# Patient Record
Sex: Male | Born: 1950 | Hispanic: No | Marital: Single | Smoking: Former smoker
Health system: Southern US, Community
[De-identification: ages and names within clinical notes are randomized; demographics above are authoritative.]

## PROBLEM LIST (undated history)

## (undated) DIAGNOSIS — K2270 Barrett's esophagus without dysplasia: Secondary | ICD-10-CM

## (undated) DIAGNOSIS — N184 Chronic kidney disease, stage 4 (severe): Secondary | ICD-10-CM

## (undated) DIAGNOSIS — K219 Gastro-esophageal reflux disease without esophagitis: Secondary | ICD-10-CM

## (undated) DIAGNOSIS — R609 Edema, unspecified: Secondary | ICD-10-CM

## (undated) DIAGNOSIS — M109 Gout, unspecified: Secondary | ICD-10-CM

## (undated) HISTORY — DX: Chronic kidney disease, stage 4 (severe): N18.4

## (undated) HISTORY — DX: Gout, unspecified: M10.9

---

## 2001-04-22 ENCOUNTER — Ambulatory Visit (HOSPITAL_COMMUNITY): Admission: RE | Admit: 2001-04-22 | Discharge: 2001-04-22 | Payer: Self-pay | Admitting: Gastroenterology

## 2001-04-22 ENCOUNTER — Encounter: Payer: Self-pay | Admitting: Gastroenterology

## 2008-10-28 ENCOUNTER — Emergency Department (HOSPITAL_COMMUNITY): Admission: EM | Admit: 2008-10-28 | Discharge: 2008-10-28 | Payer: Self-pay | Admitting: Family Medicine

## 2008-11-19 ENCOUNTER — Ambulatory Visit: Payer: Self-pay | Admitting: Internal Medicine

## 2008-11-19 ENCOUNTER — Encounter: Payer: Self-pay | Admitting: Internal Medicine

## 2008-11-19 DIAGNOSIS — I10 Essential (primary) hypertension: Secondary | ICD-10-CM

## 2008-11-19 DIAGNOSIS — N186 End stage renal disease: Secondary | ICD-10-CM

## 2008-11-24 LAB — CONVERTED CEMR LAB
ALT: 11 units/L (ref 0–53)
AST: 26 units/L (ref 0–37)
Albumin: 4.8 g/dL (ref 3.5–5.2)
Alkaline Phosphatase: 74 units/L (ref 39–117)
BUN: 27 mg/dL — ABNORMAL HIGH (ref 6–23)
Bacteria, UA: NONE SEEN
Basophils Absolute: 0 10*3/uL (ref 0.0–0.1)
Basophils Relative: 0 % (ref 0–1)
Bilirubin Urine: NEGATIVE
Bilirubin, Direct: <0.1 mg/dL (ref 0.0–0.3)
CO2: 23 meq/L (ref 19–32)
Calcium: 8.7 mg/dL (ref 8.4–10.5)
Chloride: 106 meq/L (ref 96–112)
Cholesterol: 181 mg/dL (ref 0–200)
Creatinine, Ser: 1.87 mg/dL — ABNORMAL HIGH (ref 0.40–1.50)
Eosinophils Absolute: 0.8 10*3/uL — ABNORMAL HIGH (ref 0.0–0.7)
Eosinophils Relative: 10 % — ABNORMAL HIGH (ref 0–5)
Free T4: 1.17 ng/dL (ref 0.80–1.80)
Glucose, Bld: 89 mg/dL (ref 70–99)
HCT: 37 % — ABNORMAL LOW (ref 39.0–52.0)
HDL: 43 mg/dL (ref >39)
Hemoglobin, Urine: NEGATIVE
Hemoglobin: 12.2 g/dL — ABNORMAL LOW (ref 13.0–17.0)
Ketones, ur: NEGATIVE mg/dL
LDL Cholesterol: 109 mg/dL — ABNORMAL HIGH (ref 0–99)
Leukocytes, UA: NEGATIVE
Lymphocytes Relative: 24 % (ref 12–46)
Lymphs Abs: 1.9 10*3/uL (ref 0.7–4.0)
MCHC: 33 g/dL (ref 30.0–36.0)
MCV: 79.7 fL (ref 78.0–100.0)
Monocytes Absolute: 0.5 10*3/uL (ref 0.1–1.0)
Monocytes Relative: 6 % (ref 3–12)
Neutro Abs: 4.7 10*3/uL (ref 1.7–7.7)
Neutrophils Relative %: 60 % (ref 43–77)
Nitrite: NEGATIVE
Phosphorus: 3.4 mg/dL (ref 2.3–4.6)
Platelets: 163 10*3/uL (ref 150–400)
Potassium: 4.8 meq/L (ref 3.5–5.3)
Protein, ur: 100 mg/dL — AB
RBC / HPF: NONE SEEN (ref <3)
RBC: 4.64 M/uL (ref 4.22–5.81)
RDW: 13.8 % (ref 11.5–15.5)
Sodium: 141 meq/L (ref 135–145)
Specific Gravity, Urine: 1.017 (ref 1.005–1.030)
TSH: 2.622 microintl units/mL (ref 0.350–4.500)
Total Bilirubin: 0.3 mg/dL (ref 0.3–1.2)
Total CHOL/HDL Ratio: 4.2
Total Protein: 8.3 g/dL (ref 6.0–8.3)
Triglycerides: 145 mg/dL (ref <150)
Urine Glucose: NEGATIVE mg/dL
Urobilinogen, UA: 0.2 (ref 0.0–1.0)
VLDL: 29 mg/dL (ref 0–40)
WBC, UA: NONE SEEN cells/hpf (ref <3)
WBC: 7.8 10*3/uL (ref 4.0–10.5)
pH: 6 (ref 5.0–8.0)

## 2008-11-25 ENCOUNTER — Ambulatory Visit (HOSPITAL_COMMUNITY): Admission: RE | Admit: 2008-11-25 | Discharge: 2008-11-25 | Payer: Self-pay | Admitting: Internal Medicine

## 2008-12-02 ENCOUNTER — Ambulatory Visit: Payer: Self-pay | Admitting: Internal Medicine

## 2008-12-02 ENCOUNTER — Encounter: Payer: Self-pay | Admitting: Internal Medicine

## 2008-12-03 ENCOUNTER — Encounter: Payer: Self-pay | Admitting: Internal Medicine

## 2008-12-06 LAB — CONVERTED CEMR LAB
BUN: 27 mg/dL — ABNORMAL HIGH (ref 6–23)
CO2: 28 meq/L (ref 19–32)
Calcium: 9.5 mg/dL (ref 8.4–10.5)
Chloride: 104 meq/L (ref 96–112)
Creatinine, Ser: 2 mg/dL — ABNORMAL HIGH (ref 0.40–1.50)
Glucose, Bld: 92 mg/dL (ref 70–99)
Potassium: 4.8 meq/L (ref 3.5–5.3)
Sed Rate: 77 mm/hr — ABNORMAL HIGH (ref 0–16)
Sodium: 142 meq/L (ref 135–145)
Uric Acid, Serum: 9.9 mg/dL — ABNORMAL HIGH (ref 4.0–7.8)

## 2009-01-13 ENCOUNTER — Ambulatory Visit: Payer: Self-pay | Admitting: Internal Medicine

## 2009-01-13 LAB — CONVERTED CEMR LAB
BUN: 40 mg/dL — ABNORMAL HIGH (ref 6–23)
CO2: 24 meq/L (ref 19–32)
Calcium: 9.3 mg/dL (ref 8.4–10.5)
Chloride: 105 meq/L (ref 96–112)
Creatinine, Ser: 2.05 mg/dL — ABNORMAL HIGH (ref 0.40–1.50)
Glucose, Bld: 86 mg/dL (ref 70–99)
Potassium: 4.4 meq/L (ref 3.5–5.3)
Sodium: 140 meq/L (ref 135–145)

## 2009-01-18 ENCOUNTER — Encounter: Payer: Self-pay | Admitting: Internal Medicine

## 2009-01-18 DIAGNOSIS — K029 Dental caries, unspecified: Secondary | ICD-10-CM | POA: Insufficient documentation

## 2009-02-10 ENCOUNTER — Ambulatory Visit: Payer: Self-pay | Admitting: Internal Medicine

## 2009-02-10 LAB — CONVERTED CEMR LAB

## 2009-02-16 ENCOUNTER — Ambulatory Visit: Payer: Self-pay | Admitting: Internal Medicine

## 2009-02-16 LAB — CONVERTED CEMR LAB
OCCULT 1: NEGATIVE
OCCULT 2: NEGATIVE
OCCULT 3: NEGATIVE

## 2009-02-18 ENCOUNTER — Encounter: Payer: Self-pay | Admitting: Internal Medicine

## 2009-02-18 LAB — CONVERTED CEMR LAB
BUN: 35 mg/dL — ABNORMAL HIGH (ref 6–23)
CO2: 31 meq/L (ref 19–32)
Calcium: 9.7 mg/dL (ref 8.4–10.5)
Chloride: 99 meq/L (ref 96–112)
Creatinine, Ser: 2.23 mg/dL — ABNORMAL HIGH (ref 0.40–1.50)
Glucose, Bld: 87 mg/dL (ref 70–99)
PSA: 0.59 ng/mL (ref 0.10–4.00)
Potassium: 4.4 meq/L (ref 3.5–5.3)
Sodium: 141 meq/L (ref 135–145)

## 2009-03-02 ENCOUNTER — Telehealth (INDEPENDENT_AMBULATORY_CARE_PROVIDER_SITE_OTHER): Payer: Self-pay | Admitting: *Deleted

## 2009-05-04 ENCOUNTER — Ambulatory Visit: Payer: Self-pay | Admitting: Infectious Diseases

## 2009-05-04 DIAGNOSIS — M109 Gout, unspecified: Secondary | ICD-10-CM

## 2009-05-04 DIAGNOSIS — M1030 Gout due to renal impairment, unspecified site: Secondary | ICD-10-CM | POA: Insufficient documentation

## 2009-05-04 HISTORY — DX: Gout, unspecified: M10.9

## 2009-05-04 LAB — CONVERTED CEMR LAB
Albumin: 4.1 g/dL (ref 3.5–5.2)
BUN: 34 mg/dL — ABNORMAL HIGH (ref 6–23)
CO2: 21 meq/L (ref 19–32)
Calcium: 8.9 mg/dL (ref 8.4–10.5)
Chloride: 110 meq/L (ref 96–112)
Creatinine, Ser: 2.41 mg/dL — ABNORMAL HIGH (ref 0.40–1.50)
Glucose, Bld: 97 mg/dL (ref 70–99)
Phosphorus: 2.6 mg/dL (ref 2.3–4.6)
Potassium: 4.4 meq/L (ref 3.5–5.3)
Sodium: 143 meq/L (ref 135–145)
Uric Acid, Serum: 5.9 mg/dL (ref 4.0–7.8)

## 2009-07-16 ENCOUNTER — Ambulatory Visit: Payer: Self-pay | Admitting: Internal Medicine

## 2009-07-21 LAB — CONVERTED CEMR LAB
ALT: <8 units/L (ref 0–53)
AST: 16 units/L (ref 0–37)
Albumin: 4.2 g/dL (ref 3.5–5.2)
Alkaline Phosphatase: 81 units/L (ref 39–117)
BUN: 27 mg/dL — ABNORMAL HIGH (ref 6–23)
CO2: 26 meq/L (ref 19–32)
Calcium: 8.7 mg/dL (ref 8.4–10.5)
Chloride: 100 meq/L (ref 96–112)
Creatinine, Ser: 1.8 mg/dL — ABNORMAL HIGH (ref 0.40–1.50)
Glucose, Bld: 84 mg/dL (ref 70–99)
Potassium: 4.3 meq/L (ref 3.5–5.3)
Sodium: 139 meq/L (ref 135–145)
Total Bilirubin: 0.4 mg/dL (ref 0.3–1.2)
Total Protein: 8 g/dL (ref 6.0–8.3)
Uric Acid, Serum: 6.5 mg/dL (ref 4.0–7.8)

## 2009-11-24 ENCOUNTER — Ambulatory Visit: Payer: Self-pay | Admitting: Internal Medicine

## 2009-11-25 ENCOUNTER — Encounter: Payer: Self-pay | Admitting: Internal Medicine

## 2009-11-25 LAB — CONVERTED CEMR LAB
Albumin: 4.4 g/dL (ref 3.5–5.2)
CO2: 23 meq/L (ref 19–32)
Calcium: 8.9 mg/dL (ref 8.4–10.5)
Glucose, Bld: 87 mg/dL (ref 70–99)
Potassium: 4.4 meq/L (ref 3.5–5.3)
Sodium: 143 meq/L (ref 135–145)
Total Protein: 8 g/dL (ref 6.0–8.3)

## 2009-12-02 ENCOUNTER — Telehealth: Payer: Self-pay | Admitting: Internal Medicine

## 2010-03-24 ENCOUNTER — Telehealth: Payer: Self-pay | Admitting: Internal Medicine

## 2010-03-29 ENCOUNTER — Ambulatory Visit: Payer: Self-pay | Admitting: Internal Medicine

## 2010-03-29 DIAGNOSIS — M25569 Pain in unspecified knee: Secondary | ICD-10-CM

## 2010-03-29 DIAGNOSIS — R609 Edema, unspecified: Secondary | ICD-10-CM

## 2010-03-30 ENCOUNTER — Encounter: Payer: Self-pay | Admitting: Internal Medicine

## 2010-04-04 LAB — CONVERTED CEMR LAB
Potassium: 4.6 meq/L (ref 3.5–5.3)
Sodium: 141 meq/L (ref 135–145)

## 2010-04-08 ENCOUNTER — Ambulatory Visit: Payer: Self-pay | Admitting: Internal Medicine

## 2010-04-08 LAB — CONVERTED CEMR LAB
CO2: 29 meq/L (ref 19–32)
Glucose, Bld: 84 mg/dL (ref 70–99)
Potassium: 4.3 meq/L (ref 3.5–5.3)
Sodium: 141 meq/L (ref 135–145)

## 2010-04-11 ENCOUNTER — Telehealth: Payer: Self-pay | Admitting: Ophthalmology

## 2010-05-11 ENCOUNTER — Encounter: Payer: Self-pay | Admitting: Ophthalmology

## 2010-05-18 ENCOUNTER — Ambulatory Visit: Payer: Self-pay | Admitting: Internal Medicine

## 2010-05-18 LAB — CONVERTED CEMR LAB
CO2: 31 meq/L (ref 19–32)
Calcium: 9.4 mg/dL (ref 8.4–10.5)
Creatinine, Ser: 2.19 mg/dL — ABNORMAL HIGH (ref 0.40–1.50)
Glucose, Bld: 92 mg/dL (ref 70–99)
Sodium: 140 meq/L (ref 135–145)

## 2010-06-21 NOTE — Assessment & Plan Note (Signed)
Summary: from [place]. nurse, BP up, BLE edema 1 to 2 +/pcp-madera/hla   Vital Signs:  Patient profile:   60 year old male Height:      64 inches (162.56 cm) Weight:      193.3 pounds (85.50 kg) BMI:     33.30 Temp:     97.4 degrees F (36.33 degrees C) oral Pulse rate:   83 / minute BP sitting:   141 / 82  (right arm) Cuff size:   regular  Vitals Entered By: Lucky Rathke NT II (March 29, 2010 4:09 PM) CC: PATIENT SPEAKS NO ENGLISH / PAIN IN RIGHT KNEE/ LE edema Is Patient Diabetic? No Pain Assessment Patient in pain? yes     Location: RIGHT KNEE Intensity:        3 Type: burning Onset of pain  FOR ABOUT 10 DAYS Nutritional Status BMI of > 30 = obese  Have you ever been in a relationship where you felt threatened, hurt or afraid?No   Does patient need assistance? Functional Status Self care Ambulation Normal   Primary Care Provider:  Barton Dubois MD  CC:  PATIENT SPEAKS NO ENGLISH / PAIN IN RIGHT KNEE/ LE edema.  History of Present Illness: This is a 60 year old [place] male with a history of HTN, renal insufficiency and gout who presents because his congregational nurse has noted elevated BP with BLE edema 1-2+.  The patient has had a slowly progressive increase in LE edema over the last 2-3 months and states that he has been taking his BP medications regularly and has not missed any doses.  Pt also denies a high salt diet.  The LE edema is bilateral and equal and Luke Dunn has not noted any redness or temperature differences between the legs.    Over about the same time period, the pt has also noted increased pain in his right knee.  The pain is constant, discribed as a 3/10 and is worsened by walking.  The pain is not worse at any time of day.  The patient denies any known trauma and has not noted erythema or edema of the joint.     Luke Dunn states that he has had HTN for the last 1.5 years and it has been poorly controlled.  The pts BP has ranged from the 123XX123 -  123XX123 systolic since January.  Elevated BP's have been attributed to poor adherence in the past.  Of note, the pts nurse does not take his bp after he has had time to rest and at his last elevated value (sbp approx: 160)  had taken the BP after he finished walking up a flight of stairs.   The patient denies any CP, SOB, chills, fever, weight change, or change in his BM's.        Preventive Screening-Counseling & Management  Alcohol-Tobacco     Alcohol drinks/day: 0     Smoking Status: quit  about 5 years ago.     Packs/Day: 1 pack per day.     Year Started: smoked as a teenager  Caffeine-Diet-Exercise     Does Patient Exercise: no  Problems Prior to Update: 1)  Gout  (ICD-274.9) 2)  Preventive Health Care  (ICD-V70.0) 3)  Dental Caries  (ICD-521.00) 4)  Foot Pain, Right  (ICD-729.5) 5)  Renal Insufficiency  (ICD-588.9) 6)  Essential Hypertension, Benign  (ICD-401.1)  Allergies: No Known Drug Allergies  Family History: Reviewed history from 11/24/2009 and no changes required. Unknown  Social History:  Reviewed history from 11/24/2009 and no changes required. 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   Physical Exam  General:  alert, well-developed, and well-hydrated.   Repeat BP: 112/62 Head:  normocephalic and atraumatic.   Eyes:  vision grossly intact, pupils equal, pupils round, and pupils reactive to light.   Mouth:  pharynx pink and moist.   Poor dentition.  Neck:  supple.   Lungs:  normal respiratory effort, normal breath sounds, no crackles, and no wheezes.   Heart:  normal rate, regular rhythm, no murmur, no gallop, no rub, and no JVD.   Abdomen:  soft, non-tender, normal bowel sounds, and no distention.   Msk:  There is diffuse mild joint tenderness along the joint margin of the right knee.   The knee has Full range of motion both activly and passivly.  There is no redness or effusion.  Pulses:  2+ dp/pt pulses bilaterally.  Extremities:  2+ pitting edema in the lower extremities bilaterally.  There is no erythema or increased warmth.  Skin:  No rashes.    Impression & Recommendations:  Problem # 1:  ESSENTIAL HYPERTENSION, BENIGN (ICD-401.1) Given that norvasc can cause/worsen LE edema I will discontinue it at this time and will initiate treatment of his BP with Coreg 12.5 mg two times a day.  I will also change his HCTZ to Lasix 20 mg daily to provide increased diuresis.  The patient will be seen back in one week for a nurse draw to check a BMET and we will follow up for a BP check in one month.   The following medications were removed from the medication list:    Amlodipine Besylate 10 Mg Tabs (Amlodipine besylate) .Marland Kitchen... Take 1 tablet by mouth once a day    Hydrochlorothiazide 50 Mg Tabs (Hydrochlorothiazide) .Marland Kitchen... Take 1 tablet by mouth every morning His updated medication list for this problem includes:    Coreg 12.5 Mg Tabs (Carvedilol) .Marland Kitchen... Take 1 tablet by mouth two times a day    Lasix 20 Mg Tabs (Furosemide) .Marland Kitchen... Take 1 tablet by mouth once a day  Problem # 2:  LEG EDEMA, BILATERAL (ICD-782.3) Pt has gained 5 ibs since July. The differential dx would include volume overload secondary to renal insufficiency, medication effect, chf (of which pt does not have  a  dx), or hypoalbuminemic states which seems unlikely at this time.  Will check a BMET today to rule out renal causes.   The patient has also been instructed to elevate his legas and wear compression stockings in AM.  If pts LE edema continues would recommend checking a BNP and an echo to check for diastolic dysfunction.  The following medications were removed from the medication list:    Hydrochlorothiazide 50 Mg Tabs (Hydrochlorothiazide) .Marland Kitchen... Take 1 tablet by mouth every morning His updated medication list for  this problem includes:    Lasix 20 Mg Tabs (Furosemide) .Marland Kitchen... Take 1 tablet by mouth once a day  Problem # 3:  RENAL INSUFFICIENCY (ICD-588.9) Likely secondary to hypertension, BMET last checked in july and creatinine was 1.71.  This is improved over his typical baseline.  Will recheck BMET today.   Orders: T-Basic Metabolic Panel (99991111)  Problem # 4:  KNEE PAIN, RIGHT (ICD-719.46)  This could be secondary to gout vs.  osteoarthritis.  At this point there are no warning signs and the pt is not in significant pain.  I do not feel that an x ray is warranted at this time.  We will treat with pain control using Voltaren jell which the pt has been instructed to use twice daily.  The patient was also instructed to contact us before taking any OTC pain medications.  Will follow up on pts knee pain in one month.    His updated medication list for this problem includes:    Tramadol Hcl 50 Mg Tabs (Tramadol hcl) .Marland Kitchen... Take one tablet by mouth every 8 horus as needed for pain  Problem # 5:  Preventive Health Care (ICD-V70.0)  Pt recieved flu shot today as well as a dental referal.   Orders: Dental Referral (Dentist) Flu Vaccine 28yrs + MEDICARE PATIENTS [id number])  Orders: Dental Referral (Dentist) Flu Vaccine 76yrs + MEDICARE PATIENTS [id number])  Complete Medication List: 1)  Tramadol Hcl 50 Mg Tabs (Tramadol hcl) .... Take one tablet by mouth every 8 horus as needed for pain 2)  Allopurinol 100 Mg Tabs (Allopurinol) .... Take 1 1/2 tablet by mouth once a day 3)  Coreg 12.5 Mg Tabs (Carvedilol) .... Take 1 tablet by mouth two times a day 4)  Lasix 20 Mg Tabs (Furosemide) .... Take 1 tablet by mouth once a day 5)  Voltaren 1 % Gel (Diclofenac sodium) .... Apply to knee two times daily for pain.  Other Orders: Influenza Vaccine NON MCR [id number])  Patient Instructions: 1)  Please do not take any over the counter pain medications without calling us.  We will see you back in 1 week so that we can  check your potassium and in 1 month to recheck your BP and check on your knee.  For your knee pain please use the Voltaren jell.  Please avoid salty foods.  Prescriptions: VOLTAREN 1 % GEL (DICLOFENAC SODIUM) apply to knee two times daily for pain.  #1 tube x 3   Entered and Authorized by:   Jola Schmidt MD   Signed by:   Jola Schmidt MD on 03/29/2010   Method used:   Print then Give to Patient   RxID:   [id number] LASIX 20 MG TABS (FUROSEMIDE) Take 1 tablet by mouth once a day  #30 x 2   Entered and Authorized by:   Jola Schmidt MD   Signed by:   Jola Schmidt MD on 03/29/2010   Method used:   Print then Give to Patient   RxID:   [id number] COREG 12.5 MG TABS (CARVEDILOL) Take 1 tablet by mouth two times a day  #60 x 2   Entered and Authorized by:   Jola Schmidt MD   Signed by:   Jola Schmidt MD on 03/29/2010   Method used:   Print then Give to Patient   RxID:   [id number]    Orders Added: 1)  T-Basic Metabolic Panel 0000000 2)  Est. Patient Level III [id number] 3)  Dental Referral [Dentist] 4)  Flu Vaccine 21yrs + MEDICARE PATIENTS [Q2039] 5)  Influenza Vaccine NON MCR [id number]   Flu Vaccine Consent Questions:    Do you have a history of severe allergic reactions to this vaccine? no    Any prior history of allergic reactions to egg and/or gelatin? no    Do you have a sensitivity to the preservative Thimersol? no    Do you have a past history of Guillan-Barre Syndrome? no    Do  you currently have an acute febrile illness? no    Have you ever had a severe reaction to latex? no    Vaccine information given and explained to patient? yes   Influenza Vaccine (to be given today) Process Orders Check Orders Results:     [facility]: ABN not required for this insurance Tests Sent for requisitioning (March 29, 2010 5:29 PM):     03/29/2010: [facility] -- T-Basic Metabolic Panel 0000000 (signed)    Prevention &  Chronic Care Immunizations   Influenza vaccine: Fluvax Non-MCR  (03/29/2010)   Influenza vaccine due: 01/20/2010    Tetanus booster: 05/04/2009: Tdap   Td booster deferral: Deferred  (02/10/2009)   Tetanus booster due: 05/05/2019    Pneumococcal vaccine: Not documented   Pneumococcal vaccine deferral: Deferred  (02/10/2009)    H. zoster vaccine: Not documented   H. zoster vaccine deferral: Deferred  (02/10/2009)  Colorectal Screening   Hemoccult: Not documented   Hemoccult action/deferral: Ordered  (02/10/2009)    Colonoscopy: Not documented  Other Screening   PSA: 0.59  (02/10/2009)   PSA action/deferral: Discussed-PSA requested  (02/10/2009)   Smoking status: quit  about 5 years ago.  (03/29/2010)  Lipids   Total Cholesterol: 181  (11/19/2008)   LDL: 109  (11/19/2008)   LDL Direct: Not documented   HDL: 43  (11/19/2008)   Triglycerides: 145  (11/19/2008)  Hypertension   Last Blood Pressure: 141 / 82  (03/29/2010)   Serum creatinine: 1.71  (11/25/2009)   BMP action: Ordered   Serum potassium 4.4  (11/25/2009)  Self-Management Support :   Personal Goals (by the next clinic visit) :      Personal blood pressure goal: 140/90  (11/24/2009)   Patient will work on the following items until the next clinic visit to reach self-care goals:     Medications and monitoring: take my medicines every day, bring all of my medications to every visit  (03/29/2010)     Eating: drink diet soda or water instead of juice or soda, eat more vegetables, use fresh or frozen vegetables, eat foods that are low in salt, eat baked foods instead of fried foods, eat fruit for snacks and desserts, limit or avoid alcohol  (03/29/2010)     Activity: take a 30 minute walk every day, take the stairs instead of the elevator  (03/29/2010)    Hypertension self-management support: Resources for patients handout  (03/29/2010)      Resource handout printed.

## 2010-06-21 NOTE — Progress Notes (Signed)
Summary: Results/B/P check  Phone Note Other Incoming   Caller: Berneta Sages Summary of Call: Call from the [facility] with pt's B/P.  Pt had not at his last visit been teen his B/P medication.  B/P was ordered.  Reading was 140/102 pt has been back on the B/P medication for 5 days now.  Skipped last "Sunday. Caolyn O'Brian can be reached at [phone] for results of labs. Initial call taken by: Gladys Herbin RN,  December 02, 2009 2:11 PM  Follow-up for Phone Call        Phone call completed. Increase HCTZ to 50 mg by mouth daily. Spoke with the patient's nurse Ms. O'Brian.She is to call with any concerns or questions. Follow-up by: Jameson Tormey MD,  December 07, 2009 3:07 PM    New/Updated Medications: HYDROCHLOROTHIAZIDE 25 MG TABS (HYDROCHLOROTHIAZIDE) Take 1 tablet by mouth every morning HYDROCHLOROTHIAZIDE 50 MG TABS (HYDROCHLOROTHIAZIDE) Take 1 tablet by mouth every morning Prescriptions: HYDROCHLOROTHIAZIDE 50 MG TABS (HYDROCHLOROTHIAZIDE) Take 1 tablet by mouth every morning  #30 x 11   Entered and Authorized by:   Alahna Dunne MD   Signed by:   Constantin Hillery MD on 12/07/2009   Method used:   Electronically to        [facility] (retail)       [address]       [place], [place]  [id number]       Ph: [phone]       Fax: [phone]   RxID:   [id number] HYDROCHLOROTHIAZIDE 25 MG TABS (HYDROCHLOROTHIAZIDE) Take 1 tablet by mouth every morning  #30 x 11   Entered and Authorized by:   Laniece Hornbaker MD   Signed by:   Bunyan Brier MD on 12/04/2009   Method used:   Electronically to        [facility] (retail)       [address], [place]  [id number]       Ph: [phone]       Fax: [phone]   RxID:   [id number]

## 2010-06-21 NOTE — Progress Notes (Signed)
Summary: appt/ hla  Phone Note Other Incoming   Summary of Call: per carolyn, congregational nurse, pt is having elevated bp's and BLE edema, appt is made for 11/8 per nurse request to make arrangments for pt being out of work , transportation and interpreter. carolyn will accompany pt. Initial call taken by: Freddy Finner RN,  March 24, 2010 2:53 PM  Follow-up for Phone Call        Thanks, for the info. Patient need to have his blood drawn for BMET in order to check his kidney function and electrolytes; will also need to have adjustment of his medications depending BP level and kidney fx.

## 2010-06-21 NOTE — Assessment & Plan Note (Signed)
Summary: acute-called Luke Dunn for inter/cfb   Vital Signs:  Patient profile:   60 year old male Height:      64 inches (162.56 cm) Weight:      192.1 pounds (87.32 kg) BMI:     33.09 Temp:     98.1 degrees F (36.72 degrees C) oral Pulse rate:   86 / minute BP sitting:   155 / 98  (right arm)  Vitals Entered By: Hilda Blades Ditzler RN (July 16, 2009 11:01 AM) Is Patient Diabetic? No Pain Assessment Patient in pain? no      Nutritional Status BMI of > 30 = obese Nutritional Status Detail appetite good  Have you ever been in a relationship where you felt threatened, hurt or afraid?denies   Does patient need assistance? Functional Status Self care Ambulation Normal Comments FU BP med and labs. Interpreter and sponsor with pt.   Primary Care Provider:  Barton Dubois MD   History of Present Illness: 60 year old male with pmh as described on the EMR. Who comes to the clinic for followup of his HTN, renal insufficiency and gout.  Pt reports doing well, denies any complaints or any recent swelling/pain of his joints. He is mostly everyday compliant with his medications and denies any major side effects from them. He had days that he missed his medications, but he admitted that he do not take his medications at the same time.  BP is 155/98  Depression History:      The patient denies a depressed mood most of the day and a diminished interest in his usual daily activities.        The patient denies that he feels like life is not worth living, denies that he wishes that he were dead, and denies that he has thought about ending his life.         Preventive Screening-Counseling & Management  Alcohol-Tobacco     Alcohol drinks/day: 0     Smoking Status: quit  about 5 years ago.     Packs/Day: 1 pack per day.     Year Started: smoked as a teenager  Caffeine-Diet-Exercise     Does Patient Exercise: no  Problems Prior to Update: 1)  Gout  (ICD-274.9) 2)  Preventive Health  Care  (ICD-V70.0) 3)  Dental Caries  (ICD-521.00) 4)  Foot Pain, Right  (ICD-729.5) 5)  Renal Insufficiency  (ICD-588.9) 6)  Essential Hypertension, Benign  (ICD-401.1)  Current Problems (verified): 1)  Gout  (ICD-274.9) 2)  Preventive Health Care  (ICD-V70.0) 3)  Dental Caries  (ICD-521.00) 4)  Foot Pain, Right  (ICD-729.5) 5)  Renal Insufficiency  (ICD-588.9) 6)  Essential Hypertension, Benign  (ICD-401.1)  Medications Prior to Update: 1)  Amlodipine Besylate 10 Mg Tabs (Amlodipine Besylate) .... Take 1 Tablet By Mouth Once A Day 2)  Tramadol Hcl 50 Mg Tabs (Tramadol Hcl) .... Take One Tablet By Mouth Every 8 Horus As Needed For Pain 3)  Uloric 40 Mg Tabs (Febuxostat) .... Take 1 Tablet By Mouth Once A Day 4)  Allopurinol 100 Mg Tabs (Allopurinol) .... Take 1 Tablet By Mouth Once A Day  Current Medications (verified): 1)  Amlodipine Besylate 10 Mg Tabs (Amlodipine Besylate) .... Take 1 Tablet By Mouth Once A Day 2)  Tramadol Hcl 50 Mg Tabs (Tramadol Hcl) .... Take One Tablet By Mouth Every 8 Horus As Needed For Pain 3)  Allopurinol 100 Mg Tabs (Allopurinol) .... Take 1 Tablet By Mouth Once A Day  Allergies (verified):  No Known Drug Allergies  Past History:  Risk Factors: Alcohol Use: 0 (07/16/2009) Exercise: no (07/16/2009)  Risk Factors: Smoking Status: quit  about 5 years ago. (07/16/2009) Packs/Day: 1 pack per day. (07/16/2009)  Review of Systems  The patient denies anorexia, fever, chest pain, syncope, dyspnea on exertion, prolonged cough, headaches, hemoptysis, abdominal pain, melena, hematochezia, severe indigestion/heartburn, hematuria, incontinence, and muscle weakness.    Physical Exam  General:  alert, well-developed, and overweight-appearing. In no acute distress.  Lungs:  Normal respiratory effort, chest expands symmetrically. Lungs are clear to auscultation, no crackles or wheezes. Heart:  Normal rate and regular rhythm. S1 and S2 normal without gallop,  murmur, click, rub or other extra sounds. Abdomen:  Bowel sounds positive,abdomen soft and non-tender without masses, organomegaly or hernias noted. Msk:  normal ROM, no joint tenderness, no joint swelling, no redness over joints, and no crepitation.   Extremities:  trace of edema bilateral, good pulses, no cyanosis and no tenderness. Neurologic:  No cranial nerve deficits noted. Station and gait are normal. Sensory, motor and coordinative functions appear intact.   Impression & Recommendations:  Problem # 1:  GOUT (ICD-274.9) Uric acid 6.5 now; goal is to achieved uric acid level of less than 6. Will increased his allopurinol to 150mg  daily and will discontinue his uloric (patient unable to pay for it anyway). Will continue using tramadol as needed for pain. Patient advised of food that can make him flare. Cr was 1.8; patient advised to keep himself hydrated.  The following medications were removed from the medication list:    Uloric 40 Mg Tabs (Febuxostat) .Marland Kitchen... Take 1 tablet by mouth once a day His updated medication list for this problem includes:    Allopurinol 100 Mg Tabs (Allopurinol) .Marland Kitchen... Take 1 tablet by mouth once a day  Orders: T-Uric Acid (Blood) [id number])  Problem # 2:  ESSENTIAL HYPERTENSION, BENIGN (ICD-401.1) BP essentially at goal. Will continue current regimen and will advised him to follow a low sodium diet and to be compliant with his medications everyday and also to try to take it at the same time (in order to minimize BP fluctuation).  His updated medication list for this problem includes:    Amlodipine Besylate 10 Mg Tabs (Amlodipine besylate) .Marland Kitchen... Take 1 tablet by mouth once a day  Orders: T-Comprehensive Metabolic Panel ([id number])  Problem # 3:  DENTAL CARIES (ICD-521.00) Patient with really bad dental caries. We have instructed to brush his teeth properly, to use peroxide mouthwash and have give information of dental clinic in [place] in order to see  if he received early attention. Patient was placed in list for dental referral in [place] as well (but waiting list is big). There is not active or acute infection at this point.  Problem # 4:  RENAL INSUFFICIENCY (ICD-588.9) Cr slowly improving. Will continue controlling his BP, will keep patient hydrated and will monitorzed his renal function closely. Patient advised to follow a low sodium and low protein diet.  Complete Medication List: 1)  Amlodipine Besylate 10 Mg Tabs (Amlodipine besylate) .... Take 1 tablet by mouth once a day 2)  Tramadol Hcl 50 Mg Tabs (Tramadol hcl) .... Take one tablet by mouth every 8 horus as needed for pain 3)  Allopurinol 100 Mg Tabs (Allopurinol) .... Take 1 tablet by mouth once a day  Patient Instructions: 1)  Please schedule a follow-up appointment in 4 months. 2)  Take your medications as prescribed and if possible at the sametime everyday. 3)  Keep yourself hydrated. 4)  To prevent gout attacks,avoid purine rich foods, such as beer, beans & peas, and meat gravies. 5)  You will be called with any abnormalities in the tests scheduled or performed today.  6)  Limit your Sodium to less than 2500 grams a day (slightly less than 1/2  tablespoon) to helpcontrolling your blood pressure. Process Orders Check Orders Results:     [facility]: [id number] not required for this insurance Tests Sent for requisitioning (July 21, 2009 3:28 PM):     07/16/2009: [facility] -- T-Comprehensive Metabolic Panel [id number] (signed)     07/16/2009: [facility] -- T-Uric Acid (Blood) [id number] (signed)    Prevention & Chronic Care Immunizations   Influenza vaccine: Fluvax Non-MCR  (02/10/2009)   Influenza vaccine due: 01/20/2010    Tetanus booster: 05/04/2009: Tdap   Td booster deferral: Deferred  (02/10/2009)   Tetanus booster due: 05/05/2019    Pneumococcal vaccine: Not documented   Pneumococcal vaccine deferral: Deferred   (02/10/2009)    H. zoster vaccine: Not documented   H. zoster vaccine deferral: Deferred  (02/10/2009)  Colorectal Screening   Hemoccult: Not documented   Hemoccult action/deferral: Ordered  (02/10/2009)    Colonoscopy: Not documented  Other Screening   PSA: 0.59  (02/10/2009)   PSA action/deferral: Discussed-PSA requested  (02/10/2009)   Smoking status: quit  about 5 years ago.  (07/16/2009)  Lipids   Total Cholesterol: 181  (11/19/2008)   LDL: 109  (11/19/2008)   LDL Direct: Not documented   HDL: 43  (11/19/2008)   Triglycerides: 145  (11/19/2008)  Hypertension   Last Blood Pressure: 155 / 98  (07/16/2009)   Serum creatinine: 2.41  (05/04/2009)   BMP action: Ordered   Serum potassium 4.4  (05/04/2009) CMP ordered   Self-Management Support :    Patient will work on the following items until the next clinic visit to reach self-care goals:     Medications and monitoring: take my medicines every day, check my blood pressure, bring all of my medications to every visit, weigh myself weekly  (07/16/2009)     Eating: drink diet soda or water instead of juice or soda, eat more vegetables, use fresh or frozen vegetables, eat foods that are low in salt, eat baked foods instead of fried foods, eat fruit for snacks and desserts, limit or avoid alcohol  (07/16/2009)     Activity: take a 30 minute walk every day, take the stairs instead of the elevator  (07/16/2009)    Hypertension self-management support: Education handout, Written self-care plan, Resources for patients handout  (07/16/2009)   Hypertension self-care plan printed.   Hypertension education handout printed      Resource handout printed.

## 2010-06-21 NOTE — Assessment & Plan Note (Signed)
Summary: EST-CK/FU/MEDS/CFB CALLED [facility]ANN[facility] FOR [facility] INTER/CFB   Vital Signs:  Patient profile:   60 year old male Height:      64 inches (162.56 cm) Weight:      188.1 pounds (85.50 kg) BMI:     32.40 Temp:     97.3 degrees F (36.28 degrees C) oral Pulse rate:   79 / minute BP sitting:   184 / 107  (right arm)  Vitals Entered By: Hilda Blades Ditzler RN (November 24, 2009 4:39 PM) Is Patient Diabetic? No Pain Assessment Patient in pain? yes     Location: right foot Intensity: 8 Type: sharp Onset of pain  past week Nutritional Status BMI of > 30 = obese Nutritional Status Detail appetite good  Have you ever been in a relationship where you felt threatened, hurt or afraid?denies   Does patient need assistance? Functional Status Self care Ambulation Normal Comments Interpreter with pt. Out of meds x 2 months. Refills on meds.   Primary Care Provider:  Barton Dubois MD   History of Present Illness: Follow up for his: 1.HTN - out of his meds for 2 months. 2.R giht foot 2nd MT joint pain 2/2 gout.  Depression History:      The patient denies a depressed mood most of the day and a diminished interest in his usual daily activities.         Preventive Screening-Counseling & Management  Alcohol-Tobacco     Alcohol drinks/day: 0     Smoking Status: quit  about 5 years ago.     Packs/Day: 1 pack per day.     Year Started: smoked as a teenager  Caffeine-Diet-Exercise     Does Patient Exercise: no  Problems Prior to Update: 1)  Gout  (ICD-274.9) 2)  Preventive Health Care  (ICD-V70.0) 3)  Dental Caries  (ICD-521.00) 4)  Foot Pain, Right  (ICD-729.5) 5)  Renal Insufficiency  (ICD-588.9) 6)  Essential Hypertension, Benign  (ICD-401.1)  Current Problems (verified): 1)  Gout  (ICD-274.9) 2)  Preventive Health Care  (ICD-V70.0) 3)  Dental Caries  (ICD-521.00) 4)  Foot Pain, Right  (ICD-729.5) 5)  Renal Insufficiency  (ICD-588.9) 6)  Essential Hypertension, Benign   (ICD-401.1)  Medications Prior to Update: 1)  Amlodipine Besylate 10 Mg Tabs (Amlodipine Besylate) .... Take 1 Tablet By Mouth Once A Day 2)  Tramadol Hcl 50 Mg Tabs (Tramadol Hcl) .... Take One Tablet By Mouth Every 8 Horus As Needed For Pain 3)  Allopurinol 100 Mg Tabs (Allopurinol) .... Take 1 1/2 Tablet By Mouth Once A Day  Allergies (verified): No Known Drug Allergies  Past History:  Risk Factors: Smoking Status: quit  about 5 years ago. (11/24/2009) Packs/Day: 1 pack per day. (11/24/2009)  Past medical, surgical, family and social histories (including risk factors) reviewed for relevance to current acute and chronic problems.  Family History: Reviewed history and no changes required. Unknown  Social History: Reviewed history and no changes required. 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 No apparent alopecia or balding. Eyes:  No corneal or conjunctival inflammation noted. EOMI. Perrla. Funduscopic exam benign, without hemorrhages, exudates or papilledema. Vision grossly normal. Ears:  External ear  exam shows no significant lesions or deformities.  Otoscopic examination reveals clear canals, tympanic membranes are intact bilaterally without bulging, retraction, inflammation or discharge. Hearing is grossly normal bilaterally. Nose:  External nasal examination shows no deformity or inflammation. Nasal mucosa are pink and moist without lesions or exudates. Mouth:  Oral mucosa and oropharynx without lesions or exudates.  Teeth in good repair. Neck:  No deformities, masses, or tenderness noted. Lungs:  Normal respiratory  effort, chest expands symmetrically. Lungs are clear to auscultation, no crackles or wheezes. Heart:  Normal rate and regular rhythm. S1 and S2 normal without gallop, murmur, click, rub or other extra sounds. Abdomen:  Bowel sounds positive,abdomen soft and non-tender without masses, organomegaly or hernias noted. Rectal:  declined Genitalia:  declinedd Prostate:  declined Msk:  R foot 1st MP joint erythmea, edema and increased warmth with touch; TTP. No abnormalities to other joints. Pulses:  R and L carotid,radial,femoral,dorsalis pedis and posterior tibial pulses are full and equal bilaterally Extremities:  No clubbing, cyanosis, edema, or deformity noted with normal full range of motion of all joints.   Neurologic:  No cranial nerve deficits noted. Station and gait are normal. Plantar reflexes are down-going bilaterally. DTRs are symmetrical throughout. Sensory, motor and coordinative functions appear intact. Skin:  Intact without suspicious lesions or rashes Cervical Nodes:  No lymphadenopathy noted Axillary Nodes:  No palpable lymphadenopathy Inguinal Nodes:  no R inguinal adenopathy and no L inguinal adenopathy.   Psych:  Cognition and judgment appear intact. Alert and cooperative with normal attention span and concentration. No apparent delusions, illusions, hallucinations   Impression & Recommendations:  Problem # 1:  GOUT (ICD-274.9)  Patient is instructed to restart allopurinol ONLY after an acute flare up is subsided. Prednisone taper for gout. Avoid NSAIDs due to CRI. May consider Colchicine. His updated medication list for this problem includes:    Allopurinol 100 Mg Tabs (Allopurinol) .Marland Kitchen... Take 1 1/2 tablet by mouth once a day  Elevate extremity; warm compresses, symptomatic relief and medication as directed.   Problem # 2:  ESSENTIAL HYPERTENSION, BENIGN (ICD-401.1) Uncontrolled due to noncompliance. Risks of HTN thoroughly discussed -patient verbalized understanding.  Restart Amlodipine. RTC in 3 days for BP recheck. B-met repeated -Cr is at 1.71 (no change since previous result). His updated medication list for this problem includes:    Amlodipine Besylate 10 Mg Tabs (Amlodipine besylate) .Marland Kitchen... Take 1 tablet by mouth once a day  Orders: 12 Lead EKG (12 Lead EKG) Ophthalmology Referral (Ophthalmology)  BP today: 184/107 Prior BP: 155/98 (07/16/2009)  Labs Reviewed: K+: 4.3 (07/16/2009) Creat: : 1.80 (07/16/2009)   Chol: 181 (11/19/2008)   HDL: 43 (11/19/2008)   LDL: 109 (11/19/2008)   TG: 145 (11/19/2008)  Problem # 3:  RENAL INSUFFICIENCY (ICD-588.9) Likely 2/2 uncontrolled HTN. AGain, compliance with anti-HTN regimen strongly emphasized. Consider MM work up. Orders: T-Comprehensive Metabolic Panel ([id number])  Problem # 4:  PREVENTIVE HEALTH CARE (ICD-V70.0)  Orders: T-Hemoccult Cards-Multiple ([id number])Future Orders: T-Lipid Profile [id number]) ... 12/08/2009  Reviewed preventive care protocols, scheduled due services, and updated immunizations.  Complete Medication List: 1)  Amlodipine Besylate 10 Mg Tabs (Amlodipine besylate) .... Take 1 tablet by mouth once a day 2)  Tramadol Hcl 50 Mg Tabs (Tramadol hcl) .... Take one tablet by mouth every 8 horus as needed for pain 3)  Allopurinol 100 Mg Tabs (Allopurinol) .... Take 1 1/2 tablet by mouth once a day 4)  Prednisone 10 Mg Tabs (Prednisone) .... Take 6 tablets by mouth  for 2 days, 5 tablets for 2 days, 4 tablets for 2 days, 3 tablets for 2 days, 2 tablets for 2 days, 1 tablet for 2 days, then stop.  Patient Instructions: 1)  please, take all your medications as prescribed. Call with any questions. 2)  Follow up with Dr. Dyann Kief in 3-6 months or sooner. 3)  please, call with your blood pressure reading!!!!! Prescriptions: ALLOPURINOL 100 MG TABS (ALLOPURINOL) Take 1 1/2 tablet by mouth once a day  #180 x 3   Entered and Authorized by:   Milana Obey MD   Signed by:   Milana Obey  MD on 11/24/2009   Method used:   Faxed to ...       [facility] (retail)       [address], [place]  [id number]       Ph: [phone]       Fax: [phone]   RxID:   [id number] TRAMADOL HCL 50 MG TABS (TRAMADOL HCL) Take one tablet by mouth every 8 horus as needed for pain  #90 x 3   Entered and Authorized by:   Milana Obey MD   Signed by:   Milana Obey MD on 11/24/2009   Method used:   Faxed to ...       [facility] (retail)       [address], [place]  [id number]       Ph: [phone]       Fax: [phone]   RxIDIT:[id number] AMLODIPINE BESYLATE 10 MG TABS (AMLODIPINE BESYLATE) Take 1 tablet by mouth once a day  #100 x 3   Entered and Authorized by:   Milana Obey MD   Signed by:   Milana Obey MD on 11/24/2009   Method used:   Faxed to ...       [facility] (retail)       [address], [place]  [id number]       Ph: [phone]       Fax: [phone]   RxID:   [id number] PREDNISONE 10 MG TABS (PREDNISONE) Take 6 tablets by mouth for 2 days, 5 tablets for 2 days, 4 tablets for 2 days, 3 tablets for 2 days, 2 tablets for 2 days, 1 tablet for 2 days, then stop.  #42 x 0   Entered and Authorized by:   Milana Obey MD   Signed by:   Milana Obey MD on 11/24/2009   Method used:   Faxed to ...       [facility] (retail)       [address], [place]  [id number]       Ph: [phone]       Fax: [phone]   RxID:   [id number]  Process Orders Check Orders Results:     [facility]: Order checked:      -- T-Hemoccult Cards-Multiple --  NO TEST CODE (CPT: ) Tests Sent for requisitioning (November 27, 2009 11:42 AM):     11/24/2009: [facility] -- T-Comprehensive Metabolic Panel [id number] (signed)     12/08/2009: [facility] --  T-Lipid Profile [phone] (signed)     11/24/2009: [facility] -- T-Hemoccult Cards-Multiple [phone] (signed)    Prevention & Chronic Care Immunizations  Influenza vaccine: Fluvax Non-MCR  (02/10/2009)   Influenza vaccine due: 01/20/2010    Tetanus booster: 05/04/2009: Tdap   Td booster deferral: Deferred  (02/10/2009)   Tetanus booster due: 05/05/2019    Pneumococcal vaccine: Not documented   Pneumococcal vaccine deferral: Deferred  (02/10/2009)    H. zoster vaccine: Not documented   H. zoster vaccine deferral: Deferred  (02/10/2009)  Colorectal Screening   Hemoccult: Not documented   Hemoccult action/deferral: Ordered  (02/10/2009)    Colonoscopy: Not documented  Other Screening   PSA: 0.59  (02/10/2009)   PSA action/deferral: Discussed-PSA requested  (02/10/2009)   Smoking status: quit  about 5 years ago.  (11/24/2009)  Lipids   Total Cholesterol: 181  (11/19/2008)   LDL: 109  (11/19/2008)   LDL Direct: Not documented   HDL: 43  (11/19/2008)   Triglycerides: 145  (11/19/2008)  Hypertension   Last Blood Pressure: 184 / 107  (11/24/2009)   Serum creatinine: 1.80  (07/16/2009)   BMP action: Ordered   Serum potassium 4.3  (07/16/2009) CMP ordered   Self-Management Support :   Personal Goals (by the next clinic visit) :      Personal blood pressure goal: 140/90  (11/24/2009)   Patient will work on the following items until the next clinic visit to reach self-care goals:     Medications and monitoring: check my blood pressure, bring all of my medications to every visit, weigh myself weekly  (11/24/2009)     Eating: drink diet soda or water instead of juice or soda, eat more vegetables, use fresh or frozen vegetables, eat foods that are low in salt, eat baked foods instead of fried foods, eat fruit for snacks and desserts  (11/24/2009)     Activity: take a 30 minute walk every day, take the stairs instead of the elevator  (11/24/2009)     Hypertension self-management support: Written self-care plan, Education handout, Resources for patients handout  (11/24/2009)   Hypertension self-care plan printed.   Hypertension education handout printed      Resource handout printed.

## 2010-06-21 NOTE — Progress Notes (Signed)
Summary: Results  Phone Note Call from Patient   Caller: Luke Dunn Call For: Jola Schmidt MD Summary of Call: Call from Luke Dunn Nurse for pt.  Asked if pt's results could be called to her since pt does not have an interpreter.  She will get orders or results to pt. [phone]. Initial call taken by: Sander Nephew RN,  April 11, 2010 4:31 PM  Follow-up for Phone Call        Nurse was contacted and message left informing her that the patients renal function was slightly improved from prior value and that it was in line with his previous levels.  No changes were adviced at this point in time but nurse was told to call back if she has any further questions.  Follow-up by: Jola Schmidt MD,  April 13, 2010 7:56 AM

## 2010-06-23 NOTE — Assessment & Plan Note (Signed)
Summary: EST-1 MONTH F/U VISIT/CH   Vital Signs:  Patient profile:   60 year old male Height:      64 inches (162.56 cm) Weight:      192.7 pounds (87.59 kg) BMI:     33.20 Temp:     97.2 degrees F (36.22 degrees C) oral Pulse rate:   76 / minute BP sitting:   142 / 87  (left arm) Cuff size:   regular  Vitals Entered By: Lucky Rathke NT II (May 18, 2010 1:51 PM) CC: MEDICATION REFILL / NO CONCERNS NOR COMPLAINTS Is Patient Diabetic? No Pain Assessment Patient in pain? no      Nutritional Status BMI of > 30 = obese  Have you ever been in a relationship where you felt threatened, hurt or afraid?No   Primary Care Provider:  Barton Dubois MD  CC:  MEDICATION REFILL / NO CONCERNS NOR COMPLAINTS.  History of Present Illness: This is a 60 year old [place] male with hx of htn, renal insuffiency and gout who presents for a follow up visit. Pt last seen by me on 04/08/10, and at that time, he was complaining of LE edema and elevated BP's.   Pts norvasc was stopped and he was started on Coreg, and HCTZ was changed to Lasix.  Pt was also complaining of knee pain though this was felt to be secondary to osteoarthritis and voltaren gel was initiated.  Pt is tolerating medications well. LE Edema has resolved, and pt is no longer requiring the gel as he currently denies any knee pain.   Pt denies any CP, SOB, abdominal pain, change in his BM's or fatigue.    Preventive Screening-Counseling & Management  Alcohol-Tobacco     Alcohol drinks/day: 0     Smoking Status: quit  about 5 years ago.     Packs/Day: 1 pack per day.     Year Started: smoked as a teenager  Caffeine-Diet-Exercise     Does Patient Exercise: no  Problems Prior to Update: 1)  Knee Pain, Right  (ICD-719.46) 2)  Leg Edema, Bilateral  (ICD-782.3) 3)  Gout  (ICD-274.9) 4)  Preventive Health Care  (ICD-V70.0) 5)  Dental Caries  (ICD-521.00) 6)  Renal Insufficiency  (ICD-588.9) 7)  Essential Hypertension,  Benign  (ICD-401.1)  Allergies: No Known Drug Allergies  Family History: Reviewed history from 11/24/2009 and no changes required. Unknown  Social History: Reviewed history from 11/24/2009 and no changes required. 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   Physical Exam  General:  alert and well-developed.  alert and well-developed. Repeat BP: 126/74 Head:  normocephalic and atraumatic.   Eyes:  vision grossly intact, pupils equal, pupils round, and pupils reactive to light.  Nose:  no nasal discharge.   Mouth:  pharynx pink and moist.  Neck:  supple.  Lungs:  normal respiratory effort, normal breath sounds, no crackles, and no wheezes.   Heart:  normal rate, regular rhythm, no murmur, no gallop, no rub, and no JVD.   Abdomen:  soft, non-tender, normal bowel sounds, no distention, no masses, and no guarding.   Msk:  normal ROM, no joint tenderness, no joint swelling, and no joint warmth.  normal ROM, no joint tenderness, no joint swelling, and no joint warmth.   Pulses:  2+ radial pulses  bilaterally.  Extremities:  No edema   Impression & Recommendations:  Problem # 1:  ESSENTIAL HYPERTENSION, BENIGN (ICD-401.1) Repeat BP within goal today will continue pt on current regimen.  I refilled pts coreg and lasix today. Will recheck BMET today.  His updated medication list for this problem includes:    Coreg 12.5 Mg Tabs (Carvedilol) .Marland Kitchen... Take 1 tablet by mouth two times a day    Lasix 20 Mg Tabs (Furosemide) .Marland Kitchen... Take 1 tablet by mouth once a day  Orders: T-Basic Metabolic Panel ([id number])  Problem # 2:  LEG EDEMA, BILATERAL (ICD-782.3) Resolved.  Will continue Lasix at current dose.  His updated medication list for this problem includes:    Lasix 20 Mg Tabs (Furosemide) .Marland Kitchen... Take 1 tablet by mouth once a  day  Problem # 3:  KNEE PAIN, RIGHT (ICD-719.46) Resolved. Likely osteoarthritis Pt has voltaren gel remaining but is no longer requiring it.    His updated medication list for this problem includes:    Tramadol Hcl 50 Mg Tabs (Tramadol hcl) .Marland Kitchen... Take one tablet by mouth every 8 horus as needed for pain  Problem # 4:  RENAL INSUFFICIENCY (ICD-588.9) Last creatinine was 1.93 which is his baseline, will continue to monitor and recheck BMET today.      Problem # 5:  PREVENTIVE HEALTH CARE (ICD-V70.0) Discussed colorectal screening with pt today but he declined both colonoscopy and stool cards.  Pts last colonoscopy was in 2001 and he is unsure of the results but does not think there were any abnormalities.  Complete Medication List: 1)  Tramadol Hcl 50 Mg Tabs (Tramadol hcl) .... Take one tablet by mouth every 8 horus as needed for pain 2)  Allopurinol 100 Mg Tabs (Allopurinol) .... Take 1 1/2 tablet by mouth once a day 3)  Coreg 12.5 Mg Tabs (Carvedilol) .... Take 1 tablet by mouth two times a day 4)  Lasix 20 Mg Tabs (Furosemide) .... Take 1 tablet by mouth once a day 5)  Voltaren 1 % Gel (Diclofenac sodium) .... Apply to knee two times daily for pain.  Patient Instructions: 1)  Continue your current medications and I will see you for a repeat visit in 3 months.  Please call the clinic if you have any problems or questions. Prescriptions: ALLOPURINOL 100 MG TABS (ALLOPURINOL) Take 1 1/2 tablet by mouth once a day  #180 x 3   Entered and Authorized by:   Jola Schmidt MD   Signed by:   Jola Schmidt MD on 05/18/2010   Method used:   Electronically to        [facility] (retail)       [address], [place]  [id number]       Ph: [phone]       Fax: [phone]   RxID:   [id number] LASIX 20 MG TABS (FUROSEMIDE) Take 1 tablet by mouth once a day  #30 x 11   Entered and Authorized by:   Jola Schmidt MD   Signed by:   Jola Schmidt MD  on 05/18/2010   Method used:   Electronically to        [facility] (retail)       [address], [place]  [id number]       Ph: [phone]       Fax: [phone]   RxID:   [id number] COREG 12.5  MG TABS (CARVEDILOL) Take 1 tablet by mouth two times a day  #60 x 11   Entered and Authorized by:   Jola Schmidt MD   Signed by:   Jola Schmidt MD on 05/18/2010   Method used:   Electronically to        [facility] (retail)       [address], [place]  [id number]       Ph: [phone]       Fax: [phone]   RxID:   [id number]    Orders Added: 1)  Est. Patient Level III [id number] 2)  T-Basic Metabolic Panel [id number]   Process Orders Check Orders Results:     [facility]: [id number] not required for this insurance Tests Sent for requisitioning (May 18, 2010 2:18 PM):     05/18/2010: [facility] -- T-Basic Metabolic Panel 0000000 (signed)     Prevention & Chronic Care Immunizations   Influenza vaccine: Fluvax Non-MCR  (03/29/2010)   Influenza vaccine due: 01/20/2010    Tetanus booster: 05/04/2009: Tdap   Td booster deferral: Deferred  (02/10/2009)   Tetanus booster due: 05/05/2019    Pneumococcal vaccine: Not documented   Pneumococcal vaccine deferral: Deferred  (02/10/2009)    H. zoster vaccine: Not documented   H. zoster vaccine deferral: Deferred  (02/10/2009)  Colorectal Screening   Hemoccult: Not documented   Hemoccult action/deferral: Ordered  (02/10/2009)    Colonoscopy: Not documented  Other Screening   PSA: 0.59  (02/10/2009)   PSA action/deferral: Discussed-PSA requested  (02/10/2009)   Smoking status: quit  about 5 years ago.  (05/18/2010)  Lipids   Total Cholesterol: 181  (11/19/2008)   LDL: 109  (11/19/2008)   LDL Direct: Not documented   HDL: 43  (11/19/2008)   Triglycerides: 145  (11/19/2008)  Hypertension    Last Blood Pressure: 142 / 87  (05/18/2010)   Serum creatinine: 1.93  (04/08/2010)   BMP action: Ordered   Serum potassium 4.3  (04/08/2010)  Self-Management Support :   Personal Goals (by the next clinic visit) :      Personal blood pressure goal: 140/90  (11/24/2009)   Patient will work on the following items until the next clinic visit to reach self-care goals:     Medications and monitoring: take my medicines every day  (05/18/2010)     Eating: drink diet soda or water instead of juice or soda, eat more vegetables, use fresh or frozen vegetables, eat foods that are low in salt, eat baked foods instead of fried foods, eat fruit for snacks and desserts, limit or avoid alcohol  (05/18/2010)     Activity: take a 30 minute walk every day  (05/18/2010)    Hypertension self-management support: Resources for patients handout  (05/18/2010)      Resource handout printed.  Appended Document: EST-1 MONTH F/U VISIT/CH I have discussed the care of this patient in detail with the resident and agree fully with the documentation completed.

## 2010-06-23 NOTE — Consult Note (Signed)
[facility]   Imported By: Garlan Fillers 06/01/2010 10:27:47  _____________________________________________________________________  External Attachment:    Type:   Image     Comment:   External Document

## 2010-07-25 ENCOUNTER — Encounter: Payer: Self-pay | Admitting: Ophthalmology

## 2010-08-23 ENCOUNTER — Ambulatory Visit: Payer: Self-pay | Admitting: Ophthalmology

## 2010-08-23 ENCOUNTER — Ambulatory Visit (INDEPENDENT_AMBULATORY_CARE_PROVIDER_SITE_OTHER): Payer: Self-pay | Admitting: Internal Medicine

## 2010-08-23 ENCOUNTER — Encounter: Payer: Self-pay | Admitting: Internal Medicine

## 2010-08-23 DIAGNOSIS — N259 Disorder resulting from impaired renal tubular function, unspecified: Secondary | ICD-10-CM

## 2010-08-23 DIAGNOSIS — M109 Gout, unspecified: Secondary | ICD-10-CM

## 2010-08-23 DIAGNOSIS — E876 Hypokalemia: Secondary | ICD-10-CM

## 2010-08-23 DIAGNOSIS — I10 Essential (primary) hypertension: Secondary | ICD-10-CM

## 2010-08-23 DIAGNOSIS — R609 Edema, unspecified: Secondary | ICD-10-CM

## 2010-08-23 LAB — BASIC METABOLIC PANEL
BUN: 26 mg/dL — ABNORMAL HIGH (ref 6–23)
Calcium: 9 mg/dL (ref 8.4–10.5)
Creat: 1.84 mg/dL — ABNORMAL HIGH (ref 0.40–1.50)

## 2010-08-23 MED ORDER — CARVEDILOL 12.5 MG PO TABS: 12.5000 mg | ORAL_TABLET | Freq: Two times a day (BID) | ORAL | Status: DC

## 2010-08-23 MED ORDER — FUROSEMIDE 20 MG PO TABS: 20.0000 mg | ORAL_TABLET | Freq: Two times a day (BID) | ORAL | Status: DC

## 2010-08-23 MED ORDER — ALLOPURINOL 100 MG PO TABS: 100.0000 mg | ORAL_TABLET | Freq: Every day | ORAL | Status: DC

## 2010-08-23 MED ORDER — POTASSIUM CHLORIDE 10 MEQ PO TBCR: 10.0000 meq | EXTENDED_RELEASE_TABLET | Freq: Every day | ORAL | Status: DC

## 2010-08-23 MED ORDER — LISINOPRIL 5 MG PO TABS: 5.0000 mg | ORAL_TABLET | Freq: Every day | ORAL | Status: DC

## 2010-08-23 NOTE — Progress Notes (Signed)
  Subjective:    Patient ID: Luke Dunn, male    DOB: November 24, 1950, 60 y.o.   MRN: [id number]  HPI Patient is 60 year old [place] speaking male here with an interpreter and caregiver, presents to the outpatient clinic for routine followup, patient today has no new complaints, patient has been out of some of his medications for the past 2 weeks, and in turn has not taken any of his medications since then. Through the translator I expressed to the patient's the importance of medication compliance, and the risks of having uncontrolled hypertension and gout.   Review of Systems  [all other systems reviewed and are negative       Objective:   Physical Exam  Constitutional: He is oriented to person, place, and time. He appears well-developed and well-nourished.  HENT:  Head: Normocephalic and atraumatic.  Eyes: Pupils are equal, round, and reactive to light.  Neck: Normal range of motion. Neck supple. No JVD present. No thyromegaly present.  Cardiovascular: Normal rate, regular rhythm and normal heart sounds.   Pulmonary/Chest: Effort normal and breath sounds normal. No respiratory distress. He has no wheezes.  Abdominal: Soft. Bowel sounds are normal.  Musculoskeletal: Normal range of motion. He exhibits edema.  Neurological: He is alert and oriented to person, place, and time.  Skin: Skin is warm and dry. No erythema.          Assessment & Plan:

## 2010-08-23 NOTE — Assessment & Plan Note (Signed)
Patient has been out of this medication for the past 2 weeks, bowel refill his allopurinol check uric acid level today. She is not complaining of any joint pain, no signs of active count.

## 2010-08-23 NOTE — Assessment & Plan Note (Signed)
Patient has not been taking any of his medications, I have restarted his Coreg today, continued his Lasix, and restarted his lisinopril which was temporarily held due to acute on chronic renal failure in 2000 and was never restarted. I'll check bmet today and again in 2 weeks to assure that the patient's kidney function does not worsen with lisinopril.

## 2010-08-23 NOTE — Patient Instructions (Addendum)
T?ng Huy?t p (Cao Huy?t p) (Hypertension, High Blood Pressure) Khi tim ??p, n ??y mu l?u thng qua cc ??ng m?ch. L?c ??y ny ???c g?i l huy?t p. N?u huy?t p qu cao, ng??i ta g?i ? l ch?ng t?ng huy?t p (HTN) hay cao huy?t p. Ch?ng t?ng huy?t p r?t nguy hi?m v b?n c th? m?c ph?i m khng hay bi?t g. Huy?t p cao c ngh?a l tim c?a b?n ph?i lm vi?c nhi?u h?n ?? b?m mu. Cc ??ng m?ch c th? b? h?p ho?c x? c?ng. T?ng cng cho tim lm b?n c nguy c? m?c b?nh tim, ??t qu?, v cc b?nh l khc. Huy?t p bao g?m hai con s?, m?t ch? s? cao h?n trn m?t ch? s? th?p h?n, v d? nh? 110/72. Huy?t p ???c ghi l "110 trn 72". L t??ng l d??i 120 cho ch? s? trn (tm thu) v d??i 80 cho ch? s? d??i (tm tr??ng). Ghi huy?t p c?a b?n ngy hm nay.  B?n nn h?t s?c ch  ??n huy?t p c?a mnh n?u ?ang m?c ph?i nh?ng c?n b?nh no ? nh? l:  Suy tim   Ti?n s? b? ?au tim   Ti?u ???ng   B?nh th?n mn tnh   Ti?n s? ??t qu?   Nhi?u nguy c? m?c b?nh tim.  ?? xem c b? m?c ch?ng t?ng huy?t p hay khng, b?n nn ?o huy?t p khi ?ang ng?i v?i cnh tay ???c ??t ngang t?m tim c?a b?n. Nn ?o huy?t p t nh?t hai l?n. Ch? s? huy?t p cao m?t l?n (??c bi?t l ? [place] C?p C?u) khng c ngh?a l b?n c?n ph?i ???c ?i?u tr?. C th? c cc c?n b?nh m huy?t p khc nhau gi?a tay tri v tay ph?i. ?i?u quan tr?ng l ph?i s?m ?i khm ?? Bc s? ki?m tra l?i. ?a s? ng??i m?c ph?i ch?ng t?ng huy?t p nguyn pht, c ngh?a l khng c nguyn nhn c? th?. C th? lm gi?m d?ng cao huy?t p ny b?ng cch thay ??i y?u t? l?i s?ng nh? l:  C?ng th?ng   Ht thu?c   Thi?u t?p th? d?c   Th?a cn   S? d?ng ma ty/thu?c l/r??u   Ch? ?? ?n t mu?i  ?a s? ng??i khng c cc tri?u ch?ng do cao huy?t p cho ??n khi b?nh gy t?n h?i ??n c? th?. Vi?c ?i?u tr? hi?u qu? th??ng c th? ng?n ch?n, tr hon hay gi?m m?c ?? t?n h?i ?. ?I?U TR? Vi?c ?i?u tr? ch?ng cao huy?t p, khi ? xc ??nh ???c nguyn nhn, ???c nh?m vo  nguyn nhn gy b?nh ?. C r?t nhi?u thu?c ?i?u tr? ch?ng t?ng huy?t p. Cc thu?c ny c th? ???c chia ra thnh vi lo?i, v Bc s? s? gip b?n ch?n thu?c t?t nh?t cho mnh. Thu?c c th? c cc tc d?ng ph?. B?n v Bc s? nn xem l?i nh?ng tc d?ng ph? ?. N?u huy?t p v?n cao sau khi b?n ? thay ??i l?i s?ng ho?c b?t ??u dng thu?c th,  C th? c?n ph?i thay ??i (cc) thu?c.   C th? c?n ph?i ch tm ??n nh?ng v?n ?? khc.   Ph?i ch?c ch?n r?ng b?n hi?u toa thu?c, v bi?t r khi no dng thu?c v dng thu?c nh? th? no.   Ph?i ch?c ch?n r?ng b?n g?p Bc s? ?? ti khm trong khung th?i gian ???c khuy?n co (th??ng l trong vng hai  tu?n) ?? ki?m tra l?i huy?t p v xem l?i thu?c.   N?u dng nhi?u h?n m?t lo?i thu?c ?? h? huy?t p, ph?i ch?c ch?n r?ng b?n bi?t khi no nn dng thu?c v dng nh? th? no. Dng cng lc hai lo?i thu?c c th? d?n ??n huy?t p h? qu th?p.  NH?P VI?N NGAY L?P T?C N?U XU?T HI?N:  Nh?c ??u d? d?i, th? l?c thay ??i hay b? m?, ho?c l l?n.   Y?u ho?c t li?t b?t th??ng, ho?c c c?m gic chong ng?t.   ?au b?ng hay ng?c tr?m tr?ng, i m?a, ho?c kh th?.  HY CH?C CH?N R?NG B?N:  Hi?u nh?ng ch? d?n ny.   S? theo di tnh tr?ng c?a b?n.   S? nh?n ???c s? gip ?? ngay l?p t?c n?u b?n khng kh?e ho?c tr? nn t? h?n.  Document Released: 05/08/2005 Document Re-Released: 10/26/2009 [facility] Patient Information 2011 [place].

## 2010-08-23 NOTE — Assessment & Plan Note (Signed)
Baseline creatinine to 2-2.5, has not yet been seen by renal physician, they will check a metabolic panel and consider referral to renal physician at the next followup

## 2010-08-23 NOTE — Assessment & Plan Note (Signed)
Worsened, given lack of compliance with Lasix. Inform the patient of the importance of compliance I will reassess again in 2 weeks.

## 2010-08-29 LAB — POCT URINALYSIS DIP (DEVICE)
Bilirubin Urine: NEGATIVE
Glucose, UA: NEGATIVE mg/dL
Nitrite: NEGATIVE
Urobilinogen, UA: 0.2 mg/dL (ref 0.0–1.0)

## 2010-08-29 LAB — POCT I-STAT, CHEM 8
Calcium, Ion: 1.1 mmol/L — ABNORMAL LOW (ref 1.12–1.32)
Chloride: 100 mEq/L (ref 96–112)
Glucose, Bld: 119 mg/dL — ABNORMAL HIGH (ref 70–99)
HCT: 39 % (ref 39.0–52.0)
Hemoglobin: 13.3 g/dL (ref 13.0–17.0)
Potassium: 3.7 mEq/L (ref 3.5–5.1)

## 2010-08-30 ENCOUNTER — Ambulatory Visit: Payer: Self-pay | Admitting: Ophthalmology

## 2010-09-07 ENCOUNTER — Ambulatory Visit (INDEPENDENT_AMBULATORY_CARE_PROVIDER_SITE_OTHER): Payer: Self-pay | Admitting: Internal Medicine

## 2010-09-07 ENCOUNTER — Encounter: Payer: Self-pay | Admitting: Internal Medicine

## 2010-09-07 DIAGNOSIS — I10 Essential (primary) hypertension: Secondary | ICD-10-CM

## 2010-09-07 DIAGNOSIS — E876 Hypokalemia: Secondary | ICD-10-CM

## 2010-09-07 DIAGNOSIS — M79662 Pain in left lower leg: Secondary | ICD-10-CM

## 2010-09-07 DIAGNOSIS — M79609 Pain in unspecified limb: Secondary | ICD-10-CM

## 2010-09-07 DIAGNOSIS — E785 Hyperlipidemia, unspecified: Secondary | ICD-10-CM

## 2010-09-07 DIAGNOSIS — M109 Gout, unspecified: Secondary | ICD-10-CM

## 2010-09-07 DIAGNOSIS — M79605 Pain in left leg: Secondary | ICD-10-CM | POA: Insufficient documentation

## 2010-09-07 LAB — BASIC METABOLIC PANEL
BUN: 25 mg/dL — ABNORMAL HIGH (ref 6–23)
CO2: 26 mEq/L (ref 19–32)
Chloride: 107 mEq/L (ref 96–112)
Glucose, Bld: 177 mg/dL — ABNORMAL HIGH (ref 70–99)
Potassium: 3.8 mEq/L (ref 3.5–5.3)
Sodium: 138 mEq/L (ref 135–145)

## 2010-09-07 MED ORDER — TRAMADOL HCL 50 MG PO TABS: 50.0000 mg | ORAL_TABLET | Freq: Three times a day (TID) | ORAL | Status: DC | PRN

## 2010-09-07 MED ORDER — FUROSEMIDE 20 MG PO TABS: 20.0000 mg | ORAL_TABLET | Freq: Every day | ORAL | Status: DC

## 2010-09-07 NOTE — Progress Notes (Signed)
Pt , interpreter and sponsor aware of appt left leg venous US [facility] 09/08/10 1PM - interpreter was arranged to meet pt at 12:30PM at admitting office. Order faxed. Hilda Blades Mariaguadalupe Fialkowski RN 09/07/10 10:35AM

## 2010-09-07 NOTE — Assessment & Plan Note (Signed)
Patient's blood pressure is within normal limits today, this is much improved since last visit for his blood pressure was 163/98. Continue current regimen and check his metabolic panel

## 2010-09-07 NOTE — Assessment & Plan Note (Signed)
Today the patient complains of left lower extremity pain in the popliteal region, pain is not intra-articular, more located in the popliteal fossa of his left leg, radiates down his gastrocnemius muscles, which feels like a burning pain mild in intensity, no aggravating or relieving factor. The pain may be due to gout flare given the patient was restarted allopurinol, given the patient's chronic kidney disease he does not tolerate NSAIDs, and the patient cannot afford to buy colchicine. Given the atypical nature of the pain we'll order lower extremity ultrasound to rule out DVT, and give tramadol and Voltaren gel for pain relief.

## 2010-09-07 NOTE — Progress Notes (Signed)
  Subjective:    Patient ID: Luke Dunn, male    DOB: [birth year], 60 y.o.   MRN: [id number]  HPI  Patient is a 60 year old male of [place] origin, presents to outpatient clinic with an interpreter for two-week followup after the patient's blood pressure medications were restarted, prior to that the patient was not taking his medications as he was out of them, patient has been tolerating his medications well his vitals are stable. Today the patient complains of left lower extremity pain in the popliteal region, pain is not intra-articular, more located in the popliteal fossa of his left leg, radiates down his gastrocnemius muscles, which feels like a burning pain mild in intensity, no aggravating or relieving factor. His symptoms of shortness of breath or chest pain.  Review of Systems  All other systems reviewed and are negative.       Objective:   Physical Exam  Nursing note and vitals reviewed. Constitutional: He is oriented to person, place, and time. He appears well-developed and well-nourished.  HENT:  Head: Normocephalic and atraumatic.  Eyes: Pupils are equal, round, and reactive to light.  Neck: Normal range of motion. No JVD present. No thyromegaly present.  Cardiovascular: Normal rate, regular rhythm and normal heart sounds.   Pulmonary/Chest: Effort normal and breath sounds normal. He has no wheezes. He has no rales.  Abdominal: Soft. Bowel sounds are normal. There is no tenderness. There is no rebound.  Musculoskeletal: Normal range of motion. He exhibits no edema.  Neurological: He is alert and oriented to person, place, and time.  Skin: Skin is warm and dry.          Assessment & Plan:

## 2010-09-08 ENCOUNTER — Ambulatory Visit (HOSPITAL_COMMUNITY)
Admission: RE | Admit: 2010-09-08 | Discharge: 2010-09-08 | Disposition: A | Discharge status: Home / Self Care | Payer: Self-pay | Source: Ambulatory Visit | Attending: Internal Medicine | Admitting: Internal Medicine

## 2010-09-08 DIAGNOSIS — M79609 Pain in unspecified limb: Secondary | ICD-10-CM

## 2010-11-11 NOTE — Progress Notes (Signed)
Addended by: Vertell Limber on: 11/11/2010 06:43 AM   Modules accepted: Orders

## 2010-12-12 ENCOUNTER — Ambulatory Visit (INDEPENDENT_AMBULATORY_CARE_PROVIDER_SITE_OTHER): Payer: Self-pay | Admitting: Internal Medicine

## 2010-12-12 ENCOUNTER — Encounter: Payer: Self-pay | Admitting: Internal Medicine

## 2010-12-12 VITALS — BP 145/91 | HR 74 | Temp 98.4°F | Wt 192.4 lb

## 2010-12-12 DIAGNOSIS — Z0000 Encounter for general adult medical examination without abnormal findings: Secondary | ICD-10-CM

## 2010-12-12 DIAGNOSIS — N259 Disorder resulting from impaired renal tubular function, unspecified: Secondary | ICD-10-CM

## 2010-12-12 DIAGNOSIS — I10 Essential (primary) hypertension: Secondary | ICD-10-CM

## 2010-12-12 DIAGNOSIS — E876 Hypokalemia: Secondary | ICD-10-CM

## 2010-12-12 DIAGNOSIS — M79662 Pain in left lower leg: Secondary | ICD-10-CM

## 2010-12-12 LAB — BASIC METABOLIC PANEL
CO2: 26 mEq/L (ref 19–32)
Calcium: 8.8 mg/dL (ref 8.4–10.5)
Creat: 1.77 mg/dL — ABNORMAL HIGH (ref 0.50–1.35)
Glucose, Bld: 84 mg/dL (ref 70–99)

## 2010-12-12 MED ORDER — ALLOPURINOL 100 MG PO TABS: 50.0000 mg | ORAL_TABLET | Freq: Every day | ORAL | Status: DC

## 2010-12-12 MED ORDER — TRAMADOL HCL 50 MG PO TABS: 50.0000 mg | ORAL_TABLET | Freq: Three times a day (TID) | ORAL | Status: DC | PRN

## 2010-12-12 MED ORDER — FUROSEMIDE 20 MG PO TABS: 20.0000 mg | ORAL_TABLET | Freq: Every day | ORAL | Status: DC

## 2010-12-12 MED ORDER — POTASSIUM CHLORIDE 10 MEQ PO TBCR: 10.0000 meq | EXTENDED_RELEASE_TABLET | Freq: Every day | ORAL | Status: DC

## 2010-12-12 NOTE — Assessment & Plan Note (Signed)
The patient has a history of renal insufficiency (baseline Cr = 2-2.5), with an normal renal US (2cm R renal cyst) from 2010, but no apparent formal work-up. -will pursue some basic labs for initial work-up, including SPEP/UPEP, complement, and a UA -a referral was sent for nephrology.  However, we are unsure at this point if this referral would be covered under the patient's orange card insurance, and the patient does note financial constraints. -will follow-up at next visit

## 2010-12-12 NOTE — Patient Instructions (Signed)
For your blood pressure, continue to take ONLY your Furosemide (Lasix), 20 mg, 1 tablet per day -STOP taking your Carvedilol and Lisinopril medications.  We may re-start these at future visits if your blood pressure increases -if possible, please have your congregational nurse check your blood pressure weekly, and report any symptoms such as lightheadedness -please return for a follow-up visit in 3-4 weeks for a blood pressure check  For your kidneys, we are checking some labs today. -please follow-up with a Nephrologist (kidney doctor) for further work-up and management  Please use the cards provided to you to test for blood in your bowel movements. -on 3 separate occasions, smear some of your stool onto the appropriate area of the card, close the card, and send it to our office  Please return for a follow-up visit in 3-4 weeks, to check your blood pressure.

## 2010-12-12 NOTE — Assessment & Plan Note (Signed)
-  [facility] sent home with the patient, with detailed verbal instructions given.  The patient says he has used these cards in the past, and expresses understanding.

## 2010-12-12 NOTE — Progress Notes (Signed)
Follow-up Visit  HPI The patient is a 60 yo man with a history of HL, gout, HTN, and renal insufficiency, presenting for a follow-up visit.  The patient notes no complaints since his last visit.  He does note that he ran out of all of his medications about 1 month ago.  Prior to running out, he had been taking lasix and lisinopril for HTN (he was prescribed carvedilol, but had not been filling this medication).  Of note, he has had a problem of running out of his medications in the past, and has a BP of 163/98 off of all medications 08/23/10, and a BP of 102/68 on his 2 current meds 09/07/10.  The patient speaks only [place], and relies on a congregational nurse to help him fill his prescriptions.  The patient also has a history of renal insufficiency, with no evidence of a formal work-up, with baseline Cr = 2-2.5.  He has a renal US from 2010 that was normal, though with a 2cm right renal cyst.  He reports that he has never seen a nephrologist.  ROS: General: no fevers, chills, changes in weight, changes in appetite Skin: no rash HEENT: no blurry vision, hearing changes, sore throat Pulm: no dyspnea, coughing, wheezing CV: no chest pain, palpitations, shortness of breath Abd: no abdominal pain, nausea/vomiting, diarrhea/constipation Ext: no arthralgias, myalgias Neuro: no weakness, numbness, or tingling  Filed Vitals:   12/12/10 1504  BP: 145/91  Pulse: 74  Temp: 98.4 F (36.9 C)   PEX General: alert, cooperative, and in no apparent distress HEENT: pupils equal round and reactive to light, vision grossly intact, oropharynx clear and non-erythematous  Neck: supple, no lymphadenopathy, JVD Lungs: clear to ascultation bilaterally, normal work of respiration, no wheezes, rales, ronchi Heart: regular rate and rhythm, no murmurs, gallops, or rubs Abdomen: soft, non-tender, non-distended, normal bowel sounds Msk: no joint edema, warmth, or erythema Extremities: no cyanosis, clubbing, or  edema Neurologic: alert & oriented X3, cranial nerves II-XII intact, strength grossly intact, sensation intact to light touch  Assessment/Plan:

## 2010-12-12 NOTE — Assessment & Plan Note (Signed)
The patient has a history of hypertension, and has not been taking his antihypertensives for the last month.  Prior to that, he had been on lisinopril and lasix (he was prescribed carvedilol, but never filled the prescription). -we discussed the issue of medication refills, and his congregational nurse agreed to make a note in her calendar at the end of the 23-month period to call the patient and help him get his prescriptions renewed.  The patient also agreed to call his congregational nurse when he runs out of a medication to get the medication refilled. -since the patient's BP is near his goal today of 140/90 (BP = 145/91) without medication, and since he has a history of renal insufficiency, we will restart the patient on only lasix -his congregational nurse agrees to take his blood pressure weekly and report back the results -return for follow-up in 3-4 weeks to check BP and adjust meds as necessary (the next step would probably be to restart his carvedilol 12.5 mg BID)

## 2010-12-14 ENCOUNTER — Telehealth: Payer: Self-pay | Admitting: *Deleted

## 2010-12-14 LAB — UIFE/LIGHT CHAINS/TP QN, 24-HR UR
Albumin, U: DETECTED
Alpha 1, Urine: DETECTED — AB
Beta, Urine: DETECTED — AB
Free Lambda Lt Chains,Ur: 2.13 mg/dL — ABNORMAL HIGH (ref 0.02–0.67)
Gamma Globulin, Urine: DETECTED — AB

## 2010-12-14 LAB — PROTEIN ELECTROPHORESIS, SERUM
Alpha-1-Globulin: 4.4 % (ref 2.9–4.9)
Alpha-2-Globulin: 10.3 % (ref 7.1–11.8)
Beta 2: 5.4 % (ref 3.2–6.5)
Gamma Globulin: 21 % — ABNORMAL HIGH (ref 11.1–18.8)

## 2010-12-14 LAB — URINALYSIS, ROUTINE W REFLEX MICROSCOPIC

## 2010-12-14 NOTE — Telephone Encounter (Signed)
Call from [facility] a Scientist, research (physical sciences) calling for pt. [phone]) Pt saw Dr Owens Shark on the 24th.  When she filled pt's pill box she noted lasix bottle stated 1 bid but the AVS states take one tablet a day.  Talked with Dr Owens Shark and he clarified pt should be taking lasix 20 mg 1 daily. Pharmacy informed and Nurse informed.

## 2010-12-14 NOTE — Progress Notes (Signed)
I saw patient and discussed his care with resident Dr. Owens Shark.  I agree with the clinical findings and plans as outlined in his note.

## 2010-12-16 ENCOUNTER — Other Ambulatory Visit: Payer: Self-pay | Admitting: Internal Medicine

## 2010-12-16 DIAGNOSIS — N259 Disorder resulting from impaired renal tubular function, unspecified: Secondary | ICD-10-CM

## 2010-12-16 DIAGNOSIS — Z0000 Encounter for general adult medical examination without abnormal findings: Secondary | ICD-10-CM

## 2010-12-16 NOTE — Progress Notes (Signed)
The patient's Congregational Nurse, Hoyle Sauer, was called with the patient's recent lab results.  I explained to her the abnormalities in UPEP and SPEP, and how they could be a non-specific marker of infection.  I explained that we would like the patient to return for further lab work-up, including tests for hepatitis and HIV.  I also explained that we are working on getting him an appointment with Nephrology, but are having difficulties due to the patient's lack of insurance.  Hoyle Sauer expressed full understanding of these issues, and agreed to tell the patient this information, and call our clinic to schedule a time to come in for this lab work.  I explicitly expressed the importance of telling the patient that we are ordering a test for HIV, so that he would understand the rationale behind this test when he is asked to give consent for the test at his lab draw appointment.  To further clarify, Hoyle Sauer is this patient's point of care contact with the healthcare system, and helps drive him to appointments, fill and re-fill medications, arrange interpreter services, and keep track of his medical records.

## 2010-12-16 NOTE — Progress Notes (Signed)
Addended by: Hester Mates on: 12/16/2010 03:07 PM   Modules accepted: Orders

## 2010-12-19 ENCOUNTER — Other Ambulatory Visit: Payer: Self-pay

## 2010-12-20 LAB — IMMUNOFIXATION ELECTROPHORESIS
IgA: 256 mg/dL (ref 68–379)
IgG (Immunoglobin G), Serum: 1610 mg/dL — ABNORMAL HIGH (ref 650–1600)

## 2010-12-26 ENCOUNTER — Other Ambulatory Visit: Payer: Self-pay

## 2011-02-01 ENCOUNTER — Ambulatory Visit (INDEPENDENT_AMBULATORY_CARE_PROVIDER_SITE_OTHER): Payer: Self-pay | Admitting: Internal Medicine

## 2011-02-01 ENCOUNTER — Encounter: Payer: Self-pay | Admitting: Internal Medicine

## 2011-02-01 DIAGNOSIS — R609 Edema, unspecified: Secondary | ICD-10-CM

## 2011-02-01 DIAGNOSIS — N259 Disorder resulting from impaired renal tubular function, unspecified: Secondary | ICD-10-CM

## 2011-02-01 DIAGNOSIS — Z0000 Encounter for general adult medical examination without abnormal findings: Secondary | ICD-10-CM

## 2011-02-01 DIAGNOSIS — E876 Hypokalemia: Secondary | ICD-10-CM

## 2011-02-01 DIAGNOSIS — I10 Essential (primary) hypertension: Secondary | ICD-10-CM

## 2011-02-01 MED ORDER — LISINOPRIL 5 MG PO TABS: 5.0000 mg | ORAL_TABLET | Freq: Every day | ORAL | Status: DC

## 2011-02-01 MED ORDER — POTASSIUM CHLORIDE 10 MEQ PO TBCR: 10.0000 meq | EXTENDED_RELEASE_TABLET | Freq: Every day | ORAL | Status: DC

## 2011-02-01 NOTE — Assessment & Plan Note (Signed)
-  patient will receive flu shot from his congregational nurse for free

## 2011-02-01 NOTE — Assessment & Plan Note (Signed)
The patient's BP is well-controlled at today's visit, though the patient's congregational nurse notes that his BP has been consistently higher when she checks this at his home. -continue lasix -for both BP control and in the setting of CRI, we are starting Lisinopril 5

## 2011-02-01 NOTE — Assessment & Plan Note (Signed)
Patient has a history of chronic renal insufficiency, baseline cr = 2-2.5, though recently 1.8.  May be due to long-term uncontrolled HTN, may be nephrotic in origin. -obtaining random urine protein today -patient encouraged to follow-up with [facility], who has agreed to work with the patient's financial situation, and who has helped several [place] patients in similar situations in the past

## 2011-02-01 NOTE — Patient Instructions (Addendum)
For your high blood pressure, continue to take Lasix -we are adding another blood pressure medication that can help protect your kidneys as well, called Lisinopril.  Take 1 tablet, once per day  For your kidney function, we greatly suggest that you see a kidney specialist at [facility].  These physicians have worked with [place] patients in the past, and are willing to work with you financially. -also, for your current bill from [facility], if you call [facility] before they send a second bill, and they are often willing to negotiate a price with you  Please return for a follow-up visit in 6 months

## 2011-02-01 NOTE — Assessment & Plan Note (Signed)
Resolved with lasix.  Possibly due to nephrotic syndrome vs venous insufficiency.

## 2011-02-01 NOTE — Progress Notes (Signed)
HPI The patient is a 60 yo man, history of gout, HTN, CRI (Cr = 2-2.5 baseline), and LE edema, presenting for a routine follow-up.  The patient notes no complaints today.  At last visit, the patient had run out of his BP medications a few weeks prior, and his BP was elevated.  We figured out a way for his nurse to keep track of his medication refills, and simplified his medication regimen to include only Lasix.  Today, his BP is well controlled at 120/80.  The patient also noted chronic bilateral LE edema at the last visit, which has resolved with Lasix.  The patient has a history of CRI, with no formal work-up to determine the cause.  Tests ordered during the last encounter showed no specific etiology.  The patient was referred to a nephrologist, but has not scheduled an appointment out of financial concerns.  ROS: General: no fevers, chills, changes in weight, changes in appetite Skin: no rash HEENT: no blurry vision, hearing changes, sore throat Pulm: no dyspnea, coughing, wheezing CV: no chest pain, palpitations, shortness of breath Abd: no abdominal pain, nausea/vomiting, diarrhea/constipation GU: no dysuria, hematuria, polyuria Ext: no arthralgias, myalgias Neuro: no weakness, numbness, or tingling  Filed Vitals:   02/01/11 1546  BP: 140/85  Pulse: 89  Temp: 98.8 F (37.1 C)    PEX General: alert, cooperative, and in no apparent distress HEENT: pupils equal round and reactive to light, vision grossly intact, oropharynx clear and non-erythematous  Neck: supple, no lymphadenopathy, JVD, or carotid bruits Lungs: clear to ascultation bilaterally, normal work of respiration, no wheezes, rales, ronchi Heart: regular rate and rhythm, no murmurs, gallops, or rubs Abdomen: soft, non-tender, non-distended, normal bowel sounds Msk: no joint edema, warmth, or erythema Extremities: no cyanosis, clubbing, or edema Neurologic: alert & oriented X3, cranial nerves II-XII intact, strength  grossly intact, sensation intact to light touch  Assessment/Plan

## 2011-02-02 LAB — PROTEIN, URINE, RANDOM: Total Protein, Urine: 27 mg/dL

## 2011-02-02 NOTE — Progress Notes (Signed)
Addended by: Hester Mates on: 02/02/2011 02:24 PM   Modules accepted: Orders

## 2011-02-02 NOTE — Progress Notes (Signed)
agree

## 2011-02-03 LAB — CREATININE, URINE, RANDOM: Creatinine, Urine: 65.2 mg/dL

## 2011-02-16 ENCOUNTER — Other Ambulatory Visit: Payer: Self-pay | Admitting: Internal Medicine

## 2011-04-05 NOTE — Progress Notes (Signed)
Addended by: Hulan Fray on: 04/05/2011 03:12 PM   Modules accepted: Orders

## 2011-12-20 ENCOUNTER — Encounter: Payer: Self-pay | Admitting: Internal Medicine

## 2011-12-20 ENCOUNTER — Ambulatory Visit (INDEPENDENT_AMBULATORY_CARE_PROVIDER_SITE_OTHER): Payer: Self-pay | Admitting: Internal Medicine

## 2011-12-20 VITALS — BP 134/84 | HR 72 | Temp 97.6°F | Ht 63.0 in | Wt 203.9 lb

## 2011-12-20 DIAGNOSIS — N189 Chronic kidney disease, unspecified: Secondary | ICD-10-CM

## 2011-12-20 DIAGNOSIS — M109 Gout, unspecified: Secondary | ICD-10-CM

## 2011-12-20 DIAGNOSIS — I129 Hypertensive chronic kidney disease with stage 1 through stage 4 chronic kidney disease, or unspecified chronic kidney disease: Secondary | ICD-10-CM

## 2011-12-20 DIAGNOSIS — I10 Essential (primary) hypertension: Secondary | ICD-10-CM

## 2011-12-20 MED ORDER — FUROSEMIDE 20 MG PO TABS: 20.0000 mg | ORAL_TABLET | Freq: Every day | ORAL | Status: DC

## 2011-12-20 MED ORDER — POTASSIUM CHLORIDE ER 10 MEQ PO TBCR: 10.0000 meq | EXTENDED_RELEASE_TABLET | Freq: Every day | ORAL | Status: DC

## 2011-12-20 MED ORDER — LISINOPRIL 5 MG PO TABS: 5.0000 mg | ORAL_TABLET | Freq: Every day | ORAL | Status: DC

## 2011-12-20 NOTE — Progress Notes (Signed)
HPI The patient is a 61 y.o. yo male with a history of gout, HTN, and CRI, presenting for follow-up.  The patient notes no acute complaints today.  Complete review of systems was negative.  The patient notes compliance with his blood pressure medications.  He has stopped taking Allopurinol due to cost, but has had no further flares of gout.  BP today is well-controlled.  ROS: General: no fevers, chills, changes in weight, changes in appetite Skin: no rash HEENT: no blurry vision, hearing changes, sore throat Pulm: no dyspnea, coughing, wheezing CV: no chest pain, palpitations, shortness of breath Abd: no abdominal pain, nausea/vomiting, diarrhea/constipation GU: no dysuria, hematuria, polyuria Ext: no arthralgias, myalgias Neuro: no weakness, numbness, or tingling  Filed Vitals:   12/20/11 1707  BP: 134/84  Pulse:   Temp:     PEX General: alert, cooperative, and in no apparent distress HEENT: pupils equal round and reactive to light, vision grossly intact, oropharynx clear and non-erythematous  Neck: supple, no lymphadenopathy, thyroid exam unremarkable Lungs: clear to ascultation bilaterally, normal work of respiration, no wheezes, rales, ronchi Heart: regular rate and rhythm, no murmurs, gallops, or rubs Abdomen: soft, non-tender, non-distended, normal bowel sounds Msk: no joint edema, warmth, or erythema Extremities: no cyanosis, clubbing, or edema Neurologic: alert & oriented X3, cranial nerves II-XII intact, strength grossly intact, sensation intact to light touch  Assessment/Plan

## 2011-12-20 NOTE — Patient Instructions (Signed)
Please continue your current medications.  We are checking your kidney function today.  I will call you to let you know the results of those labs.  Please return for a follow-up visit in 6 months.

## 2011-12-20 NOTE — Assessment & Plan Note (Signed)
BP well-controlled on re-check today -continue lisinopril, lasix, and potassium supplementation

## 2011-12-20 NOTE — Assessment & Plan Note (Addendum)
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  Laboratory work-up last year revealed no specific etiology, though did show a protein/creatinine ratio of 0.41 (410 mg of protein/day).  Further laboratory testing, including an HIV panel, full complement levels, etc. was ordered last year, but the patient declined these out of financial concerns. -check BMET today to monitor creatinine  Addendum 12/22/11 - labs showed high K and elevated cr. Differential includes aki (vol depletion) vs true worsening of renal disease. Called pt, instructed to stop K supplementation, drink more water, and return for repeat labs in 1-2 weeks

## 2011-12-20 NOTE — Assessment & Plan Note (Signed)
The patient has stopped allopurinol due to cost, but hasn't had a gout flare since that time. -discontinue allopurinol, consider restarting in the future if gout flares become a problem

## 2011-12-21 LAB — BASIC METABOLIC PANEL
Potassium: 6 mEq/L — ABNORMAL HIGH (ref 3.5–5.3)
Sodium: 142 mEq/L (ref 135–145)

## 2011-12-22 ENCOUNTER — Telehealth: Payer: Self-pay | Admitting: Internal Medicine

## 2011-12-22 NOTE — Telephone Encounter (Signed)
Called patient regarding lab results of worsening creatinine and elevated potassium.  May represent true worsening of renal failure vs acute dehydration. Instructed carolyn to tell pt to stop potassium supplements, drink extra fluids, and return in 1-2 weeks for repeat lab work. Hoyle Sauer expressed understanding.

## 2011-12-22 NOTE — Addendum Note (Signed)
Addended by: Hester Mates on: 12/22/2011 04:11 PM   Modules accepted: Orders

## 2012-01-18 ENCOUNTER — Other Ambulatory Visit (INDEPENDENT_AMBULATORY_CARE_PROVIDER_SITE_OTHER): Payer: Self-pay

## 2012-01-18 DIAGNOSIS — N189 Chronic kidney disease, unspecified: Secondary | ICD-10-CM

## 2012-01-19 LAB — COMPLETE METABOLIC PANEL WITH GFR
ALT: <8 U/L (ref 0–53)
AST: 23 U/L (ref 0–37)
Albumin: 4.3 g/dL (ref 3.5–5.2)
Alkaline Phosphatase: 83 U/L (ref 39–117)
Calcium: 9.1 mg/dL (ref 8.4–10.5)
Chloride: 105 mEq/L (ref 96–112)
Potassium: 4.8 mEq/L (ref 3.5–5.3)
Sodium: 139 mEq/L (ref 135–145)
Total Protein: 7.6 g/dL (ref 6.0–8.3)

## 2012-01-19 LAB — PROTEIN / CREATININE RATIO, URINE: Creatinine, Urine: 352.5 mg/dL

## 2012-02-13 ENCOUNTER — Telehealth: Payer: Self-pay | Admitting: *Deleted

## 2012-02-13 NOTE — Telephone Encounter (Signed)
Hoyle Sauer, cong. Nurse, calls and is concerned about pt's latest lab results, could you please call her to discuss a plan of care and meds. Ph# [phone]

## 2012-02-20 NOTE — Telephone Encounter (Signed)
Patient's congregational nurse called with results.  I explained that although his potassium had improved after discontinuing his potassium supplementation, his creatinine continued to be elevated.  We discussed the likely need for dialysis in the future, and the importance of a nephrology referral sooner rather than later.  The patient had been referred to nephrology 1 year ago, but had not followed through with the appointment out of concern for financial issues.  However, given his worsening renal function and likely future need for dialysis, such a referral is unavoidable at this point.  Hoyle Sauer expressed understanding.

## 2012-02-21 ENCOUNTER — Other Ambulatory Visit: Payer: Self-pay | Admitting: Internal Medicine

## 2012-02-23 ENCOUNTER — Encounter: Payer: Self-pay | Admitting: Internal Medicine

## 2012-03-12 ENCOUNTER — Encounter: Payer: Self-pay | Admitting: Internal Medicine

## 2012-03-12 ENCOUNTER — Ambulatory Visit (INDEPENDENT_AMBULATORY_CARE_PROVIDER_SITE_OTHER): Payer: Self-pay | Admitting: Internal Medicine

## 2012-03-12 DIAGNOSIS — N189 Chronic kidney disease, unspecified: Secondary | ICD-10-CM

## 2012-03-12 NOTE — Progress Notes (Signed)
The patient presents for a no-charge visit, solely to discuss results of recent lab work concerning his CKD.  The patient is a  61 yo Luke Dunn patient, who has been seeing [place] in our clinic since 2010, mainly for chronic hypertension, chronic kidney disease, and gout.  The patient's BP has been variably controlled over this time period, with barriers to BP control including periods of non-compliance with his medications due to financial concerns, as well as a language barrier (physician visits are conducted with the use of an interpreter).  When the patient presented in 2010, his baseline creatinine appeared to be around 1.8-2.2, thought to be secondary to long-standing poorly-controlled hypertension.  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  The patient's congregational nurse, who accompanies him to his office visits and helps coordinate his care, shared with me that they had to take up a separate church collection to help the patient pay the bill for his SPEP/UPEP.  Recently, the patient's CKD appears to have worsened, and his creatinine has climbed from 1.77 (12/12/10) to 2.7 (12/20/11), to 3.09 (01/18/12).  Since financial issues are so important to the patient, and he looks  like he may be heading towards dialysis in the future, I'd like to refer the patient to nephrology, for guidance in ordering the most important and cost-efficient tests to further work-up and treat his CKD.  After a long discussion with the patient outlining the course of chronic kidney disease, and describing the dialysis process, the patient expressed understanding of the situation, and agreed to establish care with a Nephrologist.  An appointment will be made with [facility], preferably on a MWF at the end of the day to accommodate the patient's work schedule.

## 2012-03-12 NOTE — Patient Instructions (Addendum)
We are scheduling you an appointment with [facility], to further evaluate your Chronic Kidney Disease.  We will call you with this appointment.  Please return for a routine follow-up visit in 6 months.

## 2012-03-13 NOTE — Addendum Note (Signed)
Addended by: Hester Mates on: 03/13/2012 11:20 AM   Modules accepted: Orders

## 2012-04-12 ENCOUNTER — Telehealth: Payer: Self-pay | Admitting: *Deleted

## 2012-04-12 MED ORDER — ALLOPURINOL 100 MG PO TABS: 100.0000 mg | ORAL_TABLET | Freq: Every day | ORAL | Status: DC

## 2012-04-12 MED ORDER — TRAMADOL HCL 50 MG PO TABS: 50.0000 mg | ORAL_TABLET | Freq: Four times a day (QID) | ORAL | Status: DC | PRN

## 2012-04-12 MED ORDER — PREDNISONE 10 MG PO TABS: ORAL_TABLET | ORAL | Status: DC

## 2012-04-12 NOTE — Telephone Encounter (Signed)
Received a call from Chrys Racer, congregational nurse - # [phone] referred to Dr Justin Mend at [facility]  Pt was seen today, he is  C/o Pain in rt hand. Nurse states it is puffy around wrist and with radiation of pain  to elbow. He also c/o knee and ankle pain on the right side.  Onset 1 week ago.   Pt also sees Dr Justin Mend for renal and nurse states  kidney function is at 25%.  Hx gout  She is asking for refill on allopurinol and ultram.  These are not on the current med list. Would you want pt to be seen first?  He has a hard time coming into clinic.

## 2012-04-12 NOTE — Telephone Encounter (Signed)
Returned call to [place].  She confirmed that patient has had right ankle, knee, and wrist pain and swelling for the last 1 week.  No history of trauma, no fevers, chills, nausea, or vomiting.  The patient has a history of gout, with several prior flares, and self-stopped taking allopurinol a few months ago.  The patient has significant financial limitations.  Based on symptom description and history of gout, I will treat empirically for gout.  I informed Hoyle Sauer to inform the patient to seek care in our clinic or an ED if symptoms worsened or did not improve.  Unable to prescribe NSAIDs (CKD), and I don't think patient would be able to afford colchicine. -prescribed prednisone - days 1-4: 30 mg/day, days 5-7: 20 mg/day, days 8-10: 10 mg/day -prescribed ultram for pain -re-prescribed allopurinol, with instructions not to start until after full resolution of current flare -again, patient is to seek care in our clinic or another care facility if symptoms worsen or do not improve.

## 2012-04-23 ENCOUNTER — Encounter: Payer: Self-pay | Admitting: *Deleted

## 2012-05-07 ENCOUNTER — Ambulatory Visit: Payer: No Typology Code available for payment source

## 2012-07-24 ENCOUNTER — Encounter: Payer: Self-pay | Admitting: Internal Medicine

## 2012-07-24 NOTE — Progress Notes (Signed)
Amlodipine added by patient's Nephrologist, per office note.  Patient's medication section updated.

## 2012-08-21 ENCOUNTER — Inpatient Hospital Stay (HOSPITAL_COMMUNITY)
Admission: AD | Admit: 2012-08-21 | Discharge: 2012-08-22 | DRG: 554 | Disposition: A | Discharge status: Home / Self Care | Payer: No Typology Code available for payment source | Source: Ambulatory Visit | Attending: Internal Medicine | Admitting: Internal Medicine

## 2012-08-21 ENCOUNTER — Ambulatory Visit (INDEPENDENT_AMBULATORY_CARE_PROVIDER_SITE_OTHER): Payer: No Typology Code available for payment source | Admitting: Internal Medicine

## 2012-08-21 ENCOUNTER — Encounter: Payer: Self-pay | Admitting: Internal Medicine

## 2012-08-21 ENCOUNTER — Encounter (HOSPITAL_COMMUNITY): Payer: Self-pay | Admitting: Internal Medicine

## 2012-08-21 ENCOUNTER — Inpatient Hospital Stay (HOSPITAL_COMMUNITY): Payer: No Typology Code available for payment source

## 2012-08-21 VITALS — BP 177/99 | HR 75 | Temp 98.1°F | Ht 63.0 in | Wt 196.3 lb

## 2012-08-21 DIAGNOSIS — R609 Edema, unspecified: Secondary | ICD-10-CM

## 2012-08-21 DIAGNOSIS — M1A9XX0 Chronic gout, unspecified, without tophus (tophi): Secondary | ICD-10-CM | POA: Diagnosis present

## 2012-08-21 DIAGNOSIS — Z79899 Other long term (current) drug therapy: Secondary | ICD-10-CM

## 2012-08-21 DIAGNOSIS — M1030 Gout due to renal impairment, unspecified site: Secondary | ICD-10-CM | POA: Diagnosis present

## 2012-08-21 DIAGNOSIS — I10 Essential (primary) hypertension: Secondary | ICD-10-CM | POA: Diagnosis present

## 2012-08-21 DIAGNOSIS — M109 Gout, unspecified: Principal | ICD-10-CM

## 2012-08-21 DIAGNOSIS — D649 Anemia, unspecified: Secondary | ICD-10-CM | POA: Diagnosis present

## 2012-08-21 DIAGNOSIS — N183 Chronic kidney disease, stage 3 unspecified: Secondary | ICD-10-CM | POA: Diagnosis present

## 2012-08-21 DIAGNOSIS — M25531 Pain in right wrist: Secondary | ICD-10-CM | POA: Diagnosis present

## 2012-08-21 DIAGNOSIS — N186 End stage renal disease: Secondary | ICD-10-CM | POA: Diagnosis present

## 2012-08-21 DIAGNOSIS — I129 Hypertensive chronic kidney disease with stage 1 through stage 4 chronic kidney disease, or unspecified chronic kidney disease: Secondary | ICD-10-CM | POA: Diagnosis present

## 2012-08-21 DIAGNOSIS — M1A00X0 Idiopathic chronic gout, unspecified site, without tophus (tophi): Secondary | ICD-10-CM | POA: Diagnosis present

## 2012-08-21 DIAGNOSIS — M25539 Pain in unspecified wrist: Secondary | ICD-10-CM

## 2012-08-21 LAB — CBC WITH DIFFERENTIAL/PLATELET
HCT: 31.2 % — ABNORMAL LOW (ref 39.0–52.0)
Hemoglobin: 10.1 g/dL — ABNORMAL LOW (ref 13.0–17.0)
Lymphocytes Relative: 22 % (ref 12–46)
Lymphs Abs: 1.4 10*3/uL (ref 0.7–4.0)
MCHC: 32.4 g/dL (ref 30.0–36.0)
Monocytes Absolute: 0.5 10*3/uL (ref 0.1–1.0)
Monocytes Relative: 8 % (ref 3–12)
Neutro Abs: 4.2 10*3/uL (ref 1.7–7.7)
RBC: 3.95 MIL/uL — ABNORMAL LOW (ref 4.22–5.81)
WBC: 6.5 10*3/uL (ref 4.0–10.5)

## 2012-08-21 LAB — PROTIME-INR: INR: 1.09 (ref 0.00–1.49)

## 2012-08-21 LAB — COMPREHENSIVE METABOLIC PANEL
ALT: 15 U/L (ref 0–53)
Calcium: 9 mg/dL (ref 8.4–10.5)
Creatinine, Ser: 2.44 mg/dL — ABNORMAL HIGH (ref 0.50–1.35)
GFR calc Af Amer: 31 mL/min — ABNORMAL LOW (ref >90)
Glucose, Bld: 101 mg/dL — ABNORMAL HIGH (ref 70–99)
Sodium: 138 mEq/L (ref 135–145)
Total Protein: 8 g/dL (ref 6.0–8.3)

## 2012-08-21 LAB — APTT: aPTT: 42 seconds — ABNORMAL HIGH (ref 24–37)

## 2012-08-21 MED ORDER — FUROSEMIDE 20 MG PO TABS
20.0000 mg | ORAL_TABLET | Freq: Every day | ORAL | Status: DC
  Administered 2012-08-21 – 2012-08-22 (×2): 20 mg via ORAL
  Filled 2012-08-21 (×2): qty 1

## 2012-08-21 MED ORDER — ONDANSETRON HCL 4 MG/2ML IJ SOLN: 4.0000 mg | Freq: Four times a day (QID) | INTRAMUSCULAR | Status: DC | PRN

## 2012-08-21 MED ORDER — ONDANSETRON HCL 4 MG PO TABS: 4.0000 mg | ORAL_TABLET | Freq: Four times a day (QID) | ORAL | Status: DC | PRN

## 2012-08-21 MED ORDER — SODIUM CHLORIDE 0.9 % IJ SOLN: 3.0000 mL | INTRAMUSCULAR | Status: DC | PRN

## 2012-08-21 MED ORDER — PREDNISONE 20 MG PO TABS
40.0000 mg | ORAL_TABLET | Freq: Once | ORAL | Status: AC
  Administered 2012-08-21: 40 mg via ORAL
  Filled 2012-08-21: qty 2

## 2012-08-21 MED ORDER — SODIUM CHLORIDE 0.9 % IJ SOLN
3.0000 mL | Freq: Two times a day (BID) | INTRAMUSCULAR | Status: DC
  Administered 2012-08-21: 3 mL via INTRAVENOUS

## 2012-08-21 MED ORDER — HYDROCODONE-ACETAMINOPHEN 5-325 MG PO TABS
1.0000 | ORAL_TABLET | ORAL | Status: DC | PRN
  Administered 2012-08-21 – 2012-08-22 (×2): 2 via ORAL
  Filled 2012-08-21 (×2): qty 2

## 2012-08-21 MED ORDER — SODIUM CHLORIDE 0.9 % IV SOLN: 250.0000 mL | INTRAVENOUS | Status: DC | PRN

## 2012-08-21 MED ORDER — LISINOPRIL 5 MG PO TABS
5.0000 mg | ORAL_TABLET | Freq: Every day | ORAL | Status: DC
  Administered 2012-08-21 – 2012-08-22 (×2): 5 mg via ORAL
  Filled 2012-08-21 (×2): qty 1

## 2012-08-21 MED ORDER — ALLOPURINOL 100 MG PO TABS
100.0000 mg | ORAL_TABLET | Freq: Every day | ORAL | Status: DC
  Administered 2012-08-21 – 2012-08-22 (×2): 100 mg via ORAL
  Filled 2012-08-21 (×2): qty 1

## 2012-08-21 MED ORDER — HEPARIN SODIUM (PORCINE) 5000 UNIT/ML IJ SOLN
5000.0000 [IU] | Freq: Three times a day (TID) | INTRAMUSCULAR | Status: DC
  Administered 2012-08-21 – 2012-08-22 (×2): 5000 [IU] via SUBCUTANEOUS
  Filled 2012-08-21 (×5): qty 1

## 2012-08-21 NOTE — Assessment & Plan Note (Addendum)
The patient presents with a 3-day history of worsening right wrist pain, with edema, streaking erythema, as well as numbness and weakness of his right hand.  Our greatest concern for this time is septic joint, given above symptoms as well as night sweats (though no fever).  Additionally, his neurologic symptoms raise concern for compartment syndrome.  Other differential diagnoses include gout (history of the same) vs fracture (though no reported trauma).  We performed a joint aspiration in clinic today, which yielded <1 cc of straw-colored fluid. -plan to admit to inpatient out of concern for septic joint -check CBC, ESR, BMET -plan to consult hand surgery  The procedure was explained to the patient, including the risks of infection, bleeding, or damage to the surrounding structures.  The patient expressed understanding, and signed a consent form.  The patient's dorsal wrist was palpated, and the planned injection site was marked with a pen, just medial to the extensor pollicus longus tendon.  The site was cleaned with alcohol and betadine x2.  Using sterile gloves, the area was numbed with 1% lidocaine without epinephrine.  A 22 gauge needle was inserted into the site at a 90 degree angle, meeting no resistance.  The syringe was aspirated as it was advanced, and yielded <1cc of straw-colored fluid.  The syringe was sent to the lab.  We were informed there was not enough fluid for cell count, so the fluid was sent for culture.  The site was bandaged with a band-aid.

## 2012-08-21 NOTE — Progress Notes (Signed)
HPI The patient is a 62 y.o. male with a history of gout, HTN, CKD, presenting for an acute visit.  Three days ago, the patient was at work, lifting heavy objects (potted plants).  At the end of the day, the patient noticed diffuse right wrist pain.  He notes no acute onset of pain, no trauma, and no wrist "popping" sensation.  He describes the pain as a dull, aching pain, 10/10 in severity, radiating to his right radial forearm.  He also notes numbness of his fingers, both palmar and dorsal surfaces, to the level of his wrist, which has been present since that time.  He notes weakness of that hand, unable to make a fist (due to both pain and weakness), as well as erythema of his wrist.  He notes no subjective fevers, but does note new night sweats for the last 3 days.  He has been unable to return to work due to the pain.  Of note, the patient's BP is elevated today.  The patient's congregational nurse notes that she has been checking the patient's BP at home, and reports her 2 latest values at 126/80 and 98/60.   ROS: General: no fevers, changes in weight, changes in appetite Skin: no rash HEENT: no blurry vision, hearing changes, sore throat Pulm: no dyspnea, coughing, wheezing CV: no chest pain, palpitations, shortness of breath Abd: no abdominal pain, nausea/vomiting, diarrhea/constipation GU: no dysuria, hematuria, polyuria Ext: see HPI Neuro: see HPI  Filed Vitals:   08/21/12 1743  BP: 177/99  Pulse: 75  Temp: 98.1 F (36.7 C)   PEX General: alert, cooperative, appears nervous HEENT: pupils equal round and reactive to light, vision grossly intact, oropharynx clear and non-erythematous  Neck: supple, no lymphadenopathy Lungs: clear to ascultation bilaterally, normal work of respiration, no wheezes, rales, ronchi Heart: regular rate and rhythm, no murmurs, gallops, or rubs Abdomen: soft, non-tender, non-distended, normal bowel sounds Extremities: Right wrist diffusely edematous,  with large patches of erythema noted on radial wrist and dorsal wrist.  Active and passive ROM limited by pain.  No pain elicited on palpation of elbow or MCP, PIP, or DIP. Neurologic: alert & oriented X3, cranial nerves II-XII intact, right wrist strength 4/5, otherwise strength 5/5.  Sensation to pinprick diminished in right hand from the wrist to the fingertips. Otherwise, strength and sensation preserved throughout.  Current Outpatient Prescriptions on File Prior to Visit  Medication Sig Dispense Refill  . allopurinol (ZYLOPRIM) 100 MG tablet Take 1 tablet (100 mg total) by mouth daily.  30 tablet  11  . amLODipine (NORVASC) 10 MG tablet Take 10 mg by mouth daily.      . furosemide (LASIX) 20 MG tablet Take 1 tablet (20 mg total) by mouth daily.  90 tablet  3  . lisinopril (PRINIVIL,ZESTRIL) 5 MG tablet Take 1 tablet (5 mg total) by mouth daily.  90 tablet  3  . predniSONE (DELTASONE) 10 MG tablet Take 3 tablets per day for days 1-4, then 2 tabs/day for days 5-7, then 1 tab/day for days 8-10  21 tablet  0  . traMADol (ULTRAM) 50 MG tablet Take 1-2 tablets (50-100 mg total) by mouth every 6 (six) hours as needed for pain.  60 tablet  0   No current facility-administered medications on file prior to visit.    Assessment/Plan

## 2012-08-21 NOTE — Progress Notes (Signed)
Report  Called to [facility] on 5N - pt tranported ambulatory (per his request as communicated by gesturing a few English words) to room 21, in stable condition. Nursing staff advised of pt's language barrier - pt oriented to room accomodations and NT at his side on arrival. Leroy Sea, 08/21/12, 6:20P

## 2012-08-21 NOTE — Patient Instructions (Signed)
Patient admitted

## 2012-08-21 NOTE — H&P (Signed)
[facility] Admission Note Date: 08/21/2012  Patient name: Luke Dunn Medical record number: [id number] Date of birth: March 11, 1951 Age: 62 y.o. Gender: male PCP: Elnora Morrison, MD  Medical Service:  Internal Medicine Residents Service  Attending physician:   Tarri Abernethy, DO     Chief Complaint:   Right wrist pain  History of Present Illness: The patient is a 62 y.o. male with a history of gout, HTN, CKD, presenting with painful swollen right wrist for 3 days.  3 days ago, patient was in normal health and was lifting heavy potted plants at work.  At the end of the day, he noticed diffuse right wrist pain.  This wrist pain steadily worsened over the course of the next three days and he describes it as an intense dull aching 10/10 pain.  It also radiates down into his mid forearm.  He notes that it is also warm, swollen, and erythematous.  He denies any recent trauma or other potential inciting event other than the heavy lifting he did previously.  He endorses numbness in his hand on both palmar and volar surfaces from his fingers to his wrist.  He endorses decreased range of motion secondary to swelling and pain.  He has not tried anything to alleviate the pain.  He states that he has had gout flares in the past in other joints, most recently in November in his left wrist and both knees and that this pain is different in quality from the gout flares.   He was evaluated in the Outpatient Clinic prior to admission and the joint was aspirated returning <19mL of straw colored fluid.  He denies fevers.  He does endorse recent night sweats since his wrist pain began.  He denies any nausea, vomiting, as well as pain in any other part of his body.  Meds: No current outpatient prescriptions on file. Allopurinol 100mg  po daily Lasix 20mg  po daily Lisinopril 5mg  po daily  Allergies: Allergies as of 08/21/2012  . (No Known Allergies)   Past Medical History  Diagnosis Date  . Gout   . Dental cavities     . Renal insufficiency     Baseline Creatinine 2.0  . Essential hypertension    No past surgical history on file. No family history on file. History   Social History  . Marital Status: Single    Spouse Name: N/A    Number of Children: N/A  . Years of Education: N/A   Occupational History  . Not on file.   Social History Main Topics  . Smoking status: Former Smoker -- 1.00 packs/day for 37 years    Quit date: 02/01/2007  . Smokeless tobacco: Former Systems developer    Quit date: 02/01/2007  . Alcohol Use: No  . Drug Use: No  . Sexually Active: Not on file   Other Topics Concern  . Not on file   Social History Narrative   [name] speaking only, low educational level, works in Architect.  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       Financial assistance approved for 70% discount at [facility] and has [facility] card.     Review of Systems: Pertinent items are noted in HPI.  Physical Exam: Blood pressure 191/108, pulse 76, temperature 98 F (36.7 C), resp. rate 20, SpO2 100.00%. Physical Exam  Constitutional: alert, cooperative, appears slightly nervous Head: Normocephalic and atraumatic.  Eyes: Conjunctivae and EOM are normal, anicteric sclera Cardiovascular: Normal rate, regular rhythm, normal heart sounds and intact distal pulses.Marland Kitchen  Respiratory:  Effort normal and breath sounds normal.  GI: Soft. He exhibits no distension. There is no tenderness.  Musculoskeletal:  Right wrist: He exhibits decreased range of motion, tenderness, swelling and effusion.  Right wrist diffusely edematous with large patches of erythema noted per illustration below.  No pain elicited on palpation of DIP, PIP, MCP joints. Arms: Neurological: He is alert and oriented to person, place, and time. Right wrist strength decreased compared to left.  Decreased pinprick sensation in right hand on palmar and dorsal surfaces from fingers to wrist.  Remainder of sensorimotor exam is normal. Skin: Skin is warm and dry.Streaking  erythema noted as per illustration  Psychiatric: He has a normal mood and affect. His behavior is normal.   Lab results:  Imaging results:  No results found.   Assessment & Plan by Problem:  Mr. Macinnes is a 62yo man presenting with 3 day history of steadily worsening right wrist pain.   1. Right wrist pain  Etiology of his new onset wrist pain includes septic joint, gout flare, fracture, compartment syndrome, and reactive arthritis.  Findings are concerning for septic joint given his streaking erythema, edema, progressive nature of his pain, decreased ROM and loss of sensation, and new onset night sweats.  Gout flare seems less likely given that the presentation differs from his previous flares. Fracture is possible, as he endorses maximal tenderness over his right medial wrist, but he denied any initial acute onset of pain.  Compartment syndrome is a concerning given his new neurologic symptoms and pain, though his pulses remain intact currently.  Plan. - joint aspiration was performed when patient was in Outpatient clinic, <1cc of straw colored fluid was returned which was sent for culture, f/u results of joint culture  - Blood culture x2 ordered, F/U blood cultures  - check CBC, ESR, BMET  - Wrist X-ray ordered to evaluate for fracture  - Hand surgeon consult placed, [facility] to evaluate patient   2. Gout Patient has history of gout flares in past.  Will plan to maintain patient on allopurinol regimen during this hospitalization.  Plan -Allopurinol 100mg  po daily  3. Hypertension Patient BP currently 191/108.  Will maintain patient on home medication - Lasix 20mg  po daily - Lisinopril 5mg  po daily  4. Chronic Kidney Disease Patient is followed by [facility], will assess patients renal function with CMP and consult nephrology as needed.   This is a Careers information officer Note.  The care of the patient was discussed with Dr. Owens Shark and the assessment and plan was  formulated with their assistance.  Please see their note for official documentation of the patient encounter.   SignedLyda Perone 08/21/2012, 6:47 PM

## 2012-08-21 NOTE — Consult Note (Signed)
CASE DISCUSSED WITH DR. Owens Shark VIA CELL PHONE THIS EVENING I SAW AND EXAMINED THE PATIENT AND THERE IS A BARRIER TO COMMUNICATION HOWEVER I WAS ASKED TO SEE PATIENT AND EVALUATE HIS RIGHT WRIST FOR SEPTIC ARTHRITIS AND CONCERN FOR COMPARTMENT SYNDROME PT WITH NO INCITING FACTORS FOR EITHER TWO DIAGNOSES. PT WITH KNOWN HISTORY OF OF GOUT ASPIRATED FLUID TODAY WAS STRAW COLORED ON MY EXAM OF THE WRIST THE PATIENT DID NOT HAVE PAIN WITH PASSIVE RANGE OF MOTION OF THE WRIST JOINT AND WAS TENDER OVER THE COURSE OF THE MEDIAN NERVE AND FIRST WEB SPACE HE DOES HAVE SOME SWELLING TO HIS HAND, NO SIGNS OF COMPARTMENT SYNDROME MY RECOMMENDATION WOULD BE TO TREAT THE PATIENT AS AN OUTPATIENT FOR GOUT, ORAL DOSE STEROIDS, IMMOBILIZE THE WRIST IN AN ORTHOSIS, ELEVATE THE HAND, ICE THE HAND FOR SWELLING, MONITOR HIS PROGRESS AS AN OUTPATIENT XRAYS REVIEWED SHOW GOOD JOINT INTERVAL PRESERVATION NO EVIDENCE OF CPPD LABS SHOW NORMAL WBC COUNT WITH ELEVATED SED RATE SUGGESTS INFLAMMATORY PROCESS. HAVE ORTHO TECH IMMOBILIZE WRIST HOLD OUT FROM WORK.  CONTACT ME DIRECTLY IF QUESTIONS, PLEASE DO NOT CALL OFFICE AS IT WILL SAVE TIME TO CALL ME DIRECTLY OR TEXT AT [phone].

## 2012-08-21 NOTE — H&P (Signed)
Hospital Admission Note Date: 08/21/2012  Patient name: Luke Dunn Medical record number: [id number] Date of birth: Jul 28, 1950 Age: 62 y.o. Gender: male PCP: Elnora Morrison, MD  Medical Service: Internal Medicine Teaching Service  Attending physician: Dr. Murlean Caller    1st Contact: Orpha Bur   Pager: [phone] 2nd Contact: Elnora Morrison   Pager: [phone] After 5 pm or weekends: 1st Contact:      Pager: [phone] 2nd Contact:      Pager: [phone]  Chief Complaint: Right wrist pain  History of Present Illness: The patient is a 62 y.o. male with a history of gout, HTN, CKD, presenting with R wrist pain. Three days ago, the patient was at work, lifting heavy objects (potted plants). At the end of the day, the patient noticed diffuse right wrist pain. He notes no acute onset of pain, no trauma, and no wrist "popping" sensation. He describes the pain as a dull, aching pain, 10/10 in severity, radiating to his right radial forearm. He also notes numbness of his fingers, both palmar and dorsal surfaces, to the level of his wrist, which has been present since that time. He notes weakness of that hand, unable to make a fist (due to both pain and weakness), as well as erythema of his wrist. He notes no subjective fevers, but does note new night sweats for the last 3 days. He has been unable to return to work due to the pain. The patient was seen in [facility], and joint aspiration was performed, which yielded scant straw-colored fluid, which was sent for culture. The patient was admitted out of concern for septic joint.  Meds: Medications Prior to Admission  Medication Sig Dispense Refill  . amLODipine (NORVASC) 10 MG tablet Take 10 mg by mouth daily.      . predniSONE (DELTASONE) 10 MG tablet Take 10 mg by mouth daily.      . traMADol (ULTRAM) 50 MG tablet Take 50 mg by mouth every 6 (six) hours as needed for pain.      Marland Kitchen allopurinol (ZYLOPRIM) 100 MG tablet Take 1 tablet (100 mg total) by mouth daily.  30 tablet  11  .  furosemide (LASIX) 20 MG tablet Take 1 tablet (20 mg total) by mouth daily.  90 tablet  3  . lisinopril (PRINIVIL,ZESTRIL) 5 MG tablet Take 1 tablet (5 mg total) by mouth daily.  90 tablet  3  . [DISCONTINUED] amLODipine (NORVASC) 10 MG tablet Take 10 mg by mouth daily.      . [DISCONTINUED] predniSONE (DELTASONE) 10 MG tablet Take 3 tablets per day for days 1-4, then 2 tabs/day for days 5-7, then 1 tab/day for days 8-10  21 tablet  0  . [DISCONTINUED] traMADol (ULTRAM) 50 MG tablet Take 1-2 tablets (50-100 mg total) by mouth every 6 (six) hours as needed for pain.  60 tablet  0    Allergies: Allergies as of 08/21/2012  . (No Known Allergies)   Past Medical History  Diagnosis Date  . Gout   . Chronic renal insufficiency     Followed by [facility].  Thought to be due to long-standing HTN  . Essential hypertension    No past surgical history on file. No family history on file. History   Social History  . Marital Status: Single    Spouse Name: N/A    Number of Children: N/A  . Years of Education: N/A   Occupational History  . Not on file.   Social History Main Topics  .  Smoking status: Former Smoker -- 1.00 packs/day for 37 years    Quit date: 02/01/2007  . Smokeless tobacco: Former Systems developer    Quit date: 02/01/2007  . Alcohol Use: No  . Drug Use: No  . Sexually Active: Not on file   Other Topics Concern  . Not on file   Social History Narrative   [name] speaking only, low educational level, works in Architect.  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       Financial assistance approved for 70% discount at [facility] and has [facility] card.     Review of Systems: General: no fevers, changes in weight, changes in appetite  Skin: no rash  HEENT: no blurry vision, hearing changes, sore throat  Pulm: no dyspnea, coughing, wheezing  CV: no chest pain, palpitations, shortness of breath  Abd: no abdominal pain, nausea/vomiting, diarrhea/constipation  GU: no dysuria, hematuria, polyuria   Ext: see HPI  Neuro: see HPI  Physical Exam: Blood pressure 191/108, pulse 76, temperature 98 F (36.7 C), resp. rate 20, SpO2 100.00%. General: alert, cooperative, appears nervous HEENT: pupils equal round and reactive to light, vision grossly intact, oropharynx clear and non-erythematous  Neck: supple, no lymphadenopathy Lungs: clear to ascultation bilaterally, normal work of respiration, no wheezes, rales, ronchi Heart: regular rate and rhythm, no murmurs, gallops, or rubs Abdomen: soft, non-tender, non-distended, normal bowel sounds  Extremities: Right wrist diffusely edematous, with large patches of erythema noted on radial wrist and dorsal wrist. Active and passive ROM limited by pain. No pain elicited on palpation of elbow or MCP, PIP, or DIP. Neurologic: alert & oriented X3, cranial nerves II-XII intact, right wrist strength 4/5, otherwise strength 5/5. Sensation to pinprick diminished in right hand from the wrist to the fingertips. Otherwise, strength and sensation preserved throughout.  Lab results: Basic Metabolic Panel: No results found for this basename: NA, K, CL, CO2, GLUCOSE, BUN, CREATININE, CALCIUM, MG, PHOS,  in the last 72 hours Liver Function Tests: No results found for this basename: AST, ALT, ALKPHOS, BILITOT, PROT, ALBUMIN,  in the last 72 hours No results found for this basename: LIPASE, AMYLASE,  in the last 72 hours No results found for this basename: AMMONIA,  in the last 72 hours CBC: No results found for this basename: WBC, NEUTROABS, HGB, HCT, MCV, PLT,  in the last 72 hours  Imaging results:  Dg Wrist Complete Right  08/21/2012  *RADIOLOGY REPORT*  Clinical Data: Joint swelling  RIGHT WRIST - COMPLETE 3+ VIEW  Comparison: None.  Findings: There is a chronic small bony deformity adjacent to the articular surface of the palmar aspect of the distal radius.  Mild degenerative changes in the carpal bones.  No acute fracture and no dislocation.  Mild degenerative  changes at the metacarpal phalangeal joint of the thumb.  IMPRESSION: No acute bony pathology.  Chronic changes.   Original Report Authenticated By: Marybelle Killings, M.D.     Assessment & Plan by Problem: The patient is a 62 yo M, history of gout, CKD, presenting with right wrist pain.   # Right wrist pain - The patient presents with a 3-day history of worsening right wrist pain, with edema, streaking erythema, as well as numbness and weakness of his right hand. Our greatest concern for this time is for septic joint, given the above symptoms as well as night sweats (though no fever). Additionally, his neurologic symptoms raise concern for early compartment syndrome. Other differential diagnoses include gout (history of the same) vs fracture (though no  reported trauma) vs reactive arthritis. We performed a joint aspiration in clinic today, which yielded <1 cc of straw-colored fluid, which has been sent for culture (too little fluid to send for cell count or other labs).  -admit to inpatient  -check CBC, ESR, BMET  -x-ray R wrist  -consult hand surgery for possible septic joint/concern for compartment syndrome  -await synovial fluid culture, if they're able to perform with small amount of fluid  -consider IR re-aspiration of joint, if possible   # HTN - the patient has a history of poorly-controlled HTN. The patient's BP is elevated today, likely due to pain, as well as confessed non-compliance with BP medications today.  -give lisinopril and lasix now  -can add amlodipine later if BP remains elevated  -treat pain, norco prn   # CKD - the patient has a history of CKD, thought to be secondary to a history of poorly-controlled HTN.  -check BMET  -renally dose medications   # Gout - the patient has a history of gout. Current symptoms may represent a gout flare (see above). Avoiding NSAIDs and colchicine due to CKD. Will avoid prednisone as well, as it could worsen septic joint.  -continue allopurinol    # Prophy - heparin   Dispo: Disposition is deferred at this time, awaiting improvement of current medical problems.  The patient does have a current PCP (Chrysten Woulfe, MD), therefore will be requiring OPC follow-up after discharge.   The patient does not have transportation limitations that hinder transportation to clinic appointments.  SignedElnora Morrison 08/21/2012, 7:27 PM

## 2012-08-22 DIAGNOSIS — N189 Chronic kidney disease, unspecified: Secondary | ICD-10-CM

## 2012-08-22 DIAGNOSIS — I129 Hypertensive chronic kidney disease with stage 1 through stage 4 chronic kidney disease, or unspecified chronic kidney disease: Secondary | ICD-10-CM

## 2012-08-22 DIAGNOSIS — D649 Anemia, unspecified: Secondary | ICD-10-CM

## 2012-08-22 LAB — BASIC METABOLIC PANEL
CO2: 28 mEq/L (ref 19–32)
Chloride: 105 mEq/L (ref 96–112)
GFR calc Af Amer: 30 mL/min — ABNORMAL LOW (ref >90)
Potassium: 4 mEq/L (ref 3.5–5.1)
Sodium: 141 mEq/L (ref 135–145)

## 2012-08-22 LAB — CBC
HCT: 30.6 % — ABNORMAL LOW (ref 39.0–52.0)
MCV: 78.9 fL (ref 78.0–100.0)
RBC: 3.88 MIL/uL — ABNORMAL LOW (ref 4.22–5.81)
WBC: 5.6 10*3/uL (ref 4.0–10.5)

## 2012-08-22 MED ORDER — PREDNISONE 10 MG PO TABS: ORAL_TABLET | ORAL | Status: DC

## 2012-08-22 MED ORDER — PREDNISONE 20 MG PO TABS
40.0000 mg | ORAL_TABLET | Freq: Once | ORAL | Status: AC
  Administered 2012-08-22: 40 mg via ORAL
  Filled 2012-08-22: qty 2

## 2012-08-22 NOTE — Progress Notes (Signed)
Medical Student Daily Progress Note  Subjective: Luke Dunn states he is doing much better today and has improvement in his level of pain and range of motion.  He denies any fever, vomiting or pain.  He was able to sleep well and had no new discomfort   Objective: Vital signs in last 24 hours: Filed Vitals:   08/21/12 1821 08/21/12 2100 08/22/12 0606 08/22/12 1056  BP: 191/108 181/105 149/89 176/88  Pulse: 76 74 72   Temp: 98 F (36.7 C) 98.2 F (36.8 C) 98.3 F (36.8 C)   TempSrc:  Oral Oral   Resp: 20 19 19    SpO2: 100% 100% 100%    Weight change:   Intake/Output Summary (Last 24 hours) at 08/22/12 1424 Last data filed at 08/22/12 0700  Gross per 24 hour  Intake    240 ml  Output      0 ml  Net    240 ml   Physical Exam: BP 176/88  Pulse 72  Temp(Src) 98.3 F (36.8 C) (Oral)  Resp 19  SpO2 100% General appearance: alert and cooperative Head: Normocephalic, without obvious abnormality, atraumatic Eyes: conjunctivae/corneas clear. PERRL, EOM's intact. Fundi benign. Lungs: clear to auscultation bilaterally Heart: regular rate and rhythm, S1, S2 normal, no murmur, click, rub or gallop Abdomen: soft, non-tender; bowel sounds normal; no masses,  no organomegaly Extremities: Right wrist mildly edematous and mildly TTP primarily over right medial wrist. Mildly restricted range of motion in right wrist.  Left wrist exam normal. Pulses: 2+ and symmetric Lab Results:  BMET    Component Value Date/Time   NA 141 08/22/2012 0510   K 4.0 08/22/2012 0510   CL 105 08/22/2012 0510   CO2 28 08/22/2012 0510   GLUCOSE 97 08/22/2012 0510   BUN 30* 08/22/2012 0510   CREATININE 2.46* 08/22/2012 0510   CREATININE 3.09* 01/18/2012 1630   CALCIUM 9.2 08/22/2012 0510   GFRNONAA 26* 08/22/2012 0510   GFRAA 30* 08/22/2012 0510    Micro Results: Recent Results (from the past 240 hour(s))  BODY FLUID CULTURE     Status: None   Collection Time    08/21/12  5:18 PM      Result Value Range Status   GRAM  STAIN No WBC Seen   Preliminary   GRAM STAIN No Organisms Seen   Preliminary   Preliminary Report NO GROWTH   Preliminary   Studies/Results: Dg Wrist Complete Right  08/21/2012  *RADIOLOGY REPORT*  Clinical Data: Joint swelling  RIGHT WRIST - COMPLETE 3+ VIEW  Comparison: None.  Findings: There is a chronic small bony deformity adjacent to the articular surface of the palmar aspect of the distal radius.  Mild degenerative changes in the carpal bones.  No acute fracture and no dislocation.  Mild degenerative changes at the metacarpal phalangeal joint of the thumb.  IMPRESSION: No acute bony pathology.  Chronic changes.   Original Report Authenticated By: Marybelle Killings, M.D.    Medications: I have reviewed the patient's current medications. Scheduled Meds: . allopurinol  100 mg Oral Daily  . furosemide  20 mg Oral Daily  . heparin  5,000 Units Subcutaneous Q8H  . lisinopril  5 mg Oral Daily  . sodium chloride  3 mL Intravenous Q12H   Continuous Infusions:  PRN Meds:.sodium chloride, HYDROcodone-acetaminophen, ondansetron (ZOFRAN) IV, ondansetron, sodium chloride  Assessment & Plan by Problem: Luke Dunn is a 62yo man presenting with right wrist pain who is much improved.  1. Right wrist pain  Evaluation of his right wrist pain was negative for fracture by X-ray.  CBC was within normal limits, and microbiology studies did not grow any organisms, and ESR was elevated, suggesting an inflammatory but not infectious process.  Hand surgery was involved and evaluated the patient and did not feel his wrist pain was attritutable to infection or compartment syndrome and was likely due to an acute gout flare.  Given his improvement in wrist pain, and no evidence of infection, we feel he is safe to be discharged home today.  Plan. - Blood culture neg x2 - Negative wrist x-ray - CBC showed normal WBC count, ESR 88 - Joint fluid culture negative for growth - Plan for discharge home with velcro wrist splint  and work note for light activity.  2. Gout Patient has history of gout flares in past and this episode of wrist pain is attributed to his gout.  He was treated with allopurinol during this hospitalization and will be maintained on his regular regimen on discharge  Plan -Allopurinol 100mg  po daily  3. Hypertension Patient BP remains elevated, likely due to recent medication noncompliance.  Will maintain patient on home medication  - Lasix 20mg  po daily - Lisinopril 5mg  po daily  4. Chronic Kidney Disease Patient is followed by [facility], will assess patients renal function with CMP and consult nephrology as needed.   LOS: 1 day   This is a Careers information officer Note.  The care of the patient was discussed with Dr. Owens Shark and the assessment and plan formulated with their assistance.  Please see their attached note for official documentation of the daily encounter.  Luke Dunn, Luke Dunn 08/22/2012, 2:24 PM

## 2012-08-22 NOTE — H&P (Signed)
INTERNAL MEDICINE TEACHING SERVICE Attending Admission Note  Date: 08/22/2012  Patient name: Luke Dunn  Medical record number: [id number]  Date of birth: 10-Dec-1950    I have seen and evaluated Luke Dunn and discussed their care with the Residency Team.  42 yr. Old AAM with hx of gout, HTN, CKD 3, presented to clinic with right wrist pain.  He admitted to a recent history of heavy lifting with worsening of the pain throughout the day.  He denied any trauma.  He also admitted to numbness of fingers and palmar/dorsal aspects of his wrist.  He was noted to have some erythema of his wrist, but no fever. He admitted to night sweats over the past few days.   He underwent a joint aspiration in clinic which yielded a small amount of straw colored fluid. This was sent for gram stain and culture, but could not be sent for cell count and crystal exam due to small amount.  He was admitted due to concern for septic joint.  This morning he feels better. He speaks a foreign language but does communicate with some english. He states the wrist is "better".  Physical Exam: Blood pressure 149/89, pulse 72, temperature 98.3 F (36.8 C), temperature source Oral, resp. rate 19, SpO2 100.00%.  General: Vital signs reviewed and noted. Well-developed, well-nourished, in no acute distress; alert, appropriate and cooperative throughout examination.  Head: Normocephalic, atraumatic.  Eyes: PERRL, EOMI, No signs of anemia or jaundince.  Nose: Mucous membranes moist, not inflammed, nonerythematous.  Throat: Oropharynx nonerythematous, no exudate appreciated.   Neck: No deformities, masses, or tenderness noted.Supple, No carotid Bruits, no JVD.  Lungs:  Normal respiratory effort. Clear to auscultation BL without crackles or wheezes.  Heart: RRR. S1 and S2 normal without gallop, murmur, or rubs.  Abdomen:  BS normoactive. Soft, Nondistended, non-tender.  No masses or organomegaly.  Extremities: Right wrist with limited ROM, no  erythema, no warmth, mild edema.  Neurologic: A&O X3, CN II - XII are grossly intact. Motor strength is 5/5 in the all 4 extremities, Sensations intact to light touch, Cerebellar signs negative.  Skin: No visible rashes, scars.    Lab results: Results for orders placed during the hospital encounter of 08/21/12 (from the past 24 hour(s))  COMPREHENSIVE METABOLIC PANEL     Status: Abnormal   Collection Time    08/21/12  7:37 PM      Result Value Range   Sodium 138  135 - 145 mEq/L   Potassium 4.1  3.5 - 5.1 mEq/L   Chloride 104  96 - 112 mEq/L   CO2 25  19 - 32 mEq/L   Glucose, Bld 101 (*) 70 - 99 mg/dL   BUN 34 (*) 6 - 23 mg/dL   Creatinine, Ser 2.44 (*) 0.50 - 1.35 mg/dL   Calcium 9.0  8.4 - 10.5 mg/dL   Total Protein 8.0  6.0 - 8.3 g/dL   Albumin 3.5  3.5 - 5.2 g/dL   AST 56 (*) 0 - 37 U/L   ALT 15  0 - 53 U/L   Alkaline Phosphatase 90  39 - 117 U/L   Total Bilirubin 0.2 (*) 0.3 - 1.2 mg/dL   GFR calc non Af Amer 26 (*) >90 mL/min   GFR calc Af Amer 31 (*) >90 mL/min  CBC WITH DIFFERENTIAL     Status: Abnormal   Collection Time    08/21/12  7:37 PM      Result Value Range   WBC  6.5  4.0 - 10.5 K/uL   RBC 3.95 (*) 4.22 - 5.81 MIL/uL   Hemoglobin 10.1 (*) 13.0 - 17.0 g/dL   HCT 31.2 (*) 39.0 - 52.0 %   MCV 79.0  78.0 - 100.0 fL   MCH 25.6 (*) 26.0 - 34.0 pg   MCHC 32.4  30.0 - 36.0 g/dL   RDW 14.1  11.5 - 15.5 %   Platelets 171  150 - 400 K/uL   Neutrophils Relative 64  43 - 77 %   Neutro Abs 4.2  1.7 - 7.7 K/uL   Lymphocytes Relative 22  12 - 46 %   Lymphs Abs 1.4  0.7 - 4.0 K/uL   Monocytes Relative 8  3 - 12 %   Monocytes Absolute 0.5  0.1 - 1.0 K/uL   Eosinophils Relative 6 (*) 0 - 5 %   Eosinophils Absolute 0.4  0.0 - 0.7 K/uL   Basophils Relative 0  0 - 1 %   Basophils Absolute 0.0  0.0 - 0.1 K/uL  PROTIME-INR     Status: None   Collection Time    08/21/12  7:37 PM      Result Value Range   Prothrombin Time 14.0  11.6 - 15.2 seconds   INR 1.09  0.00 - 1.49   APTT     Status: Abnormal   Collection Time    08/21/12  7:37 PM      Result Value Range   aPTT 42 (*) 24 - 37 seconds  SEDIMENTATION RATE     Status: Abnormal   Collection Time    08/21/12  7:37 PM      Result Value Range   Sed Rate 88 (*) 0 - 16 mm/hr  BASIC METABOLIC PANEL     Status: Abnormal   Collection Time    08/22/12  5:10 AM      Result Value Range   Sodium 141  135 - 145 mEq/L   Potassium 4.0  3.5 - 5.1 mEq/L   Chloride 105  96 - 112 mEq/L   CO2 28  19 - 32 mEq/L   Glucose, Bld 97  70 - 99 mg/dL   BUN 30 (*) 6 - 23 mg/dL   Creatinine, Ser 2.46 (*) 0.50 - 1.35 mg/dL   Calcium 9.2  8.4 - 10.5 mg/dL   GFR calc non Af Amer 26 (*) >90 mL/min   GFR calc Af Amer 30 (*) >90 mL/min  CBC     Status: Abnormal   Collection Time    08/22/12  5:10 AM      Result Value Range   WBC 5.6  4.0 - 10.5 K/uL   RBC 3.88 (*) 4.22 - 5.81 MIL/uL   Hemoglobin 9.9 (*) 13.0 - 17.0 g/dL   HCT 30.6 (*) 39.0 - 52.0 %   MCV 78.9  78.0 - 100.0 fL   MCH 25.5 (*) 26.0 - 34.0 pg   MCHC 32.4  30.0 - 36.0 g/dL   RDW 14.1  11.5 - 15.5 %   Platelets 158  150 - 400 K/uL    Imaging results:  Dg Wrist Complete Right  08/21/2012  *RADIOLOGY REPORT*  Clinical Data: Joint swelling  RIGHT WRIST - COMPLETE 3+ VIEW  Comparison: None.  Findings: There is a chronic small bony deformity adjacent to the articular surface of the palmar aspect of the distal radius.  Mild degenerative changes in the carpal bones.  No acute fracture and no dislocation.  Mild degenerative  changes at the metacarpal phalangeal joint of the thumb.  IMPRESSION: No acute bony pathology.  Chronic changes.   Original Report Authenticated By: Marybelle Killings, M.D.      Assessment and Plan: I agree with the formulated Assessment and Plan with the following changes:  11 yr. Old AAM with hx of gout, HTN, CKD 3, presented to clinic with right wrist pain. 1) Right wrist pain: Doubt septic joint, but culture should be followed. He has improved with  steroid treatment. No NSAIDs due to CKD. He has been evaluated by hand surgery who recommended immobilization, elevation and treatment for gout.  I agree. 2) Anemia: Should be followed as an outpatient. Rule out iron deficiency. Needs colonoscopy if none in past ten years. May be component of CKD. -rest per resident note. May be discharged today with follow up. Dominic Pea, DO 4/3/201410:36 AM

## 2012-08-22 NOTE — Progress Notes (Signed)
Orthopedic Tech Progress Note Patient Details:  Luke Dunn [birth year] [id number] Wrist splint applied to Right wrist. Application demonstrated several times so patient understands. Language barrier.  Ortho Devices Type of Ortho Device: Velcro wrist splint Ortho Device/Splint Location: Right Ortho Device/Splint Interventions: Application   Asia R Thompson 08/22/2012, 1:58 PM

## 2012-08-22 NOTE — Progress Notes (Signed)
Utilization review completed. Fallen Crisostomo, RN, BSN. 

## 2012-08-22 NOTE — Progress Notes (Signed)
Resident Co-sign Daily Note: I have seen the patient and reviewed the excellent daily progress note by Orpha Bur, MS4 and discussed the care of the patient with them.  See below for documentation of my findings, assessment, and plans.  Subjective: The patient was admitted yesterday for acute hand pain, to rule out septic joint.  Joint aspiration culture shows preliminary results with no WBC's or organisms.  Dr. Caralyn Guile evaluated the patient, and agreed that septic joint was unlikely.  The patient was started on prednisone, and experienced significant pain relief overnight.  Through the help of an interpreter, we discussed the patient's care with him this morning, and explained his condition.  Objective: Vital signs in last 24 hours: Filed Vitals:   08/21/12 1821 08/21/12 2100 08/22/12 0606 08/22/12 1056  BP: 191/108 181/105 149/89 176/88  Pulse: 76 74 72   Temp: 98 F (36.7 C) 98.2 F (36.8 C) 98.3 F (36.8 C)   TempSrc:  Oral Oral   Resp: 20 19 19    SpO2: 100% 100% 100%    Physical Exam: General: alert, cooperative, appears much more comfortable HEENT: pupils equal round and reactive to light, vision grossly intact, oropharynx clear and non-erythematous  Neck: supple, no lymphadenopathy Lungs: clear to ascultation bilaterally, normal work of respiration, no wheezes, rales, ronchi Heart: regular rate and rhythm, no murmurs, gallops, or rubs Abdomen: soft, non-tender, non-distended, normal bowel sounds  Extremities: Right wrist mildly less edematous, with significant improvement in wrist erythema. Active and passive ROM still limited by pain. No pain elicited on palpation of elbow or MCP, PIP, or DIP. Neurologic: alert & oriented X3, cranial nerves II-XII intact, right wrist strength 4/5, otherwise strength 5/5. Sensation to pinprick still somewhat diminished in right hand from the wrist to the fingertips. Otherwise, strength and sensation preserved throughout.  Lab Results: Reviewed  and documented in Electronic Record Micro Results: Reviewed and documented in Electronic Record Studies/Results: Reviewed and documented in Electronic Record Medications: I have reviewed the patient's current medications. Scheduled Meds: . allopurinol  100 mg Oral Daily  . furosemide  20 mg Oral Daily  . heparin  5,000 Units Subcutaneous Q8H  . lisinopril  5 mg Oral Daily  . sodium chloride  3 mL Intravenous Q12H   Continuous Infusions:  PRN Meds:.sodium chloride, HYDROcodone-acetaminophen, ondansetron (ZOFRAN) IV, ondansetron, sodium chloride  Assessment/Plan: The patient is a 62 yo M, history of gout, CKD, presenting with right wrist pain.   # Acute on Chronic Gout - The patient presented with a 3-day history of worsening right wrist pain, edema, and erythema, as well as weakness and mild sensory loss.   We were initially concerned for septic joint, but joint aspiration fluid showed no WBC's or organisms, the patient's serum WBC count was normal, and the patient was afebrile, essentially ruling out this diagnosis.  A far more likely diagnosis is acute gout flare, further supported by the elevated ESR.  X-ray showed no evidence of fracture. -greatly appreciate assistance by Dr. Caralyn Guile -joint fluid aspirate shows no WBC and no organisms, awaiting final read -started prednisone 4/2, continue taper -wrist splint applied today  # HTN - the patient has a history of poorly-controlled HTN. The patient's BP is elevated today, likely due to pain, as well as confessed non-compliance with BP medications today.  -continue lasix and lisinopril -can add amlodipine later if BP remains elevated  -treat pain, norco prn   # CKD - the patient has a history of CKD, thought to be secondary to  a history of poorly-controlled HTN.  -renally dose medications   # Anemia - the patient's Hb has dropped from around 12 to around 10 in the last 4 years, likely representing anemia of chronic disease.  This  worsening of anemia mirrors the patient's worsening kidney failure.  The patient has never had a colonoscopy, due to cost concerns, but fecal occult blood testing was negative in 2010.  -will follow-up as outpatient  # Prophy - heparin   Dispo: Disposition is deferred at this time, awaiting improvement of current medical problems.   The patient does have a current PCP (Alfonso Shackett, MD), therefore will be requiring OPC follow-up after discharge.   The patient does not have transportation limitations that hinder transportation to clinic appointments    LOS: 1 day   Elnora Morrison 08/22/2012, 2:42 PM

## 2012-08-22 NOTE — H&P (Deleted)
Medical Student Daily Progress Note  Subjective: Luke Dunn states he is doing much better today and has improvement in his level of pain and range of motion.  He denies any fever, vomiting or pain.  He was able to sleep well and had no new discomfort   Objective: Vital signs in last 24 hours: Filed Vitals:   08/21/12 1821 08/21/12 2100 08/22/12 0606 08/22/12 1056  BP: 191/108 181/105 149/89 176/88  Pulse: 76 74 72   Temp: 98 F (36.7 C) 98.2 F (36.8 C) 98.3 F (36.8 C)   TempSrc:  Oral Oral   Resp: 20 19 19    SpO2: 100% 100% 100%    Weight change:   Intake/Output Summary (Last 24 hours) at 08/22/12 1424 Last data filed at 08/22/12 0700  Gross per 24 hour  Intake    240 ml  Output      0 ml  Net    240 ml   Physical Exam: BP 176/88  Pulse 72  Temp(Src) 98.3 F (36.8 C) (Oral)  Resp 19  SpO2 100% General appearance: alert and cooperative Head: Normocephalic, without obvious abnormality, atraumatic Eyes: conjunctivae/corneas clear. PERRL, EOM's intact. Fundi benign. Lungs: clear to auscultation bilaterally Heart: regular rate and rhythm, S1, S2 normal, no murmur, click, rub or gallop Abdomen: soft, non-tender; bowel sounds normal; no masses,  no organomegaly Extremities: Right wrist mildly edematous and mildly TTP primarily over right medial wrist. Mildly restricted range of motion in right wrist.  Left wrist exam normal. Pulses: 2+ and symmetric Lab Results:  BMET    Component Value Date/Time   NA 141 08/22/2012 0510   K 4.0 08/22/2012 0510   CL 105 08/22/2012 0510   CO2 28 08/22/2012 0510   GLUCOSE 97 08/22/2012 0510   BUN 30* 08/22/2012 0510   CREATININE 2.46* 08/22/2012 0510   CREATININE 3.09* 01/18/2012 1630   CALCIUM 9.2 08/22/2012 0510   GFRNONAA 26* 08/22/2012 0510   GFRAA 30* 08/22/2012 0510    Micro Results: Recent Results (from the past 240 hour(s))  BODY FLUID CULTURE     Status: None   Collection Time    08/21/12  5:18 PM      Result Value Range Status   GRAM  STAIN No WBC Seen   Preliminary   GRAM STAIN No Organisms Seen   Preliminary   Preliminary Report NO GROWTH   Preliminary   Studies/Results: Dg Wrist Complete Right  08/21/2012  *RADIOLOGY REPORT*  Clinical Data: Joint swelling  RIGHT WRIST - COMPLETE 3+ VIEW  Comparison: None.  Findings: There is a chronic small bony deformity adjacent to the articular surface of the palmar aspect of the distal radius.  Mild degenerative changes in the carpal bones.  No acute fracture and no dislocation.  Mild degenerative changes at the metacarpal phalangeal joint of the thumb.  IMPRESSION: No acute bony pathology.  Chronic changes.   Original Report Authenticated By: Luke Dunn, M.D.    Medications: I have reviewed the patient's current medications. Scheduled Meds: . allopurinol  100 mg Oral Daily  . furosemide  20 mg Oral Daily  . heparin  5,000 Units Subcutaneous Q8H  . lisinopril  5 mg Oral Daily  . sodium chloride  3 mL Intravenous Q12H   Continuous Infusions:  PRN Meds:.sodium chloride, HYDROcodone-acetaminophen, ondansetron (ZOFRAN) IV, ondansetron, sodium chloride  Assessment & Plan by Problem: Luke Dunn is a 62yo man presenting with right wrist pain who is much improved.  1. Right wrist pain  Evaluation of his right wrist pain was negative for fracture by X-ray.  CBC was within normal limits, and microbiology studies did not grow any organisms, and ESR was elevated, suggesting an inflammatory but not infectious process.  Hand surgery was involved and evaluated the patient and did not feel his wrist pain was attritutable to infection or compartment syndrome and was likely due to an acute gout flare.  Given his improvement in wrist pain, and no evidence of infection, we feel he is safe to be discharged home today.  Plan. - Blood culture neg x2 - Negative wrist x-ray - CBC showed normal WBC count, ESR 88 - Joint fluid culture negative for growth - Plan for discharge home with velcro wrist splint  and work note for light activity.  2. Gout Patient has history of gout flares in past and this episode of wrist pain is attributed to his gout.  He was treated with allopurinol during this hospitalization and will be maintained on his regular regimen on discharge  Plan -Allopurinol 100mg  po daily  3. Hypertension Patient BP remains elevated, likely due to recent medication noncompliance.  Will maintain patient on home medication  - Lasix 20mg  po daily - Lisinopril 5mg  po daily  4. Chronic Kidney Disease Patient is followed by [facility], will assess patients renal function with CMP and consult nephrology as needed.   LOS: 1 day   This is a Careers information officer Note.  The care of the patient was discussed with Luke Dunn and the assessment and plan formulated with their assistance.  Please see their attached note for official documentation of the daily encounter.  Luke Dunn, [name] 08/22/2012, 2:24 PM

## 2012-08-22 NOTE — Discharge Summary (Signed)
[facility] Discharge Note  Name: Luke Dunn MRN: [id number] DOB: 1951-04-11 62 y.o.  Date of Admission: 08/21/2012  6:15 PM Date of Discharge: 08/22/2012 Attending Physician: Dominic Pea, DO  Discharge Diagnosis: 1. Acute on Chronic gout - admitted to r/o septic joint, improved with prednisone 2. Hypertension - elevated 2/2 acute pain and med non-compliance, improved 3. Anemia - likely anemia of chronic disease 4. Chronic Kidney Disease  Discharge Medications:   Medication List    STOP taking these medications       amLODipine 10 MG tablet  Commonly known as:  NORVASC      TAKE these medications       allopurinol 100 MG tablet  Commonly known as:  ZYLOPRIM  Take 1 tablet (100 mg total) by mouth daily.     furosemide 20 MG tablet  Commonly known as:  LASIX  Take 1 tablet (20 mg total) by mouth daily.     lisinopril 5 MG tablet  Commonly known as:  PRINIVIL,ZESTRIL  Take 1 tablet (5 mg total) by mouth daily.     predniSONE 10 MG tablet  Commonly known as:  DELTASONE  Take 3 tablets per day for days 1-4, then 2 tabs/day for days 5-7, then 1 tab/day for days 8-10     traMADol 50 MG tablet  Commonly known as:  ULTRAM  Take 50 mg by mouth every 6 (six) hours as needed for pain.        Disposition and follow-up:   [name] was discharged from [facility] in stable and improved condition, with improvement in right wrist pain.  At hospital follow-up please address the following: -ensure resolution of gout flare -please provide patient with fecal occult blood cards to return, given patient's worsening anemia (see below) -consider checking ferritin and iron levels, given anemia with low MCV  Follow-up Appointments: Discharge Orders   Future Appointments Provider Department Dept Phone   08/28/2012 3:45 PM Annamarie Dawley, DO [employer] [phone]   Future Orders Complete By Expires      Call MD for:  persistant nausea and vomiting  As directed     Call MD for:  redness, tenderness, or signs of infection (pain, swelling, redness, odor or green/yellow discharge around incision site)  As directed     Call MD for:  severe uncontrolled pain  As directed     Call MD for:  temperature >100.4  As directed     Diet - low sodium heart healthy  As directed     Discharge instructions  As directed     Comments:      We are prescribing prednisone (a steroid) to help with your wrist pain.  Please take this medication as directed.  Take 3 tablets per day for days 1-4, then 2 tabs/day for days 5-7, then 1 tab/day for days 8-10.  Please do not stop taken this medication as suddenly stopping this medication to lead to side effects.  You have been given a work note and should not work until Monday and then may resume light duties.  You may increase activity as tolerated.  You may ice the wrist for relief and you will also be given a splint to immobilize your wrist..    Increase activity slowly  As directed     Lifting restrictions  As directed     Comments:      No lifting until Monday and then may resume light  duty (less than 10lbs for the following week).  Resume normal activity as tolerated       Consultations: Hand Orthopedics Caralyn Guile)  Procedures Performed:  Dg Wrist Complete Right  08/21/2012  *RADIOLOGY REPORT*  Clinical Data: Joint swelling  RIGHT WRIST - COMPLETE 3+ VIEW  Comparison: None.  Findings: There is a chronic small bony deformity adjacent to the articular surface of the palmar aspect of the distal radius.  Mild degenerative changes in the carpal bones.  No acute fracture and no dislocation.  Mild degenerative changes at the metacarpal phalangeal joint of the thumb.  IMPRESSION: No acute bony pathology.  Chronic changes.   Original Report Authenticated By: Marybelle Killings, M.D.     Admission HPI:  The patient is a 62 y.o. male with a history of gout, HTN, CKD, presenting with R  wrist pain. Three days ago, the patient was at work, lifting heavy objects (potted plants). At the end of the day, the patient noticed diffuse right wrist pain. He notes no acute onset of pain, no trauma, and no wrist "popping" sensation. He describes the pain as a dull, aching pain, 10/10 in severity, radiating to his right radial forearm. He also notes numbness of his fingers, both palmar and dorsal surfaces, to the level of his wrist, which has been present since that time. He notes weakness of that hand, unable to make a fist (due to both pain and weakness), as well as erythema of his wrist. He notes no subjective fevers, but does note new night sweats for the last 3 days. He has been unable to return to work due to the pain. The patient was seen in [facility], and joint aspiration was performed, which yielded scant straw-colored fluid, which was sent for culture. The patient was admitted out of concern for septic joint.  Admission Physical Exam Blood pressure 191/108, pulse 76, temperature 98 F (36.7 C), resp. rate 20, SpO2 100.00%.  General: alert, cooperative, appears nervous HEENT: pupils equal round and reactive to light, vision grossly intact, oropharynx clear and non-erythematous  Neck: supple, no lymphadenopathy Lungs: clear to ascultation bilaterally, normal work of respiration, no wheezes, rales, ronchi Heart: regular rate and rhythm, no murmurs, gallops, or rubs Abdomen: soft, non-tender, non-distended, normal bowel sounds  Extremities: Right wrist diffusely edematous, with large patches of erythema noted on radial wrist and dorsal wrist. Active and passive ROM limited by pain. No pain elicited on palpation of elbow or MCP, PIP, or DIP. Neurologic: alert & oriented X3, cranial nerves II-XII intact, right wrist strength 4/5, otherwise strength 5/5. Sensation to pinprick diminished in right hand from the wrist to the fingertips. Otherwise, strength and sensation preserved throughout.  Admission  Labs  Recent Labs Lab 08/21/12 1937 08/22/12 0510  NA 138 141  K 4.1 4.0  CL 104 105  CO2 25 28  GLUCOSE 101* 97  BUN 34* 30*  CREATININE 2.44* 2.46*  CALCIUM 9.0 9.2    Recent Labs Lab 08/21/12 1937 08/22/12 0510  HGB 10.1* 9.9*  HCT 31.2* 30.6*  WBC 6.5 5.6  PLT 171 158   ESR: 88  Hospital Course by problem list: 1. Acute on Chronic Gout - The patient was admitted with a 3-day history of worsening right wrist pain, with edema, streaking erythema, as well as numbness and weakness of his right hand.   He was evaluated out of concern for a potential septic joint.  Wrist imaging showed no fracture or acute pathology.  Aspiration of joint fluid was  performed, and did not grow any organisms.  Additionally, he did not have an elevated white count or fever.  He did show an elevated sedimentation rate of 88 on admission.  Hand surgery was consulted and evaluated the patient and they felt strongly that there was no concern for infection of the joint or compartment syndrome, and that the wrist pain was attributable to a gout flare.  The patient was treated with prednisone inpatient and given Norco prn for pain.  His pain was greatly improved the next day and he was discharged home in a wrist splint with prednisone taper.     2. HTN - The patient has a history of poorly-controlled HTN and had an elevated BP at 190/110 on admission, likely due to pain, as well as confessed non-compliance with BP medications prior to admission.  He was given Lisinopril and Lasix for BP control during his hospital stay. Of note, amlodipine was recently discontinued by the patient's nephrologist, due to episodes of hypotension in the outpatient setting, so was not given during hospitalization and was removed from the patient's medication list at discharge.  This medication may be re-added in the future if necessary.  3. Anemia - A complete blood count checked this admission shows that the patient's hemoglobin has  dropped slightly from a previous baseline of 12, down to 10, with an MCV of 79.  This likely represents anemia of chronic disease, and mirrors the patient's worsening kidney disease.  We will follow this issue in the outpatient setting.  4. CKD - the patient has a history of CKD, thought to be secondary to a history of poorly-controlled HTN. His creatinine remained stable at his baseline during this stay.  Time spent on discharge: 45 minutes  Discharge Vitals:  BP 176/88  Pulse 72  Temp(Src) 98.3 F (36.8 C) (Oral)  Resp 19  SpO2 100%  Discharge Labs:  Results for orders placed during the hospital encounter of 08/21/12 (from the past 24 hour(s))  COMPREHENSIVE METABOLIC PANEL     Status: Abnormal   Collection Time    08/21/12  7:37 PM      Result Value Range   Sodium 138  135 - 145 mEq/L   Potassium 4.1  3.5 - 5.1 mEq/L   Chloride 104  96 - 112 mEq/L   CO2 25  19 - 32 mEq/L   Glucose, Bld 101 (*) 70 - 99 mg/dL   BUN 34 (*) 6 - 23 mg/dL   Creatinine, Ser 2.44 (*) 0.50 - 1.35 mg/dL   Calcium 9.0  8.4 - 10.5 mg/dL   Total Protein 8.0  6.0 - 8.3 g/dL   Albumin 3.5  3.5 - 5.2 g/dL   AST 56 (*) 0 - 37 U/L   ALT 15  0 - 53 U/L   Alkaline Phosphatase 90  39 - 117 U/L   Total Bilirubin 0.2 (*) 0.3 - 1.2 mg/dL   GFR calc non Af Amer 26 (*) >90 mL/min   GFR calc Af Amer 31 (*) >90 mL/min  CBC WITH DIFFERENTIAL     Status: Abnormal   Collection Time    08/21/12  7:37 PM      Result Value Range   WBC 6.5  4.0 - 10.5 K/uL   RBC 3.95 (*) 4.22 - 5.81 MIL/uL   Hemoglobin 10.1 (*) 13.0 - 17.0 g/dL   HCT 31.2 (*) 39.0 - 52.0 %   MCV 79.0  78.0 - 100.0 fL   MCH 25.6 (*)  26.0 - 34.0 pg   MCHC 32.4  30.0 - 36.0 g/dL   RDW 14.1  11.5 - 15.5 %   Platelets 171  150 - 400 K/uL   Neutrophils Relative 64  43 - 77 %   Neutro Abs 4.2  1.7 - 7.7 K/uL   Lymphocytes Relative 22  12 - 46 %   Lymphs Abs 1.4  0.7 - 4.0 K/uL   Monocytes Relative 8  3 - 12 %   Monocytes Absolute 0.5  0.1 - 1.0 K/uL    Eosinophils Relative 6 (*) 0 - 5 %   Eosinophils Absolute 0.4  0.0 - 0.7 K/uL   Basophils Relative 0  0 - 1 %   Basophils Absolute 0.0  0.0 - 0.1 K/uL  PROTIME-INR     Status: None   Collection Time    08/21/12  7:37 PM      Result Value Range   Prothrombin Time 14.0  11.6 - 15.2 seconds   INR 1.09  0.00 - 1.49  APTT     Status: Abnormal   Collection Time    08/21/12  7:37 PM      Result Value Range   aPTT 42 (*) 24 - 37 seconds  SEDIMENTATION RATE     Status: Abnormal   Collection Time    08/21/12  7:37 PM      Result Value Range   Sed Rate 88 (*) 0 - 16 mm/hr  BASIC METABOLIC PANEL     Status: Abnormal   Collection Time    08/22/12  5:10 AM      Result Value Range   Sodium 141  135 - 145 mEq/L   Potassium 4.0  3.5 - 5.1 mEq/L   Chloride 105  96 - 112 mEq/L   CO2 28  19 - 32 mEq/L   Glucose, Bld 97  70 - 99 mg/dL   BUN 30 (*) 6 - 23 mg/dL   Creatinine, Ser 2.46 (*) 0.50 - 1.35 mg/dL   Calcium 9.2  8.4 - 10.5 mg/dL   GFR calc non Af Amer 26 (*) >90 mL/min   GFR calc Af Amer 30 (*) >90 mL/min  CBC     Status: Abnormal   Collection Time    08/22/12  5:10 AM      Result Value Range   WBC 5.6  4.0 - 10.5 K/uL   RBC 3.88 (*) 4.22 - 5.81 MIL/uL   Hemoglobin 9.9 (*) 13.0 - 17.0 g/dL   HCT 30.6 (*) 39.0 - 52.0 %   MCV 78.9  78.0 - 100.0 fL   MCH 25.5 (*) 26.0 - 34.0 pg   MCHC 32.4  30.0 - 36.0 g/dL   RDW 14.1  11.5 - 15.5 %   Platelets 158  150 - 400 K/uL    Signed: Elnora Morrison 08/22/2012, 2:28 PM

## 2012-08-22 NOTE — Discharge Summary (Signed)
INTERNAL MEDICINE TEACHING SERVICE Attending Note  Date: 08/22/2012  Patient name: Luke Dunn  Medical record number: [id number]  Date of birth: [birth year]    This patient has been seen and discussed with the house staff. Please see their note for complete details. I concur with their findings and plan.   Dominic Pea, DO  08/22/2012, 4:28 PM

## 2012-08-23 ENCOUNTER — Telehealth: Payer: Self-pay | Admitting: *Deleted

## 2012-08-23 NOTE — Telephone Encounter (Signed)
Call from [place] at [facility]. Reporting a positive Blood culture from pt's hospital stay. Labs drawn on 4/2 and one of 4 bottles showed positive cocci in clusters. ( aerobic )  Report called to Dr Elnora Morrison

## 2012-08-23 NOTE — Telephone Encounter (Signed)
Cong. Nurse, carolyn obrien calls and states dr webb had started labetalol 200mg  twice daily for pt last October. i don't see it on med list, her ph# [phone]

## 2012-08-24 LAB — CULTURE, BLOOD (ROUTINE X 2)

## 2012-08-25 ENCOUNTER — Encounter: Payer: Self-pay | Admitting: Internal Medicine

## 2012-08-25 LAB — BODY FLUID CULTURE
Gram Stain: NONE SEEN
Gram Stain: NONE SEEN
Organism ID, Bacteria: NO GROWTH

## 2012-08-25 NOTE — Telephone Encounter (Signed)
I returned Ms. Obrien's call.  I confirmed that the patient should continue labetalol, as prescribed by his nephrologist.  I updated our medication information to reflect this change.

## 2012-08-26 NOTE — Progress Notes (Signed)
I saw and evaluated the patient.  I personally confirmed the key portions of Dr. Saul Fordyce history and exam and reviewed pertinent patient test results.  The assessment, diagnosis, and plan were formulated together and I agree with the documentation in the resident's note.  He will be admitted for further assessment of a possible septic joint.  The wrist was aspirated and the fluid sent for culture and I was present for the entire procedure.

## 2012-08-28 ENCOUNTER — Encounter: Payer: No Typology Code available for payment source | Admitting: Internal Medicine

## 2012-08-28 LAB — CULTURE, BLOOD (ROUTINE X 2)

## 2012-08-30 ENCOUNTER — Ambulatory Visit (INDEPENDENT_AMBULATORY_CARE_PROVIDER_SITE_OTHER): Payer: No Typology Code available for payment source | Admitting: Internal Medicine

## 2012-08-30 ENCOUNTER — Encounter: Payer: Self-pay | Admitting: Internal Medicine

## 2012-08-30 VITALS — BP 145/90 | HR 80 | Temp 98.7°F | Ht 63.5 in | Wt 199.7 lb

## 2012-08-30 DIAGNOSIS — R195 Other fecal abnormalities: Secondary | ICD-10-CM

## 2012-08-30 DIAGNOSIS — M109 Gout, unspecified: Secondary | ICD-10-CM

## 2012-08-30 DIAGNOSIS — I10 Essential (primary) hypertension: Secondary | ICD-10-CM

## 2012-08-30 DIAGNOSIS — D649 Anemia, unspecified: Secondary | ICD-10-CM

## 2012-08-30 DIAGNOSIS — D509 Iron deficiency anemia, unspecified: Secondary | ICD-10-CM | POA: Insufficient documentation

## 2012-08-30 LAB — IRON AND TIBC
%SAT: 35 % (ref 20–55)
TIBC: 311 ug/dL (ref 215–435)

## 2012-08-30 LAB — FERRITIN: Ferritin: 180 ng/mL (ref 22–322)

## 2012-08-30 NOTE — Assessment & Plan Note (Signed)
Pertinent Data:   Ref. Range 10/28/2008 11/19/2008 08/21/2012 08/22/2012  Hemoglobin Latest Range: 13.0-17.0 g/dL 13.3 12.2 (L) 10.1 (L) 9.9 (L)  HCT Latest Range: 39.0-52.0 % 39.0 37.0 (L) 31.2 (L) 30.6 (L)  MCV Latest Range: 78.0-100.0 fL  79.7 79.0 78.9   FOBT positive   Assessment: The patient is noted to have a microcytic anemiathat has been getting progressively worse since 2010. 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 However, I did perform FOBT today which was positive, therefore, there is concern for GI blood loss as well and this must be further evaluated. He has no personal or family history of colon cancer. He reports last colonoscopy possibly in 2002, however, per chart review it seems that the study performed at that time was a barium enema not colonoscopy.  Luckily, the patient is asymptomatic at this time, he also does not have evidence of gross bleeding. However he will require continued evaluation.  Plan:      Will obtain iron, TIBC ( to allow for calculation of transferrin saturation) and ferritin levels today.  Will refer to gastroenterology for colonoscopy.

## 2012-08-30 NOTE — Progress Notes (Signed)
Patient: Luke Dunn   MRN: [id number]  DOB: 02/03/1951  PCP: Hester Mates, MD   Subjective:    HPI: Luke Dunn is a 62 y.o. male with a PMHx as outlined below, who presented to clinic today for the following:  1) Hospital follow-up - Patient was hospitalized at [facility] from April 2 - 3, 2014 for evaluation of the following:  Acute on chronic gout flare - pt was admitted with 4 day history of diffuse right wrist pain and swelling. Joint aspiration fluid culture was negative for organisms. As well, wrist XR was negative for acute fx. Hand surgery consultants felt sx/exam most suggestive of acute gout flare. Therefore, the patient was treated with prednisone taper and Norco.  Since hospital discharge, patient does note significant improvement of symptoms - although not with resolution at this point. Swelling improved significant. is taking recommended medications regularly - still on the prednisone (3 more days).   HTN - during the hospital course, the patient was noted to have persistent hypertension, thought related to acute pain in addition to medication noncompliance with home Lisinopril, Lasix, and Labetalol 200mg  BID. Since discharge home, the patient has been compliant with discharge medications. denies headaches, dizziness, lightheadedness, chest pain, shortness of breath.  does not request refills today.  Acute on chronic normocytic anemia - during hospital course, the patient was noted to have slight decline in his hemoglobin to 10 from prior baseline of 12. This was thought likely secondary to his anemia of chronic disease in the setting of his progressive renal failure. The patient denies blood in stool, blood in urine, headedness, dizziness, shortness of breath, chest pain.   Review of Systems: Per HPI.   Current Outpatient Medications: Medication Sig  . allopurinol (ZYLOPRIM) 100 MG tablet Take 1 tablet (100 mg total) by mouth daily.  Marland Kitchen labetalol (NORMODYNE) 200 MG tablet Take 200 mg  by mouth 2 (two) times daily.  Marland Kitchen lisinopril (PRINIVIL,ZESTRIL) 5 MG tablet Take 1 tablet (5 mg total) by mouth daily.  . predniSONE (DELTASONE) 10 MG tablet Take 3 tablets per day for days 1-4, then 2 tabs/day for days 5-7, then 1 tab/day for days 8-10  . furosemide (LASIX) 20 MG tablet Take 1 tablet (20 mg total) by mouth daily.  . traMADol (ULTRAM) 50 MG tablet Take 50 mg by mouth every 6 (six) hours as needed for pain.    Allergies: No Known Allergies   Past Medical History  Diagnosis Date  . Gout   . CKD (chronic kidney disease) stage 4, GFR 15-29 ml/min     Followed by [facility].  Thought to be due to long-standing HTN  . Essential hypertension      Objective:    Physical Exam: BP 148/87  Pulse 85  Temp(Src) 98.7 F (37.1 C) (Oral)  Ht 5' 3.5" (1.613 m)  Wt 199 lb 11.2 oz (90.583 kg)  BMI 34.82 kg/m2  SpO2 97%    General: Vital signs reviewed and noted. Well-developed, well-nourished, in no acute distress; alert, appropriate and cooperative throughout examination.  Head: Normocephalic, atraumatic.  Lungs:  Normal respiratory effort. Clear to auscultation BL without crackles or wheezes.  Heart: RRR. S1 and S2 normal without gallop, rubs. No murmur.  Abdomen:  BS normoactive. Soft, Nondistended, non-tender.  No masses or organomegaly.  Extremities: No pretibial edema. Right hand/wrist - full range of motion in flexion/extention/ ulnar and radial deviation. No significant warmth, redness, edema appreciated on exam. Full grip strength.  Rectal exam:  Small external hemorrhoid, normal anal tone. Brown hard stool noted in the rectal vault.stool sample was obtained, and was FOBT positive. Exam chaperoned by Luke Nephew, RN   Assessment/ Plan:   The patient's case and plan of care was discussed with attending physician, Dr. Milta Deiters.

## 2012-08-30 NOTE — Assessment & Plan Note (Signed)
Pertinent Data: BP Readings from Last 3 Encounters:  08/30/12 145/90  08/22/12 176/88  08/21/12 [id number]    Basic Metabolic Panel:    Component Value Date/Time   NA 141 08/22/2012 0510   K 4.0 08/22/2012 0510   CL 105 08/22/2012 0510   CO2 28 08/22/2012 0510   BUN 30* 08/22/2012 0510   CREATININE 2.46* 08/22/2012 0510   CREATININE 3.09* 01/18/2012 1630   GLUCOSE 97 08/22/2012 0510   CALCIUM 9.2 08/22/2012 0510    Assessment: Disease Control:  improved  Progress toward goals:  mildly elevated  Barriers to meeting goals: no barriers identified and Prior medication noncompliance.    Today, the patient's blood pressure remains very mildly elevated, however, he is currently on prednisone therapy. I therefore will not escalate his medication today. However, both his Lasix and lisinopril are at low doses at present, and would allow for escalation as needed at his next followup visit.    Patient is compliant all of the time with prescribed medications.   Plan:  continue current medications  Educational resources provided: brochure (has been given information in his language.  )  Self management tools provided: home blood pressure logbook

## 2012-08-30 NOTE — Patient Instructions (Addendum)
General Instructions:  Please follow-up at the clinic in 1 month, at which time we will reevaluate your blood pressure, gout - OR, please follow-up in the clinic sooner if needed.  There have not been changes in your medications.   You are getting labs today, if they are abnormal I will give you a call.   We will call you with information about your appointment with the GI doctors to get a colonoscopy.   If symptoms worsen, or new symptoms arise, please call the clinic or go to the ER.  PLEASE BRING ALL OF YOUR MEDICATIONS  IN A BAG TO YOUR NEXT APPOINTMENT  Progress Toward Treatment Goals:  Treatment Goal 08/21/2012  Blood pressure deteriorated    Self Care Goals & Plans:  Self Care Goal 08/30/2012  Manage my medications take my medicines as prescribed; bring my medications to every visit; refill my medications on time  Eat healthy foods drink diet soda or water instead of juice or soda; eat more vegetables; eat foods that are low in salt

## 2012-08-30 NOTE — Assessment & Plan Note (Addendum)
Pertinent Data: Uric acid (02/2011) - 10.2  Assessment: Had an acute on chronic gout attack involving right wrist. Joint aspiration was negative for organisms. He was treated during the hospital course with prednisone taper, with 3 remaining days. He is doing much better.  Plan:      Continue with prednisone taper 3 additional days  Continue with maintenance allopurinol  Consider uric acid level next visit to assess if at goal of < 6 - may otherwise need escalation of his allopurinol (as is able given his CKD4).

## 2012-08-31 NOTE — Progress Notes (Signed)
Quick Note:  Most consistent with AOCD and not indicative of iron deficiency. ______

## 2012-09-04 NOTE — Progress Notes (Signed)
Discussed with resident.  Agree with note and plans.

## 2012-09-19 ENCOUNTER — Other Ambulatory Visit: Payer: Self-pay | Admitting: Internal Medicine

## 2012-10-02 ENCOUNTER — Encounter: Payer: Self-pay | Admitting: Internal Medicine

## 2012-10-02 ENCOUNTER — Ambulatory Visit (INDEPENDENT_AMBULATORY_CARE_PROVIDER_SITE_OTHER): Payer: No Typology Code available for payment source | Admitting: Internal Medicine

## 2012-10-02 VITALS — BP 160/98 | HR 87 | Temp 98.9°F | Ht 60.0 in | Wt 192.6 lb

## 2012-10-02 DIAGNOSIS — I129 Hypertensive chronic kidney disease with stage 1 through stage 4 chronic kidney disease, or unspecified chronic kidney disease: Secondary | ICD-10-CM

## 2012-10-02 DIAGNOSIS — N184 Chronic kidney disease, stage 4 (severe): Secondary | ICD-10-CM

## 2012-10-02 DIAGNOSIS — I10 Essential (primary) hypertension: Secondary | ICD-10-CM

## 2012-10-02 DIAGNOSIS — M109 Gout, unspecified: Secondary | ICD-10-CM

## 2012-10-02 MED ORDER — DICLOFENAC SODIUM 1 % TD GEL: 2.0000 g | Freq: Three times a day (TID) | TRANSDERMAL | Status: DC | PRN

## 2012-10-02 MED ORDER — PREDNISONE 20 MG PO TABS: ORAL_TABLET | ORAL | Status: DC

## 2012-10-02 NOTE — Assessment & Plan Note (Signed)
The patient's BP is elevated today after being off of his BP meds for 1 week.  We discussed at length the importance of BP control, and how that is the only way to prolong his dialysis-free life.  The patient's congregational nurse takes his BP weekly.   -patient seemed to understand, and agrees to restart BP meds -the patient's congregational nurse agrees to fax his BP's to our office monthly, and we can help adjust the patient's BP medications remotely.  The first step would likely be to increase labetalol.  After that, we can try amlodipine 2.5 (the patient previously tried amlodipine 10, but experienced symptomatic hypotension).

## 2012-10-02 NOTE — Assessment & Plan Note (Signed)
The patient has a history of CKD, currently followed by Dr. Justin Mend.  We greatly appreciate the excellent care he has received there!

## 2012-10-02 NOTE — Assessment & Plan Note (Signed)
The patient was recently hospitalized last month for a gout flare.  He notes that the pain nearly resolved, but has recurred at a lower level of about 5/10, which has persisted.  We discussed the importance of not taking indomethacin given his CKD.  Also we'll avoid colchicine for the same reason. -repeat a course of prednisone - 40 mg tapering over 14 days -prescribed voltaren gel for pain relief -encouraged continued use of stiff wrist splint -encouraged patient to ice wrist for 10 minutes on, 10 minutes off, 3 times per day -if the above doesn't work, we could consider intraarticular steroids at his next visit

## 2012-10-02 NOTE — Progress Notes (Signed)
HPI The patient is a 62 y.o. male with a history of HTN, CKD stage 4, Gout, presenting for a follow-up visit.  The patient has a history of HTN.  BP remains elevated today, at 160/98.  He notes that one week ago, he had a 2-day self-limited episode of nausea and vomiting, and has not taken his BP medications since then.  The patient continues to note R wrist pain, described as a 5/10.  He notes that since his last hospitalization, the pain nearly subsided.  The patient finished the course of prednisone given at hospital discharge.  However, a few weeks ago, the pain recurred, and has been persistent.  He has been using a stiff wrist splint, which helps the pain, but he cannot use this at work because he has to do a lot of heavy lifting, which worsens his pain.  The patient now is taking tramadol for pain, but states it does not help.  He has been taking OTC indomethacin, which has helped the pain.  He notes no fevers, chills.  ROS: General: no fevers, chills, changes in weight, changes in appetite Skin: no rash HEENT: no blurry vision, hearing changes, sore throat Pulm: no dyspnea, coughing, wheezing CV: no chest pain, palpitations, shortness of breath Abd: no abdominal pain, nausea/vomiting, diarrhea/constipation GU: no dysuria, hematuria, polyuria Ext: see HPI Neuro: no weakness, numbness, or tingling  Filed Vitals:   10/02/12 1612  BP: 160/98  Pulse: 87  Temp: 98.9 F (37.2 C)    PEX General: alert, cooperative, and in no apparent distress HEENT: pupils equal round and reactive to light, vision grossly intact, oropharynx clear and non-erythematous  Neck: supple, no lymphadenopathy Lungs: clear to ascultation bilaterally, normal work of respiration, no wheezes, rales, ronchi Heart: regular rate and rhythm, no murmurs, gallops, or rubs Abdomen: soft, non-tender, non-distended, normal bowel sounds Extremities: right wrist warm, diffusely ttp, though without erythema. Full ROM, though  limited by pain Neurologic: alert & oriented X3, cranial nerves II-XII intact, strength grossly intact though limited by pain, sensation intact to light touch  Current Outpatient Prescriptions on File Prior to Visit  Medication Sig Dispense Refill  . allopurinol (ZYLOPRIM) 100 MG tablet Take 1 tablet (100 mg total) by mouth daily.  30 tablet  11  . furosemide (LASIX) 20 MG tablet Take 1 tablet (20 mg total) by mouth daily.  90 tablet  3  . labetalol (NORMODYNE) 200 MG tablet Take 200 mg by mouth 2 (two) times daily.      Marland Kitchen lisinopril (PRINIVIL,ZESTRIL) 5 MG tablet Take 1 tablet (5 mg total) by mouth daily.  90 tablet  3  . predniSONE (DELTASONE) 10 MG tablet Take 3 tablets per day for days 1-4, then 2 tabs/day for days 5-7, then 1 tab/day for days 8-10  21 tablet  0  . traMADol (ULTRAM) 50 MG tablet TAKE 1 OR 2 TABLETS (50-100 MG TOTAL) BY MOUTH EVERY 6 (SIX) HOURS AS NEEDED FOR PAIN.  60 tablet  3   No current facility-administered medications on file prior to visit.    Assessment/Plan

## 2012-10-02 NOTE — Patient Instructions (Signed)
General Instructions: Your wrist pain is due to Gout.  To treat this: -Take Prednisone, 20 mg tablets.  Take 2 tablets per day for 7 days, then 1 tablet per day for 7 days, then stop. -you may also use Voltaren gel, apply to the wrist 3 times per day as needed for pain  Your blood pressure is elevated today.  Hoyle Sauer will check your blood pressure at home, and we will help you adjust your medication dosage.  Please return for a follow-up visit in 6 months, or sooner if your pain does not improve.   Treatment Goals:  Goals (1 Years of Data) as of 10/02/12   None      Progress Toward Treatment Goals:  Treatment Goal 10/02/2012  Blood pressure deteriorated    Self Care Goals & Plans:  Self Care Goal 10/02/2012  Manage my medications take my medicines as prescribed; refill my medications on time  Monitor my health bring my blood pressure log to each visit; keep track of my blood pressure  Eat healthy foods eat foods that are low in salt; eat baked foods instead of fried foods; drink diet soda or water instead of juice or soda; eat more vegetables  Be physically active take a walk every day       Care Management & Community Referrals:  Referral 10/02/2012  Referrals made for care management support none needed

## 2012-10-08 NOTE — Progress Notes (Signed)
Case discussed with Dr. Brown (at time of visit, soon after the resident saw the patient).  We reviewed the resident's history and exam and pertinent patient test results.  I agree with the assessment, diagnosis, and plan of care documented in the resident's note. 

## 2012-10-11 ENCOUNTER — Telehealth: Payer: Self-pay | Admitting: Internal Medicine

## 2012-10-11 NOTE — Telephone Encounter (Signed)
  INTERNAL MEDICINE RESIDENCY PROGRAM After-Hours Telephone Call    Reason for call:  I received a call from Ms. Luke Dunn @ 6:08pm Insurance account manager) on behalf of Luke Dunn. She visits him weekly to check blood pressure.  She is calling because last Friday he was complaining of pain on the left arch of his foot.  He has been using prednisone, tramadol and Voltaren gel, and tried ice today.  Today he is in 10/10 pain (just today) on the top of his foot appearing like a knot (as large as a 50 cent piece), did not go to work today (went yesterday), but did not want to go to the doctor because of cost.  No known fevers or chills, but patient does not have a thermometer.  Knot felt warm but without significant redness.     Pertinent Data:   Patient was last seen by Dr. Owens Shark on 10/02/12 and was thought to have a gout flare in his R wrist, and was being treated with a 14d prednisone taper.      Assessment / Plan / Recommendations:   While this could be another gout flare, he is on prednisone at present still.  He cannot use NSAIDs given his CKD.  I am hesitant to prescribe increased steroids without seeing his foot for concern of infection, though he does not sound ill.  This is also a difficult situation since the RN called when she was already away from him, so she cannot answer all of my questions (for example, she is not 100% sure if he has had chills or not).  For now, I have suggested that he may continue taking tramadol, though not to increase frequency given his renal insufficiency, and to try ice.  If symptoms worsen or persist prior to Tuesday 10/15/12, he will need to be seen at urgent care, otherwise they should call the clinic to try to make an appointment.   As always, pt is advised that if symptoms worsen or new symptoms arise, they should go to an urgent care facility or to to ER for further evaluation.    Othella Boyer, MD   10/11/2012, 6:32 PM

## 2013-05-29 ENCOUNTER — Encounter: Payer: Self-pay | Admitting: Internal Medicine

## 2013-05-29 ENCOUNTER — Ambulatory Visit (INDEPENDENT_AMBULATORY_CARE_PROVIDER_SITE_OTHER): Payer: Medicare Other | Admitting: Internal Medicine

## 2013-05-29 VITALS — BP 166/97 | HR 87 | Temp 98.5°F | Wt 197.4 lb

## 2013-05-29 DIAGNOSIS — I10 Essential (primary) hypertension: Secondary | ICD-10-CM

## 2013-05-29 LAB — RENAL FUNCTION PANEL
ALBUMIN: 3.4 g/dL — AB (ref 3.5–5.2)
BUN: 45 mg/dL — AB (ref 6–23)
CALCIUM: 8.6 mg/dL (ref 8.4–10.5)
CO2: 28 mEq/L (ref 19–32)
Chloride: 103 mEq/L (ref 96–112)
Creat: 3.11 mg/dL — ABNORMAL HIGH (ref 0.50–1.35)
GLUCOSE: 153 mg/dL — AB (ref 70–99)
Phosphorus: 3.1 mg/dL (ref 2.3–4.6)
Potassium: 5.2 mEq/L (ref 3.5–5.3)
Sodium: 141 mEq/L (ref 135–145)

## 2013-05-29 MED ORDER — LABETALOL HCL 200 MG PO TABS: 200.0000 mg | ORAL_TABLET | Freq: Two times a day (BID) | ORAL | Status: DC

## 2013-05-29 MED ORDER — FUROSEMIDE 20 MG PO TABS: 20.0000 mg | ORAL_TABLET | Freq: Every day | ORAL | Status: DC

## 2013-05-29 MED ORDER — LISINOPRIL 10 MG PO TABS: 10.0000 mg | ORAL_TABLET | Freq: Every day | ORAL | Status: DC

## 2013-05-29 NOTE — Patient Instructions (Signed)
Please start taking  -Labetalol 200mg  (1 tablet) twice daily -Lisinopril 10mg  daily (1 tablet) -Lasix 20mg  daily (1 tablet)

## 2013-05-29 NOTE — Assessment & Plan Note (Signed)
BP Readings from Last 3 Encounters:  05/29/13 166/97  10/02/12 160/98  08/30/12 145/90    Lab Results  Component Value Date   NA 141 05/29/2013   K 5.2 05/29/2013   CREATININE 3.11* 05/29/2013    Assessment: Blood pressure control:  elevated Progress toward BP goal:   unchanged Comments: medication noncompliance  Plan: Medications:  educated patient on importance of taking medications, he seems agreeable today.  Stat labs did not suggest hyperkalemia or acidosis, mild uremia may be contributing to pruritis; asked patient to continue lisinopril 10 and restart lasix 20 and labetalol 200 bid; RTC in 1 week for labs and BP check Congregational RN present during visit - asset to patient!

## 2013-05-29 NOTE — Progress Notes (Signed)
Subjective:   Patient ID: Luke Dunn male   DOB: [birth year] 63 y.o.   MRN: [id number]  Chief Complaint  Patient presents with  . Medication Management    pt not taking meds correctly-only taking lisinopril at times   HPI: Luke Dunn is a 63 y.o. man with history of HTN, CKD, anemia and gout who presents for medication mgmt. He was last seen in clinic on 10/02/12 for gout & HTN.  Here for medication refills. He is accompanied by his congregational nurse ([id number]) who has been helping coordinate his care. Also, translator is present.   He was supposed to see Dr. Justin Mend in Oct, but appt was cancelled because patient did not want to go (due to cost) - last appointment was likely Jul 18, 2012   Has a bottle of vietnamese medication  He has the following bottles with him: allopurinol 100mg  daily (not taking), Labetalol 200 bid (not taking), Lisinopril 10mg  daily (taking this), and lasix 20mg  daily (not taking)   Review of Systems: Constitutional: Denies fever, chills, diaphoresis, appetite change and fatigue.  HEENT: Denies photophobia, eye pain, redness, hearing loss, ear pain, congestion, sore throat, rhinorrhea, sneezing, mouth sores, trouble swallowing, neck pain, neck stiffness and tinnitus.  Respiratory: Denies SOB, DOE, chest tightness, and wheezing. Sputum producing cough x chronic, occasional -uncomfortable feeling in throat at night Cardiovascular: Denies chest pain, palpitations and leg swelling.  Gastrointestinal: Denies nausea, vomiting, abdominal pain, diarrhea, constipation,blood in stool and abdominal distention.  Genitourinary: Denies dysuria, urgency, frequency, hematuria, flank pain and difficulty urinating.  Musculoskeletal:  B/l knee pain but relief with topical oil (Trankal - has dexamethasone, indomethacin and B vitamins) Skin: complaints of itchy skin  Neurological: Denies dizziness, seizures, syncope, weakness, lightheadedness, numbness and headaches.    Past Medical  History  Diagnosis Date  . Gout   . CKD (chronic kidney disease) stage 4, GFR 15-29 ml/min     Followed by [facility].  Thought to be due to long-standing HTN  . Essential hypertension    Current Outpatient Prescriptions  Medication Sig Dispense Refill  . diclofenac sodium (VOLTAREN) 1 % GEL Apply 2 g topically 3 (three) times daily as needed (right wrist pain). Apply to right wrist.  100 g  1  . furosemide (LASIX) 20 MG tablet Take 1 tablet (20 mg total) by mouth daily.  90 tablet  3  . labetalol (NORMODYNE) 200 MG tablet Take 200 mg by mouth 2 (two) times daily.      Marland Kitchen lisinopril (PRINIVIL,ZESTRIL) 5 MG tablet Take 1 tablet (5 mg total) by mouth daily.  90 tablet  3  . predniSONE (DELTASONE) 20 MG tablet Take 2 tabs per day for 7 days, then 1 tab per day for 7 days, then stop.  21 tablet  0  . traMADol (ULTRAM) 50 MG tablet TAKE 1 OR 2 TABLETS (50-100 MG TOTAL) BY MOUTH EVERY 6 (SIX) HOURS AS NEEDED FOR PAIN.  60 tablet  3   No current facility-administered medications for this visit.   No family history on file. History   Social History  . Marital Status: Single    Spouse Name: N/A    Number of Children: N/A  . Years of Education: N/A   Social History Main Topics  . Smoking status: Former Smoker -- 1.00 packs/day for 37 years    Quit date: 02/01/2007  . Smokeless tobacco: Former Systems developer    Quit date: 02/01/2007  . Alcohol Use: No  . Drug Use: No  .  Sexual Activity: None   Other Topics Concern  . None   Social History Narrative   Vietnameese speaking only, low educational level, works in Architect.  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       Financial assistance approved for 70% discount at [facility] and has [facility] card.     Objective:  Physical Exam: Filed Vitals:   05/29/13 1423  BP: 166/97  Pulse: 87  Temp: 98.5 F (36.9 C)  TempSrc: Oral  Weight: 197 lb 6.4 oz (89.54 kg)  SpO2: 99%   General: alert, cooperative, and in no apparent distress HEENT: pupils equal round  and reactive to light, vision grossly intact, oropharynx clear and non-erythematous  Lungs: clear to ascultation bilaterally, normal work of respiration, no wheezes, rales, ronchi Heart: regular rate and rhythm, no murmurs, gallops, or rubs Abdomen: soft, non-tender, non-distended, normal bowel sounds, obese Extremities: trace pitting pedal edema  Assessment & Plan:  Case and care discussed with Dr. Marinda Elk.  Please see problem oriented charting for further details. Patient to return in 1 week for BP check and renal fxn panel.

## 2013-05-30 NOTE — Progress Notes (Signed)
Case discussed with Dr. Burnard Bunting soon after the resident saw the patient.  We reviewed the resident's history and exam and pertinent patient test results.  I agree with the assessment, diagnosis, and plan of care documented in the resident's note, with the following additional comments.  Lab results indicate hyperglycemia; would add HgbA1C to the blood work, and also refer for nutritional counseling.

## 2013-06-05 ENCOUNTER — Ambulatory Visit (INDEPENDENT_AMBULATORY_CARE_PROVIDER_SITE_OTHER): Payer: Medicare Other | Admitting: Internal Medicine

## 2013-06-05 ENCOUNTER — Encounter: Payer: Self-pay | Admitting: Internal Medicine

## 2013-06-05 VITALS — BP 104/69 | HR 79 | Temp 97.0°F | Wt 195.0 lb

## 2013-06-05 DIAGNOSIS — R7309 Other abnormal glucose: Secondary | ICD-10-CM

## 2013-06-05 DIAGNOSIS — I10 Essential (primary) hypertension: Secondary | ICD-10-CM | POA: Diagnosis not present

## 2013-06-05 DIAGNOSIS — N184 Chronic kidney disease, stage 4 (severe): Secondary | ICD-10-CM | POA: Diagnosis not present

## 2013-06-05 DIAGNOSIS — R739 Hyperglycemia, unspecified: Secondary | ICD-10-CM

## 2013-06-05 LAB — BASIC METABOLIC PANEL
BUN: 77 mg/dL — AB (ref 6–23)
CALCIUM: 8.8 mg/dL (ref 8.4–10.5)
CO2: 27 mEq/L (ref 19–32)
CREATININE: 3.35 mg/dL — AB (ref 0.50–1.35)
Chloride: 104 mEq/L (ref 96–112)
Glucose, Bld: 137 mg/dL — ABNORMAL HIGH (ref 70–99)
Potassium: 5.2 mEq/L (ref 3.5–5.3)
Sodium: 141 mEq/L (ref 135–145)

## 2013-06-05 LAB — GLUCOSE, CAPILLARY: GLUCOSE-CAPILLARY: 140 mg/dL — AB (ref 70–99)

## 2013-06-05 LAB — POCT GLYCOSYLATED HEMOGLOBIN (HGB A1C): HEMOGLOBIN A1C: 5.7

## 2013-06-05 NOTE — Patient Instructions (Addendum)
General Instructions:  -YOU'RE DOING GREAT!  -Continue: -Labetalol 200mg  (1 tablet) twice daily  -Lisinopril 10mg  daily (1 tablet)  -Lasix 20mg  daily (1 tablet)  Please be sure to bring all of your medications with you to every visit.  Should you have any new or worsening symptoms, please be sure to call the clinic at [phone].   Treatment Goals:  Goals (1 Years of Data) as of 06/05/13         As of Today 05/29/13 10/02/12 08/30/12 08/30/12     Blood Pressure    . Blood Pressure < 140/90  104/69 166/97 160/98 145/90 148/87      Progress Toward Treatment Goals:  Treatment Goal 06/05/2013  Blood pressure at goal    Self Care Goals & Plans:  Self Care Goal 06/05/2013  Manage my medications take my medicines as prescribed; bring my medications to every visit  Monitor my health -  Eat healthy foods -  Be physically active -    Care Management & Community Referrals:  Referral 10/02/2012  Referrals made for care management support none needed

## 2013-06-05 NOTE — Progress Notes (Signed)
Subjective:   Patient ID: Luke Dunn male   DOB: [birth year] 63 y.o.   MRN: [id number]  HPI: Luke Dunn is a 63 y.o. man with history of HTN, CKD, anemia and gout who presents for BP recheck - seen last week on 05/29/13 with elevated BP and medication noncompliance.  No sx of hypotension since starting all meds such as dizziness/lightheadedness.  After long discussion last week, patient has been compliant with meds, only missing 2 PM doses of labetalol.    Review of Systems:  Constitutional: Denies fever, chills, diaphoresis, appetite change and fatigue.  HEENT: Denies photophobia, eye pain, redness, hearing loss, ear pain, congestion, sore throat, rhinorrhea, sneezing, mouth sores, trouble swallowing, neck pain, neck stiffness and tinnitus.  Respiratory: Denies SOB, DOE, chest tightness, and wheezing.  Cardiovascular: Denies chest pain, palpitations and leg swelling.  Gastrointestinal: Denies nausea, vomiting, abdominal pain, diarrhea, constipation,blood in stool and abdominal distention.  Genitourinary: Denies dysuria, urgency, frequency, hematuria, flank pain and difficulty urinating.  Neurological: Denies dizziness, seizures, syncope, weakness, lightheadedness, numbness and headaches.    Past Medical History  Diagnosis Date  . Gout   . CKD (chronic kidney disease) stage 4, GFR 15-29 ml/min     Followed by [facility].  Thought to be due to long-standing HTN  . Essential hypertension   . Gout 05/04/2009    Qualifier: Diagnosis of  By: Dyann Kief MD, Clifton James     Current Outpatient Prescriptions  Medication Sig Dispense Refill  . diclofenac sodium (VOLTAREN) 1 % GEL Apply 2 g topically 3 (three) times daily as needed (right wrist pain). Apply to right wrist.  100 g  1  . furosemide (LASIX) 20 MG tablet Take 1 tablet (20 mg total) by mouth daily.  90 tablet  3  . labetalol (NORMODYNE) 200 MG tablet Take 1 tablet (200 mg total) by mouth 2 (two) times daily.  60 tablet  5  . lisinopril  (PRINIVIL,ZESTRIL) 10 MG tablet Take 1 tablet (10 mg total) by mouth daily.  30 tablet  11  . traMADol (ULTRAM) 50 MG tablet TAKE 1 OR 2 TABLETS (50-100 MG TOTAL) BY MOUTH EVERY 6 (SIX) HOURS AS NEEDED FOR PAIN.  60 tablet  3   No current facility-administered medications for this visit.   No family history on file. History   Social History  . Marital Status: Single    Spouse Name: N/A    Number of Children: N/A  . Years of Education: N/A   Social History Main Topics  . Smoking status: Former Smoker -- 1.00 packs/day for 37 years    Quit date: 02/01/2007  . Smokeless tobacco: Former Systems developer    Quit date: 02/01/2007  . Alcohol Use: No  . Drug Use: No  . Sexual Activity: None   Other Topics Concern  . None   Social History Narrative   Viet[place]ese speaking only, low educational level, works in Architect.  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       Financial assistance approved for 70% discount at [facility] and has [facility] card.     Objective:  Physical Exam: Filed Vitals:   06/05/13 1430  BP: 104/69  Pulse: 79  Temp: 97 F (36.1 C)  TempSrc: Oral  Weight: 195 lb (88.451 kg)  SpO2: 97%   General: alert, cooperative, and in no apparent distress HEENT: pupils equal round and reactive to light, vision grossly intact, oropharynx clear and non-erythematous  Lungs: clear to ascultation bilaterally, normal work of respiration, no wheezes, rales,  ronchi Heart: regular rate and rhythm, no murmurs, gallops, or rubs Abdomen: soft, non-tender, non-distended, normal bowel sounds, obese  Extremities: trace pitting pedal edema  Assessment & Plan:  Case and care discussed with Dr. Marinda Elk.  Please see problem oriented charting for further details. Patient to return in 3 months for HTN.

## 2013-06-05 NOTE — Assessment & Plan Note (Addendum)
BP Readings from Last 3 Encounters:  06/05/13 104/69  05/29/13 166/97  10/02/12 160/98    Lab Results  Component Value Date   NA 141 05/29/2013   K 5.2 05/29/2013   CREATININE 3.11* 05/29/2013    Assessment: Blood pressure control: controlled Progress toward BP goal:  at goal Comments: Taking all meds- only missed 2 evening doses of labetalol!!!!  Plan: Medications:  continue current medications - lisinopril 10, labetalol 200 bid, furosemide 20 Other plans: return in 3 months Hyperglycemia on last week's labs - order A1c today  ADDENDUM (06/13/13): Cr rose but <30% after restarting all BP meds, recheck at next visit.

## 2013-06-06 NOTE — Progress Notes (Signed)
Case discussed with Dr. Sharda soon after the resident saw the patient.  We reviewed the resident's history and exam and pertinent patient test results.  I agree with the assessment, diagnosis, and plan of care documented in the resident's note. 

## 2013-08-15 ENCOUNTER — Other Ambulatory Visit: Payer: Self-pay | Admitting: *Deleted

## 2013-08-15 MED ORDER — PREDNISONE 20 MG PO TABS: ORAL_TABLET | ORAL | Status: DC

## 2013-08-15 MED ORDER — TRAMADOL HCL 50 MG PO TABS: 50.0000 mg | ORAL_TABLET | Freq: Two times a day (BID) | ORAL | Status: DC | PRN

## 2013-08-15 NOTE — Telephone Encounter (Signed)
I called the patient's congregational nurse.  She confirms that he has been having pain in his bilateral knees for at least the last 1 week.  The patient has a history of gout, with frequent flares.  She notes that he has not been compliant with outpatient allopurinol.  At this point, we will treat this gout flare with a prednisone taper (60 mg x3 days, 40 mg x2 days, 20 mg x2 day).  His congregational nurse will pick up these meds, and communicate to the patient that if symptoms worsen, he is to seek immediate medical care.  We will discuss whether this patient should restart allopurinol at his next visit.  SignedElnora Morrison, PGY3 08/15/2013, 1:45 PM

## 2013-09-10 ENCOUNTER — Encounter: Payer: Self-pay | Admitting: Internal Medicine

## 2013-09-10 ENCOUNTER — Telehealth: Payer: Self-pay | Admitting: *Deleted

## 2013-09-10 ENCOUNTER — Encounter: Payer: Medicare Other | Admitting: Internal Medicine

## 2013-09-10 NOTE — Telephone Encounter (Signed)
Call from congregation nurse in home now - [phone]  Nurse reports swelling to both legs, rt ankle swollen, left foot swelling on top.  Legs are tight, no pitting edema.  Pt is not able to walk with this swelling.  Onset today when he got up.  Denies SOB They had to cancel today's appointment,

## 2013-09-10 NOTE — Telephone Encounter (Signed)
I returned the patient's phone call, and spoke with Luke Dunn.  She notes bilateral LE edema, to the level of the knees, impairing the patient's ability to walk.  The patient notes that the swelling just started today.  He notes no pain in his knees or ankles.  Ms. [name] believes the patient has been compliant with lasix.  The patient has a history of bilateral LE edema, for which he is treated with lasix.  This is thought to be secondary to CKD vs chronic venous insufficiency. -Ms. [name] is to check and see if the pt has been compliant with lasix -pt is to take an additional 20 mg of lasix tomorrow -pt is to try wrapping legs with ACE bandage vs compression stockings to reduce swelling -if symptoms do not improve, pt advised to seek immediate medical care -pt has an appointment in 1.5 weeks for follow-up.  We will check BMET at that time.  Congregational nurse expressed understanding, and agreed to relay message to patient tomorrow.

## 2013-09-19 ENCOUNTER — Encounter: Payer: Self-pay | Admitting: Internal Medicine

## 2013-09-19 ENCOUNTER — Ambulatory Visit (INDEPENDENT_AMBULATORY_CARE_PROVIDER_SITE_OTHER): Payer: Medicare Other | Admitting: Internal Medicine

## 2013-09-19 VITALS — BP 123/77 | HR 86 | Temp 97.4°F | Ht 63.5 in | Wt 187.6 lb

## 2013-09-19 DIAGNOSIS — I10 Essential (primary) hypertension: Secondary | ICD-10-CM | POA: Diagnosis not present

## 2013-09-19 DIAGNOSIS — N184 Chronic kidney disease, stage 4 (severe): Secondary | ICD-10-CM

## 2013-09-19 DIAGNOSIS — M1A00X0 Idiopathic chronic gout, unspecified site, without tophus (tophi): Secondary | ICD-10-CM | POA: Diagnosis not present

## 2013-09-19 DIAGNOSIS — M1A9XX0 Chronic gout, unspecified, without tophus (tophi): Secondary | ICD-10-CM

## 2013-09-19 LAB — BASIC METABOLIC PANEL
BUN: 28 mg/dL — ABNORMAL HIGH (ref 6–23)
CALCIUM: 9.3 mg/dL (ref 8.4–10.5)
CO2: 30 mEq/L (ref 19–32)
CREATININE: 2.76 mg/dL — AB (ref 0.50–1.35)
Chloride: 102 mEq/L (ref 96–112)
Glucose, Bld: 100 mg/dL — ABNORMAL HIGH (ref 70–99)
Potassium: 4.4 mEq/L (ref 3.5–5.3)
Sodium: 140 mEq/L (ref 135–145)

## 2013-09-19 LAB — URIC ACID: URIC ACID, SERUM: 11.8 mg/dL — AB (ref 4.0–7.8)

## 2013-09-19 MED ORDER — COLCHICINE 0.6 MG PO TABS: 0.3000 mg | ORAL_TABLET | Freq: Every day | ORAL | Status: DC

## 2013-09-19 MED ORDER — FUROSEMIDE 40 MG PO TABS: 40.0000 mg | ORAL_TABLET | Freq: Every day | ORAL | Status: DC

## 2013-09-19 MED ORDER — ALLOPURINOL 100 MG PO TABS: 100.0000 mg | ORAL_TABLET | Freq: Every day | ORAL | Status: DC

## 2013-09-19 NOTE — Assessment & Plan Note (Signed)
The main focus of today's visit was gout. The patient's gout flares are becoming more frequent, and interfere with his ability to work.  The majority of today's visit was spent in patient education about gout of the disease process. We came up with the following plan. -Start allopurinol 100 mg daily today -Start Colchicine, 0.3 mg daily, for gout flare prophylaxis while starting allopurinol (renally adjusted for GFR < 30), continue for at least 3 months -Patient is to followup in 3 months. At that time we will check uric acid. If the uric acid is greater than 6, we will increase allopurinol to 150 mg daily, and repeat Uric acid 6 months later.  We will continue to repeat this process every 3 months until uric acid < 6.  I recommend continuing colchicine for 2-3 months every time his allopurinol dose is increased.

## 2013-09-19 NOTE — Patient Instructions (Signed)
General Instructions: For your Gout: -start taking Allopurinol, 100 mg, 1 tablet once per day.  This medication will, over time, get the extra uric acid out of your body -for the next 3 months, take Colchicine, 0.3 mg, 1 tablet once per day.  This will help prevent the Allopurinol from causing a gout flare.  I'm not sure how much this medication will cost, but let me know if it's too expensive.  Please return for a follow-up visit in 3 months.   Treatment Goals:  Goals (1 Years of Data) as of 09/19/13         As of Today 06/05/13 05/29/13 10/02/12 08/30/12     Blood Pressure    . Blood Pressure < 140/90  123/77 104/69 166/97 160/98 145/90      Progress Toward Treatment Goals:  Treatment Goal 06/05/2013  Blood pressure at goal    Self Care Goals & Plans:  Self Care Goal 06/05/2013  Manage my medications take my medicines as prescribed; bring my medications to every visit  Monitor my health -  Eat healthy foods -  Be physically active -    No flowsheet data found.   Care Management & Community Referrals:  Referral 10/02/2012  Referrals made for care management support none needed

## 2013-09-19 NOTE — Assessment & Plan Note (Addendum)
BP Readings from Last 3 Encounters:  09/19/13 123/77  06/05/13 104/69  05/29/13 166/97    Lab Results  Component Value Date   NA 141 06/05/2013   K 5.2 06/05/2013   CREATININE 3.35* 06/05/2013    Assessment: Blood pressure control: controlled Progress toward BP goal:  at goal Comments: BP well controlled on lisinopril and labetalol.  We have increased Lasix to 40 mg daily.  We will check BMET today to ensure this hasn't increased Cr.  Plan: Medications:  Continue lisinopril 10, labetalol 200 mg BID, and we have increased Lasix to 40 mg daily Educational resources provided:   Self management tools provided:   Other plans: Continue strict BP control

## 2013-09-19 NOTE — Progress Notes (Signed)
HPI The patient is a 63 y.o. male with a history of HTN, gout, CKD, presenting for a routine follow-up visit for HTN.  The patient has a history of Gout.  Last uric acid = 10.2, in 2012.  The patient has stopped allopurinol at a prior visit, due to cost.  The patient notes bilateral foot pain and swelling, which comes and goes, appears worse after walking.  He notes that the last time the pain was very severe was about 1.5 weeks ago, and lasted for about 5 days, treated with voltaren gel.  He has had to quit work due to his frequent gout flares.  The patient has a history of HTN.  BP today is well-controlled.  The patient has a history of CKD, and follows at [facility].  His Lasix was increased over the phone 1.5 weeks ago due to bilateral LE edema from 20 mg daily to 40 mg daily, which he has been taking since that time.  He notes improvement in LE edema.  ROS: General: no fevers, chills, changes in weight, changes in appetite Skin: no rash HEENT: no blurry vision, hearing changes, sore throat Pulm: no dyspnea, coughing, wheezing CV: no chest pain, palpitations, shortness of breath Abd: no abdominal pain, nausea/vomiting, diarrhea/constipation GU: no dysuria, hematuria, polyuria Ext: see HPI Neuro: no weakness, numbness, or tingling  Filed Vitals:   09/19/13 1552  BP: 123/77  Pulse: 86  Temp: 97.4 F (36.3 C)    PEX General: alert, cooperative, and in no apparent distress HEENT: pupils equal round and reactive to light, vision grossly intact, oropharynx clear and non-erythematous  Neck: supple Lungs: clear to ascultation bilaterally, normal work of respiration, no wheezes, rales, ronchi Heart: regular rate and rhythm, no murmurs, gallops, or rubs Abdomen: soft, non-tender, non-distended, normal bowel sounds Extremities: trace bilateral non-pitting edema.  Bilateral ankles mildly swollen, but not very tender Neurologic: alert & oriented X3, cranial nerves II-XII intact,  strength grossly intact, sensation intact to light touch  Current Outpatient Prescriptions on File Prior to Visit  Medication Sig Dispense Refill  . diclofenac sodium (VOLTAREN) 1 % GEL Apply 2 g topically 3 (three) times daily as needed (right wrist pain). Apply to right wrist.  100 g  1  . furosemide (LASIX) 20 MG tablet Take 1 tablet (20 mg total) by mouth daily.  90 tablet  3  . labetalol (NORMODYNE) 200 MG tablet Take 1 tablet (200 mg total) by mouth 2 (two) times daily.  60 tablet  5  . lisinopril (PRINIVIL,ZESTRIL) 10 MG tablet Take 1 tablet (10 mg total) by mouth daily.  30 tablet  11  . predniSONE (DELTASONE) 20 MG tablet Take 3 tablets by mouth once per day for 3 days, then 2 tabs/day for 2 days, then 1 tab/day for 2 days,  15 tablet  0  . traMADol (ULTRAM) 50 MG tablet Take 1 tablet (50 mg total) by mouth 2 (two) times daily as needed for moderate pain.  60 tablet  5   No current facility-administered medications on file prior to visit.    Assessment/Plan

## 2013-10-10 NOTE — Progress Notes (Signed)
Case discussed with Dr. Brown at the time of the visit.  We reviewed the resident's history and exam and pertinent patient test results.  I agree with the assessment, diagnosis, and plan of care documented in the resident's note. 

## 2013-11-26 ENCOUNTER — Other Ambulatory Visit: Payer: Self-pay | Admitting: Internal Medicine

## 2013-12-31 ENCOUNTER — Other Ambulatory Visit: Payer: Self-pay | Admitting: *Deleted

## 2014-01-01 MED ORDER — ALLOPURINOL 100 MG PO TABS: 100.0000 mg | ORAL_TABLET | Freq: Every day | ORAL | Status: DC

## 2014-01-13 ENCOUNTER — Ambulatory Visit (HOSPITAL_COMMUNITY)
Admission: RE | Admit: 2014-01-13 | Discharge: 2014-01-13 | Disposition: A | Discharge status: Home / Self Care | Payer: Medicare Other | Source: Ambulatory Visit | Attending: Internal Medicine | Admitting: Internal Medicine

## 2014-01-13 ENCOUNTER — Encounter: Payer: Self-pay | Admitting: Internal Medicine

## 2014-01-13 ENCOUNTER — Ambulatory Visit (INDEPENDENT_AMBULATORY_CARE_PROVIDER_SITE_OTHER): Payer: Medicare Other | Admitting: Internal Medicine

## 2014-01-13 VITALS — BP 120/60 | HR 86 | Temp 98.4°F | Ht 63.5 in | Wt 182.6 lb

## 2014-01-13 DIAGNOSIS — K429 Umbilical hernia without obstruction or gangrene: Secondary | ICD-10-CM | POA: Diagnosis present

## 2014-01-13 DIAGNOSIS — D126 Benign neoplasm of colon, unspecified: Secondary | ICD-10-CM | POA: Diagnosis present

## 2014-01-13 DIAGNOSIS — R634 Abnormal weight loss: Secondary | ICD-10-CM

## 2014-01-13 DIAGNOSIS — R630 Anorexia: Secondary | ICD-10-CM

## 2014-01-13 DIAGNOSIS — N184 Chronic kidney disease, stage 4 (severe): Secondary | ICD-10-CM | POA: Diagnosis not present

## 2014-01-13 DIAGNOSIS — M25569 Pain in unspecified knee: Secondary | ICD-10-CM | POA: Diagnosis present

## 2014-01-13 DIAGNOSIS — R1110 Vomiting, unspecified: Secondary | ICD-10-CM | POA: Diagnosis not present

## 2014-01-13 DIAGNOSIS — Z6830 Body mass index (BMI) 30.0-30.9, adult: Secondary | ICD-10-CM | POA: Diagnosis not present

## 2014-01-13 DIAGNOSIS — N2589 Other disorders resulting from impaired renal tubular function: Secondary | ICD-10-CM | POA: Diagnosis present

## 2014-01-13 DIAGNOSIS — N179 Acute kidney failure, unspecified: Secondary | ICD-10-CM

## 2014-01-13 DIAGNOSIS — K2990 Gastroduodenitis, unspecified, without bleeding: Secondary | ICD-10-CM | POA: Diagnosis not present

## 2014-01-13 DIAGNOSIS — E875 Hyperkalemia: Secondary | ICD-10-CM

## 2014-01-13 DIAGNOSIS — N281 Cyst of kidney, acquired: Secondary | ICD-10-CM | POA: Diagnosis not present

## 2014-01-13 DIAGNOSIS — N189 Chronic kidney disease, unspecified: Secondary | ICD-10-CM | POA: Diagnosis not present

## 2014-01-13 DIAGNOSIS — R1310 Dysphagia, unspecified: Secondary | ICD-10-CM | POA: Diagnosis present

## 2014-01-13 DIAGNOSIS — Z87891 Personal history of nicotine dependence: Secondary | ICD-10-CM

## 2014-01-13 DIAGNOSIS — R1084 Generalized abdominal pain: Secondary | ICD-10-CM | POA: Diagnosis not present

## 2014-01-13 DIAGNOSIS — I129 Hypertensive chronic kidney disease with stage 1 through stage 4 chronic kidney disease, or unspecified chronic kidney disease: Secondary | ICD-10-CM | POA: Diagnosis present

## 2014-01-13 DIAGNOSIS — R110 Nausea: Secondary | ICD-10-CM | POA: Diagnosis not present

## 2014-01-13 DIAGNOSIS — K5730 Diverticulosis of large intestine without perforation or abscess without bleeding: Secondary | ICD-10-CM | POA: Diagnosis present

## 2014-01-13 DIAGNOSIS — K5900 Constipation, unspecified: Secondary | ICD-10-CM | POA: Diagnosis not present

## 2014-01-13 DIAGNOSIS — M1A0790 Idiopathic chronic gout, unspecified ankle and foot, without tophus (tophi): Secondary | ICD-10-CM

## 2014-01-13 DIAGNOSIS — K7689 Other specified diseases of liver: Secondary | ICD-10-CM | POA: Diagnosis not present

## 2014-01-13 DIAGNOSIS — M1A9XX0 Chronic gout, unspecified, without tophus (tophi): Secondary | ICD-10-CM

## 2014-01-13 DIAGNOSIS — M1A00X0 Idiopathic chronic gout, unspecified site, without tophus (tophi): Secondary | ICD-10-CM | POA: Diagnosis not present

## 2014-01-13 DIAGNOSIS — K639 Disease of intestine, unspecified: Secondary | ICD-10-CM | POA: Diagnosis present

## 2014-01-13 DIAGNOSIS — K2289 Other specified disease of esophagus: Secondary | ICD-10-CM | POA: Diagnosis present

## 2014-01-13 DIAGNOSIS — L299 Pruritus, unspecified: Secondary | ICD-10-CM | POA: Insufficient documentation

## 2014-01-13 DIAGNOSIS — D509 Iron deficiency anemia, unspecified: Secondary | ICD-10-CM | POA: Diagnosis present

## 2014-01-13 DIAGNOSIS — K208 Other esophagitis without bleeding: Secondary | ICD-10-CM | POA: Diagnosis not present

## 2014-01-13 DIAGNOSIS — M109 Gout, unspecified: Secondary | ICD-10-CM | POA: Diagnosis not present

## 2014-01-13 DIAGNOSIS — K2970 Gastritis, unspecified, without bleeding: Secondary | ICD-10-CM | POA: Diagnosis present

## 2014-01-13 DIAGNOSIS — R109 Unspecified abdominal pain: Secondary | ICD-10-CM | POA: Diagnosis not present

## 2014-01-13 DIAGNOSIS — K228 Other specified diseases of esophagus: Secondary | ICD-10-CM | POA: Diagnosis present

## 2014-01-13 DIAGNOSIS — K2980 Duodenitis without bleeding: Secondary | ICD-10-CM | POA: Diagnosis present

## 2014-01-13 DIAGNOSIS — E669 Obesity, unspecified: Secondary | ICD-10-CM | POA: Diagnosis present

## 2014-01-13 DIAGNOSIS — E872 Acidosis, unspecified: Secondary | ICD-10-CM | POA: Diagnosis not present

## 2014-01-13 MED ORDER — POLYETHYLENE GLYCOL 3350 17 GM/SCOOP PO POWD: 1.0000 | Freq: Once | ORAL | Status: DC

## 2014-01-13 MED ORDER — DIPHENHYDRAMINE HCL 25 MG PO CAPS: 25.0000 mg | ORAL_CAPSULE | Freq: Three times a day (TID) | ORAL | Status: DC | PRN

## 2014-01-13 MED ORDER — SENNOSIDES-DOCUSATE SODIUM 8.6-50 MG PO TABS: 1.0000 | ORAL_TABLET | Freq: Every day | ORAL | Status: DC

## 2014-01-13 NOTE — Progress Notes (Signed)
Subjective:   Patient ID: Luke Dunn male   DOB: [birth year] 63 y.o.   MRN: [id number]  HPI: Luke Dunn is a 63 y.o. male w/ PMHx of HTN, CKD stage 4, and gout, presents to the clinic today for a follow-up visit. Luke Dunn was seen in clinic on 09/19/13 for an acute gout flare at which time he was placed on Allopurinol 100 mg qd and Colchicine 0.3 mg qd at that time. Uric acid during that visit was 11.8. Today, the patient is without a gout flare and denies any pain in his lower extremities. He has been compliant with these medications. More importantly however, the patient claims to have decreased appetite, nausea w/ eating food, and vomiting. He states he is unable to keep much down and says he has not had a full meal for 1 week. Additionally the patient states he has not had a bowel movement in ~3 weeks. Per chart review, the patient has also lost ~20 lbs over the last year and about 12 lbs since 05/2013. The patient states his appetite has not been well for the past 1 month and also describes generalized pruritis over the same period of time. He mostly describes this itching in his right arm and lower back, to the point where it has been interfering with his sleep.  He otherwise denies any fever, chills, diarrhea, urinary complications, or lower extremity edema.   Past Medical History  Diagnosis Date  . Gout   . CKD (chronic kidney disease) stage 4, GFR 15-29 ml/min     Followed by [facility].  Thought to be due to long-standing HTN  . Essential hypertension   . Gout 05/04/2009    Qualifier: Diagnosis of  By: Dyann Kief MD, Clifton James     Current Outpatient Prescriptions  Medication Sig Dispense Refill  . allopurinol (ZYLOPRIM) 100 MG tablet Take 1 tablet (100 mg total) by mouth daily.  90 tablet  0  . COLCRYS 0.6 MG tablet TAKE 1/2 TABLET BY MOUTH EVERY DAY  15 tablet  5  . diclofenac sodium (VOLTAREN) 1 % GEL Apply 2 g topically 3 (three) times daily as needed (right wrist pain). Apply to right  wrist.  100 g  1  . furosemide (LASIX) 40 MG tablet Take 1 tablet (40 mg total) by mouth daily.  90 tablet  3  . labetalol (NORMODYNE) 200 MG tablet Take 1 tablet (200 mg total) by mouth 2 (two) times daily.  60 tablet  5  . lisinopril (PRINIVIL,ZESTRIL) 10 MG tablet Take 1 tablet (10 mg total) by mouth daily.  30 tablet  11   No current facility-administered medications for this visit.    Review of Systems: General: Positive for decreased appetite and fatigue. Denies fever, chills, diaphoresis.  Respiratory: Denies SOB, DOE, cough, and wheezing.   Cardiovascular: Denies chest pain and palpitations.  Gastrointestinal: Positive for nausea, vomiting, and constipation. Denies abdominal pain, and diarrhea.  Genitourinary: Denies dysuria, increased frequency, and flank pain. Endocrine: Denies hot or cold intolerance, polyuria, and polydipsia. Musculoskeletal: Denies myalgias, back pain, joint swelling, arthralgias and gait problem.  Skin: Positive for pruritis. Denies pallor and wounds.  Neurological: Denies dizziness, seizures, syncope, weakness, lightheadedness, numbness and headaches.  Psychiatric/Behavioral: Denies mood changes, and sleep disturbances.  Objective:   Physical Exam: Filed Vitals:   01/13/14 1541  BP: 120/60  Pulse: 86  Temp: 98.4 F (36.9 C)  TempSrc: Oral   Physical Exam: General: [place] speaking male, alert, cooperative, NAD. HEENT:  PERRL, EOMI. Moist mucus membranes. Poor dentition. Neck: Full range of motion without pain, supple, no lymphadenopathy or carotid bruits Lungs: Clear to ascultation bilaterally, normal work of respiration, no wheezes, rales, rhonchi Heart: RRR, no murmurs, gallops, or rubs Abdomen: Soft, non-tender, mildly distended, umbilical hernia present, reducible. Infrequent bowel sounds present.  Extremities: No cyanosis, clubbing, or edema Neurologic: Alert & oriented X3, cranial nerves II-XII intact, strength grossly intact, sensation  intact to light touch   Assessment & Plan:   Please see problem based assessment and plan.

## 2014-01-13 NOTE — Patient Instructions (Addendum)
General Instructions:  1. Please schedule a follow up appointment for 2 weeks.  PLEASE GET ABDOMINAL XRAY UPSTAIRS  TAKE MIRALAX + SENOKOT-S FOR CONSTIPATION.   I WILL CALL YOU WITH MEDICATION CHANGES.   2. Please take all medications as prescribed.   3. If you have worsening of your symptoms or new symptoms arise, please call the clinic [phone]), or go to the ER immediately if symptoms are severe.  You have done a great job in taking all your medications. I appreciate it very much. Please continue doing that.  Thank you for bringing your medicines today. This helps [place] keep you safe from mistakes.   Progress Toward Treatment Goals:  Treatment Goal 01/13/2014  Blood pressure at goal    Self Care Goals & Plans:  Self Care Goal 01/13/2014  Manage my medications bring my medications to every visit; refill my medications on time  Monitor my health -  Eat healthy foods drink diet soda or water instead of juice or soda; eat more vegetables; eat foods that are low in salt  Be physically active find an activity I enjoy; take a walk every day  Meeting treatment goals maintain the current self-care plan    No flowsheet data found.   Care Management & Community Referrals:  Referral 01/13/2014  Referrals made for care management support none needed  Referrals made to community resources none

## 2014-01-14 ENCOUNTER — Telehealth: Payer: Self-pay | Admitting: Internal Medicine

## 2014-01-14 ENCOUNTER — Encounter: Payer: Self-pay | Admitting: Internal Medicine

## 2014-01-14 ENCOUNTER — Ambulatory Visit (INDEPENDENT_AMBULATORY_CARE_PROVIDER_SITE_OTHER): Payer: Medicare Other | Admitting: Internal Medicine

## 2014-01-14 ENCOUNTER — Inpatient Hospital Stay (HOSPITAL_COMMUNITY): Payer: Medicare Other

## 2014-01-14 ENCOUNTER — Encounter (HOSPITAL_COMMUNITY): Payer: Self-pay | Admitting: General Practice

## 2014-01-14 ENCOUNTER — Inpatient Hospital Stay (HOSPITAL_COMMUNITY)
Admission: AD | Admit: 2014-01-14 | Discharge: 2014-01-18 | DRG: 684 | Disposition: A | Discharge status: Home / Self Care | Payer: Medicare Other | Source: Ambulatory Visit | Attending: Internal Medicine | Admitting: Internal Medicine

## 2014-01-14 ENCOUNTER — Telehealth: Payer: Self-pay | Admitting: *Deleted

## 2014-01-14 ENCOUNTER — Other Ambulatory Visit: Payer: Medicare Other

## 2014-01-14 VITALS — BP 95/71 | HR 65 | Temp 97.7°F | Ht 63.0 in

## 2014-01-14 DIAGNOSIS — N184 Chronic kidney disease, stage 4 (severe): Secondary | ICD-10-CM | POA: Diagnosis not present

## 2014-01-14 DIAGNOSIS — I129 Hypertensive chronic kidney disease with stage 1 through stage 4 chronic kidney disease, or unspecified chronic kidney disease: Secondary | ICD-10-CM

## 2014-01-14 DIAGNOSIS — E669 Obesity, unspecified: Secondary | ICD-10-CM | POA: Diagnosis present

## 2014-01-14 DIAGNOSIS — M109 Gout, unspecified: Secondary | ICD-10-CM | POA: Diagnosis not present

## 2014-01-14 DIAGNOSIS — Z6830 Body mass index (BMI) 30.0-30.9, adult: Secondary | ICD-10-CM | POA: Diagnosis not present

## 2014-01-14 DIAGNOSIS — K5900 Constipation, unspecified: Secondary | ICD-10-CM | POA: Diagnosis present

## 2014-01-14 DIAGNOSIS — R1110 Vomiting, unspecified: Secondary | ICD-10-CM | POA: Diagnosis not present

## 2014-01-14 DIAGNOSIS — K429 Umbilical hernia without obstruction or gangrene: Secondary | ICD-10-CM | POA: Diagnosis present

## 2014-01-14 DIAGNOSIS — K2289 Other specified disease of esophagus: Secondary | ICD-10-CM | POA: Diagnosis not present

## 2014-01-14 DIAGNOSIS — N179 Acute kidney failure, unspecified: Secondary | ICD-10-CM | POA: Insufficient documentation

## 2014-01-14 DIAGNOSIS — I10 Essential (primary) hypertension: Secondary | ICD-10-CM | POA: Diagnosis present

## 2014-01-14 DIAGNOSIS — R634 Abnormal weight loss: Secondary | ICD-10-CM | POA: Diagnosis not present

## 2014-01-14 DIAGNOSIS — R630 Anorexia: Secondary | ICD-10-CM | POA: Diagnosis not present

## 2014-01-14 DIAGNOSIS — R109 Unspecified abdominal pain: Secondary | ICD-10-CM | POA: Diagnosis not present

## 2014-01-14 DIAGNOSIS — K7689 Other specified diseases of liver: Secondary | ICD-10-CM | POA: Diagnosis not present

## 2014-01-14 DIAGNOSIS — R1084 Generalized abdominal pain: Secondary | ICD-10-CM | POA: Diagnosis not present

## 2014-01-14 DIAGNOSIS — D126 Benign neoplasm of colon, unspecified: Secondary | ICD-10-CM | POA: Diagnosis not present

## 2014-01-14 DIAGNOSIS — N189 Chronic kidney disease, unspecified: Secondary | ICD-10-CM

## 2014-01-14 DIAGNOSIS — K2980 Duodenitis without bleeding: Secondary | ICD-10-CM | POA: Diagnosis not present

## 2014-01-14 DIAGNOSIS — E875 Hyperkalemia: Secondary | ICD-10-CM | POA: Diagnosis present

## 2014-01-14 DIAGNOSIS — Z87891 Personal history of nicotine dependence: Secondary | ICD-10-CM | POA: Diagnosis not present

## 2014-01-14 DIAGNOSIS — M25569 Pain in unspecified knee: Secondary | ICD-10-CM | POA: Diagnosis present

## 2014-01-14 DIAGNOSIS — N2589 Other disorders resulting from impaired renal tubular function: Secondary | ICD-10-CM | POA: Diagnosis present

## 2014-01-14 DIAGNOSIS — E872 Acidosis, unspecified: Secondary | ICD-10-CM | POA: Diagnosis not present

## 2014-01-14 DIAGNOSIS — K228 Other specified diseases of esophagus: Secondary | ICD-10-CM | POA: Diagnosis not present

## 2014-01-14 DIAGNOSIS — K639 Disease of intestine, unspecified: Secondary | ICD-10-CM | POA: Diagnosis present

## 2014-01-14 DIAGNOSIS — R1310 Dysphagia, unspecified: Secondary | ICD-10-CM | POA: Diagnosis not present

## 2014-01-14 DIAGNOSIS — L299 Pruritus, unspecified: Secondary | ICD-10-CM | POA: Diagnosis not present

## 2014-01-14 DIAGNOSIS — Z23 Encounter for immunization: Secondary | ICD-10-CM

## 2014-01-14 DIAGNOSIS — D509 Iron deficiency anemia, unspecified: Secondary | ICD-10-CM | POA: Diagnosis present

## 2014-01-14 DIAGNOSIS — N281 Cyst of kidney, acquired: Secondary | ICD-10-CM | POA: Diagnosis not present

## 2014-01-14 DIAGNOSIS — K2970 Gastritis, unspecified, without bleeding: Secondary | ICD-10-CM | POA: Diagnosis not present

## 2014-01-14 DIAGNOSIS — K208 Other esophagitis without bleeding: Secondary | ICD-10-CM | POA: Diagnosis not present

## 2014-01-14 DIAGNOSIS — K2990 Gastroduodenitis, unspecified, without bleeding: Secondary | ICD-10-CM | POA: Diagnosis present

## 2014-01-14 DIAGNOSIS — K5730 Diverticulosis of large intestine without perforation or abscess without bleeding: Secondary | ICD-10-CM | POA: Diagnosis not present

## 2014-01-14 LAB — CBC WITH DIFFERENTIAL/PLATELET
BASOS ABS: 0 10*3/uL (ref 0.0–0.1)
Basophils Relative: 0 % (ref 0–1)
EOS PCT: 13 % — AB (ref 0–5)
Eosinophils Absolute: 0.6 10*3/uL (ref 0.0–0.7)
HCT: 30.1 % — ABNORMAL LOW (ref 39.0–52.0)
Hemoglobin: 9.9 g/dL — ABNORMAL LOW (ref 13.0–17.0)
LYMPHS PCT: 24 % (ref 12–46)
Lymphs Abs: 1.2 10*3/uL (ref 0.7–4.0)
MCH: 26.7 pg (ref 26.0–34.0)
MCHC: 32.9 g/dL (ref 30.0–36.0)
MCV: 81.1 fL (ref 78.0–100.0)
Monocytes Absolute: 0.3 10*3/uL (ref 0.1–1.0)
Monocytes Relative: 7 % (ref 3–12)
NEUTROS ABS: 2.7 10*3/uL (ref 1.7–7.7)
Neutrophils Relative %: 56 % (ref 43–77)
PLATELETS: 137 10*3/uL — AB (ref 150–400)
RBC: 3.71 MIL/uL — ABNORMAL LOW (ref 4.22–5.81)
RDW: 17.4 % — AB (ref 11.5–15.5)
WBC: 4.8 10*3/uL (ref 4.0–10.5)

## 2014-01-14 LAB — COMPLETE METABOLIC PANEL WITH GFR
AST: 16 U/L (ref 0–37)
Albumin: 4.8 g/dL (ref 3.5–5.2)
Alkaline Phosphatase: 67 U/L (ref 39–117)
BUN: 71 mg/dL — ABNORMAL HIGH (ref 6–23)
CALCIUM: 9.1 mg/dL (ref 8.4–10.5)
CHLORIDE: 115 meq/L — AB (ref 96–112)
CO2: 18 mEq/L — ABNORMAL LOW (ref 19–32)
CREATININE: 4.91 mg/dL — AB (ref 0.50–1.35)
GFR, Est African American: 13 mL/min — ABNORMAL LOW
GFR, Est Non African American: 11 mL/min — ABNORMAL LOW
Glucose, Bld: 82 mg/dL (ref 70–99)
Potassium: 6.5 mEq/L (ref 3.5–5.3)
Sodium: 140 mEq/L (ref 135–145)
Total Bilirubin: 0.3 mg/dL (ref 0.2–1.2)
Total Protein: 7.8 g/dL (ref 6.0–8.3)

## 2014-01-14 LAB — URINALYSIS, ROUTINE W REFLEX MICROSCOPIC
Bilirubin Urine: NEGATIVE
GLUCOSE, UA: NEGATIVE mg/dL
HGB URINE DIPSTICK: NEGATIVE
Ketones, ur: NEGATIVE mg/dL
Leukocytes, UA: NEGATIVE
Nitrite: NEGATIVE
Protein, ur: NEGATIVE mg/dL
Specific Gravity, Urine: 1.011 (ref 1.005–1.030)
UROBILINOGEN UA: 0.2 mg/dL (ref 0.0–1.0)
pH: 5.5 (ref 5.0–8.0)

## 2014-01-14 LAB — COMPREHENSIVE METABOLIC PANEL
ALBUMIN: 4.1 g/dL (ref 3.5–5.2)
ALT: 7 U/L (ref 0–53)
AST: 19 U/L (ref 0–37)
Alkaline Phosphatase: 75 U/L (ref 39–117)
BUN: 71 mg/dL — AB (ref 6–23)
CALCIUM: 9.4 mg/dL (ref 8.4–10.5)
CHLORIDE: 111 meq/L (ref 96–112)
CO2: 17 meq/L — AB (ref 19–32)
Creat: 5.02 mg/dL — ABNORMAL HIGH (ref 0.50–1.35)
GLUCOSE: 95 mg/dL (ref 70–99)
POTASSIUM: 6 meq/L — AB (ref 3.5–5.3)
Sodium: 143 mEq/L (ref 135–145)
Total Bilirubin: 0.3 mg/dL (ref 0.2–1.2)
Total Protein: 7.9 g/dL (ref 6.0–8.3)

## 2014-01-14 LAB — POTASSIUM: POTASSIUM: 5.5 meq/L — AB (ref 3.7–5.3)

## 2014-01-14 LAB — URIC ACID: URIC ACID, SERUM: 10.7 mg/dL — AB (ref 4.0–7.8)

## 2014-01-14 LAB — SODIUM, URINE, RANDOM: SODIUM UR: 78 meq/L

## 2014-01-14 LAB — CREATININE, URINE, RANDOM: CREATININE, URINE: 91.7 mg/dL

## 2014-01-14 MED ORDER — DEXTROSE 50 % IV SOLN: 1.0000 | Freq: Once | INTRAVENOUS | Status: DC

## 2014-01-14 MED ORDER — SODIUM POLYSTYRENE SULFONATE 15 GM/60ML PO SUSP
15.0000 g | Freq: Once | ORAL | Status: AC
  Administered 2014-01-14: 15 g via ORAL

## 2014-01-14 MED ORDER — HEPARIN SODIUM (PORCINE) 5000 UNIT/ML IJ SOLN
5000.0000 [IU] | Freq: Three times a day (TID) | INTRAMUSCULAR | Status: DC
  Administered 2014-01-14 – 2014-01-16 (×6): 5000 [IU] via SUBCUTANEOUS
  Filled 2014-01-14 (×8): qty 1

## 2014-01-14 MED ORDER — SODIUM CHLORIDE 0.9 % IV SOLN
1.0000 g | Freq: Once | INTRAVENOUS | Status: AC
  Administered 2014-01-14: 1 g via INTRAVENOUS
  Filled 2014-01-14 (×2): qty 10

## 2014-01-14 MED ORDER — ACETAMINOPHEN 325 MG PO TABS: 650.0000 mg | ORAL_TABLET | Freq: Four times a day (QID) | ORAL | Status: DC | PRN

## 2014-01-14 MED ORDER — PROMETHAZINE HCL 25 MG PO TABS
12.5000 mg | ORAL_TABLET | Freq: Four times a day (QID) | ORAL | Status: DC | PRN
Start: 2014-01-14 — End: 2014-01-18

## 2014-01-14 MED ORDER — IOHEXOL 300 MG/ML  SOLN
50.0000 mL | INTRAMUSCULAR | Status: AC
  Administered 2014-01-14 (×2): 50 mL via ORAL

## 2014-01-14 MED ORDER — INSULIN ASPART 100 UNIT/ML IV SOLN: 10.0000 [IU] | Freq: Once | INTRAVENOUS | Status: DC

## 2014-01-14 MED ORDER — MORPHINE SULFATE 2 MG/ML IJ SOLN: 1.0000 mg | INTRAMUSCULAR | Status: DC | PRN

## 2014-01-14 MED ORDER — ACETAMINOPHEN 650 MG RE SUPP: 650.0000 mg | Freq: Four times a day (QID) | RECTAL | Status: DC | PRN

## 2014-01-14 MED ORDER — LABETALOL HCL 200 MG PO TABS
200.0000 mg | ORAL_TABLET | Freq: Two times a day (BID) | ORAL | Status: DC
  Administered 2014-01-14 – 2014-01-16 (×3): 200 mg via ORAL
  Filled 2014-01-14 (×5): qty 1

## 2014-01-14 MED ORDER — SODIUM CHLORIDE 0.9 % IV SOLN
INTRAVENOUS | Status: DC
  Administered 2014-01-14 – 2014-01-15 (×2): via INTRAVENOUS

## 2014-01-14 MED ORDER — ALBUTEROL SULFATE (2.5 MG/3ML) 0.083% IN NEBU
2.5000 mg | INHALATION_SOLUTION | RESPIRATORY_TRACT | Status: DC | PRN
Start: 2014-01-14 — End: 2014-01-18

## 2014-01-14 MED ORDER — SENNOSIDES-DOCUSATE SODIUM 8.6-50 MG PO TABS
1.0000 | ORAL_TABLET | Freq: Every day | ORAL | Status: DC
  Administered 2014-01-14 – 2014-01-18 (×4): 1 via ORAL
  Filled 2014-01-14 (×4): qty 1

## 2014-01-14 MED ORDER — SODIUM CHLORIDE 0.9 % IJ SOLN
3.0000 mL | Freq: Two times a day (BID) | INTRAMUSCULAR | Status: DC
  Administered 2014-01-16 – 2014-01-18 (×4): 3 mL via INTRAVENOUS

## 2014-01-14 MED ORDER — CALCIUM GLUCONATE 10 % IV SOLN: 1.0000 g | Freq: Once | INTRAVENOUS | Status: DC

## 2014-01-14 MED ORDER — ALBUTEROL SULFATE (2.5 MG/3ML) 0.083% IN NEBU
2.5000 mg | INHALATION_SOLUTION | Freq: Once | RESPIRATORY_TRACT | Status: AC
  Administered 2014-01-14: 2.5 mg via RESPIRATORY_TRACT

## 2014-01-14 NOTE — Assessment & Plan Note (Addendum)
Placed on Allopurinol 100 mg po qd + Colchicine 0.3 mg po qd during last visit on 09/19/13 for gout flare. Uric acid 11.8 at that time, now 10.7.  -Would typically increase Allopurinol to account for uric acid in order to attempt to decrease to <6, however, given significant increase in Cr to >4, will hold both allopurinol and colchicine for now -Given other findings such as pruritis, nausea, decreased appetite, and eosinophilia, concern for DRESS.

## 2014-01-14 NOTE — Assessment & Plan Note (Addendum)
Patient claims to have pruritis, mainly located on his right upper extremity as well as his lower back. He is noted to have mild excoriations and small raised papules on his right arm. Some concern for lymphoma/malignancy in the setting of other signs/symptoms reported as described. Eosinophils 13%, likely related to pruritis as well. Given recent addition of Allopurinol, also considering DRESS given additional symptoms as mentioned such as nausea, weight loss, decreased appetite.  -Given Benadryl prn for sleep/pruritis

## 2014-01-14 NOTE — Telephone Encounter (Signed)
I received a call from [facility] lab this morning in regards to a critical lab value for [name].   K of 6.5 on CMET from 01/13/14, that was repeated and verified and no hemolysis.   [name] was seen in [facility] yesterday by PCP Dr. Ronnald Ramp for follow up of acute gout flare.   I will forward this note to Dr. Ronnald Ramp as Mr. Cosgrove will likely need to return to [facility] today for repeat labs. I tried calling [name] on the phone this morning to check on him, however, someone answered but does not speak English and could not confirm identity or voice understanding of what I was saying. At this time, I do not know what language he speaks, thus I will try to reach Dr. Ronnald Ramp as soon as possible so that he can get in touch with his patient.   Signed: Jerene Pitch, MD PGY-3, Internal Medicine Resident Pager: [phone]   01/14/2014,7:26 AM

## 2014-01-14 NOTE — Assessment & Plan Note (Addendum)
Cr 2.76 at last visit (GFR not reported). Today, Cr is 4.91 w/ GFR of 11, significantly worsened from before. Additionally, K is 6.5. Patient follows w/ [facility] (Dr. Justin Mend). Most likely related to poor appetite and limited po intake vs Allopurinol and Colchicine.  -Patient to return to clinic for repeat labs and likely admission for further management of acute on CKD and hyperkalemia.  -Hold Allopurinol + Colchicine for now

## 2014-01-14 NOTE — Telephone Encounter (Signed)
Spoke with Dr. Ronnald Ramp this am about plan for  Patients elevated Potassium level.  Dr. Ronnald Ramp had spoken to C. O'Brien-Congregational Nurse working with patient. Patient is to come in this afternoon for repeat labs.  Call to C. O'Brien-plans to go to patients home with an interpreter  to advise him of need for repeat labs and to get him in for th lab work.  Also gave her the order from Dr. Ronnald Ramp to stop his Allopurinol and Colcrys as well.  Arty Baumgartner to get message to patient when she visits his home today.  Patient is coming in around 1:30 PM for stat  labs today and to wait for results.  Sander Nephew, RN 01/14/2014 11:31 AM.

## 2014-01-14 NOTE — Assessment & Plan Note (Addendum)
Patient noted to have significant weight loss over the past year, now 182 lbs, down from 203 lbs about 1 year ago. Patient has been unable to tolerate food recently 2/2 nausea, vomiting, and decreased appetite. CBC shows no leukocytosis, however, eosinophil count shown to be 13%. Abdominal XR performed as patient mentioned no bowel movement for ~3 weeks, which suggested an ill-defined soft tissue prominence noted over the right flank. This could represent prominent abdominal wall musculature however right flank/abdominal sidewall/right pericolic mass cannot be excluded. XR also suggested questionable mild left colonic wall thickening. Some concern for malignancy/lymphoma. -Recommend further evaluation w/ CT abdomen, however given poor renal function, contrast is contraindicated.  -Will likely need admission for further workup

## 2014-01-14 NOTE — H&P (Signed)
Date: 01/14/2014               Patient Name:  Luke Dunn MRN: [id number]  DOB: 11-Aug-1950 Age / Sex: 63 y.o., male   PCP: Corky Sox, MD         Medical Service: Internal Medicine Teaching Service         Attending Physician: Dr. Bartholomew Crews, MD    First Contact: Dr. Genene Churn Pager: [phone]  Second Contact: Dr. Hayes Ludwig Pager: [phone]       After Hours (After 5p/  First Contact Pager: [phone]  weekends / holidays): Second Contact Pager: [phone]   Chief Complaint: hyperkalemia, elevated Cr  History of Present Illness:   63 yo male with hx of CKD IV, HTN, gout presents with AKI, hyperkalemia, and abdominal tenderness.  Yesterday  He came to clinic for follow up and labs were drawn. Today his labs showed He has been taking allopurinol 100mg  QD and Colchicine 0.3 mg qd for gout flare since 10/03/13. He was back today because labs drawn yesterday showed K+ 6.5, with BUN/crt 71/4.91. Baseline crt around 2.76. He was given kayexalate, no EKG changes. He also had an abdominal xray showing a possible pericolic mass. He had been constipated for last 3 weeks without any bowel movement. He throws up whatever he eats. But he can tolerate water intake.Per chart review, the patient has also lost ~20 lbs over the last year and about 12 lbs since 05/2013. The patient states his appetite has not been well for the past 1 month.  He is having good UOP. Has knee pain both legs but denies pain anywhere else. Drinking plenty of water daily.  Patient is vietnamese speaking only.    Meds: Current Facility-Administered Medications  Medication Dose Route Frequency Provider Last Rate Last Dose  . 0.9 %  sodium chloride infusion   Intravenous Continuous Blain Pais, MD 100 mL/hr at 01/14/14 1941    . acetaminophen (TYLENOL) tablet 650 mg  650 mg Oral Q6H PRN Blain Pais, MD       Or  . acetaminophen (TYLENOL) suppository 650 mg  650 mg Rectal Q6H PRN Blain Pais, MD      .  albuterol (PROVENTIL) (2.5 MG/3ML) 0.083% nebulizer solution 2.5 mg  2.5 mg Nebulization Q2H PRN Blain Pais, MD      . calcium gluconate 1 g in sodium chloride 0.9 % 100 mL IVPB  1 g Intravenous Once Blain Pais, MD      . dextrose 50 % solution 50 mL  1 ampule Intravenous Once Blain Pais, MD      . heparin injection 5,000 Units  5,000 Units Subcutaneous 3 times per day Blain Pais, MD      . insulin aspart (novoLOG) injection 10 Units  10 Units Intravenous Once Blain Pais, MD      . iohexol (OMNIPAQUE) 300 MG/ML solution 50 mL  50 mL Oral Q1 Hr x 2 Medication Radiologist, MD   50 mL at 01/14/14 1910  . labetalol (NORMODYNE) tablet 200 mg  200 mg Oral BID Blain Pais, MD      . morphine 2 MG/ML injection 1 mg  1 mg Intravenous Q4H PRN Blain Pais, MD      . promethazine (PHENERGAN) tablet 12.5 mg  12.5 mg Oral Q6H PRN Blain Pais, MD      . senna-docusate (Senokot-S) tablet 1 tablet  1 tablet Oral Daily Milbert Coulter D  Hayes Ludwig, MD      . sodium chloride 0.9 % injection 3 mL  3 mL Intravenous Q12H Blain Pais, MD        Allergies: Allergies as of 01/14/2014  . (No Known Allergies)   Past Medical History  Diagnosis Date  . Gout   . CKD (chronic kidney disease) stage 4, GFR 15-29 ml/min     Followed by [facility].  Thought to be due to long-standing HTN  . Essential hypertension   . Gout 05/04/2009    Qualifier: Diagnosis of  By: Dyann Kief MD, Clifton James     Past Surgical History  Procedure Laterality Date  . No past surgeries     History reviewed. No pertinent family history. History   Social History  . Marital Status: Single    Spouse Name: N/A    Number of Children: N/A  . Years of Education: N/A   Occupational History  . Not on file.   Social History Main Topics  . Smoking status: Former Smoker -- 1.00 packs/day for 37 years    Quit date: 02/01/2007  . Smokeless tobacco: Former Systems developer    Quit date:  02/01/2007  . Alcohol Use: No  . Drug Use: No  . Sexual Activity: Not on file   Other Topics Concern  . Not on file   Social History Narrative   [name] speaking only, low educational level, works in Architect.  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       Financial assistance approved for 70% discount at [facility] and has [facility] card.     Review of Systems: Review of Systems  Constitutional: Positive for chills and weight loss. Negative for fever.  HENT: Negative.   Eyes: Negative.   Respiratory: Negative.   Cardiovascular: Negative.   Gastrointestinal: Positive for nausea, vomiting, abdominal pain and constipation. Negative for diarrhea, blood in stool and melena.  Skin: Positive for itching.  Neurological: Negative.  Negative for weakness.  Psychiatric/Behavioral: Negative.      Physical Exam: Blood pressure 106/53, pulse 67, temperature 97.5 F (36.4 C), temperature source Oral, resp. rate 18, height 5\' 3"  (1.6 m), weight 81.2 kg (179 lb 0.2 oz), SpO2 100.00%.  Physical Exam  Constitutional: He is oriented to person, place, and time. He appears well-developed and well-nourished. He appears distressed.  HENT:  Head: Normocephalic and atraumatic.  Right Ear: External ear normal.  Left Ear: External ear normal.  Nose: Nose normal.  Mouth/Throat: Oropharynx is clear and moist. No oropharyngeal exudate.  Eyes: Conjunctivae and EOM are normal. Pupils are equal, round, and reactive to light. Right eye exhibits no discharge. Left eye exhibits no discharge. No scleral icterus.  Neck: Normal range of motion. Neck supple. No JVD present.  Cardiovascular: Normal rate and regular rhythm.  Exam reveals no gallop and no friction rub.   No murmur heard. Respiratory: Effort normal and breath sounds normal. No respiratory distress. He has no wheezes. He has no rales. He exhibits no tenderness.  GI: Soft. Bowel sounds are normal. He exhibits distension. He exhibits no mass. There is tenderness in the  left upper quadrant. There is rigidity. There is no rebound, no guarding and negative Murphy's sign.  Musculoskeletal: Normal range of motion. He exhibits no edema and no tenderness.  Neurological: He is alert and oriented to person, place, and time. He has normal reflexes. No cranial nerve deficit. Coordination normal.  Skin: Skin is warm and dry.  Psychiatric: He has a normal mood and affect.  Lab results: Basic Metabolic Panel:  Recent Labs  01/13/14 1639 01/14/14 1406  NA 140 143  K 6.5* 6.0*  CL 115* 111  CO2 18* 17*  GLUCOSE 82 95  BUN 71* 71*  CREATININE 4.91* 5.02*  CALCIUM 9.1 9.4   Liver Function Tests:  Recent Labs  01/13/14 1639 01/14/14 1406  AST 16 19  ALT <8 7  ALKPHOS 67 75  BILITOT 0.3 0.3  PROT 7.8 7.9  ALBUMIN 4.8 4.1   No results found for this basename: LIPASE, AMYLASE,  in the last 72 hours No results found for this basename: AMMONIA,  in the last 72 hours CBC:  Recent Labs  01/13/14 1639  WBC 4.8  NEUTROABS 2.7  HGB 9.9*  HCT 30.1*  MCV 81.1  PLT 137*   Cardiac Enzymes: No results found for this basename: CKTOTAL, CKMB, CKMBINDEX, TROPONINI,  in the last 72 hours BNP: No results found for this basename: PROBNP,  in the last 72 hours D-Dimer: No results found for this basename: DDIMER,  in the last 72 hours CBG: No results found for this basename: GLUCAP,  in the last 72 hours Hemoglobin A1C: No results found for this basename: HGBA1C,  in the last 72 hours Fasting Lipid Panel: No results found for this basename: CHOL, HDL, LDLCALC, TRIG, CHOLHDL, LDLDIRECT,  in the last 72 hours Thyroid Function Tests: No results found for this basename: TSH, T4TOTAL, FREET4, T3FREE, THYROIDAB,  in the last 72 hours Anemia Panel: No results found for this basename: VITAMINB12, FOLATE, FERRITIN, TIBC, IRON, RETICCTPCT,  in the last 72 hours Coagulation: No results found for this basename: LABPROT, INR,  in the last 72 hours Urine Drug  Screen: Drugs of Abuse  No results found for this basename: labopia,  cocainscrnur,  labbenz,  amphetmu,  thcu,  labbarb    Alcohol Level: No results found for this basename: ETH,  in the last 72 hours Urinalysis: No results found for this basename: COLORURINE, APPERANCEUR, LABSPEC, PHURINE, GLUCOSEU, HGBUR, BILIRUBINUR, KETONESUR, PROTEINUR, UROBILINOGEN, NITRITE, LEUKOCYTESUR,  in the last 72 hours Misc. Labs:   Imaging results:  Dg Abd 1 View  01/14/2014   CLINICAL DATA:  Constipation.  Nausea.  EXAM: ABDOMEN - 1 VIEW  COMPARISON:  None.  FINDINGS: Soft tissue prominence noted over the right flank. This could just represent prominent abdominal wall musculature however a right flank/abdominal sidewall/ right pericolic mass cannot be excluded. Minimal thickening of the left colonic wall cannot be excluded. IV contrast enhanced CT abdomen and pelvis suggested to further evaluate these findings. The gas pattern is nonspecific. No large stool volume noted to suggest constipation. Moderate amount of stool in the right colon. No free air. Mild left base subsegmental atelectasis. Thoracolumbar spine ankylosis consistent with ankylosing spondylitis. Degenerative changes both hips. No evidence of sacroiliitis. No acute bony abnormality. Sclerotic density noted in the left femoral neck. No other sclerotic densities are noted. This is most likely a bone island.  IMPRESSION: 1. Ill-defined soft tissue prominence noted over the right flank. This could represent prominent abdominal wall musculature however right flank/abdominal sidewall/right pericolic mass cannot be excluded. 2. Questionable mild left colonic wall thickening. IV contrast-enhanced CT of the abdomen pelvis suggested for further evaluation of the right flank and left colon. 3. Ankylosing spondylitis thoracolumbar spine. 4. Left base subsegmental atelectasis.   Electronically Signed   By: Marcello Moores  Register   On: 01/14/2014 08:36    Other  results: EKG: NSR.  Assessment & Plan by  Problem: Principal Problem:   Acute renal failure Active Problems:   Essential hypertension, benign   Microcytic anemia   Constipation   Hyperkalemia  42 y man [place] with PMH of HTN, presenting with hyperkalemia (K of 6), ARF, Cr of  5.02 from naseline of 2.7, weight loss with decreased appetite.  ARF: BL Cr of 2.76, in setting of recently starting allopurinol with eosinophilia.  -cont IVF -FENA, renal diet, strict i/o -CT will also show if there is hydronephrosis.  Hyperkalemia: K of 6.5 yesterday, 6.0 today, received nebulizer tx, IV insulin plus D5, kayaxalate - monitor BMP.  - repeat EKG  Possible obstruction - reports constipation for 3 weeks . But passing gas. Could be secondary to mass.   abd xray shows: soft tissue prominence. right pericolic mass cannot be excluded. left colonic wall thickening  -Getting CT scan of abd with no contrast to better characterize the finding - consulted GI.  - NPO for now  HTN - labetalol 200mg  BID continued  dvt ppx: heparin   Dispo: Disposition is deferred at this time, awaiting improvement of current medical problems. Anticipated discharge in approximately 2-3 day(s).   The patient does have a current PCP Corky Sox, MD) and does need an [facility] hospital follow-up appointment after discharge.  The patient does have transportation limitations that hinder transportation to clinic appointments.  Signed: Dellia Nims, MD 01/14/2014, 8:08 PM

## 2014-01-14 NOTE — Assessment & Plan Note (Signed)
Patient claims he has not been able to eat much over the past several weeks. Claims this is associated w/ nausea, and weight loss. Some concern for malignancy. CBC w/ no leukocytosis. Abd XR not suggestive of obstruction.

## 2014-01-14 NOTE — Assessment & Plan Note (Addendum)
Patient states he has not had a bowel movement in 3 weeks, however, he also states his appetite has been very poor and he has not eaten a full meal in over a week. No tenderness on abdominal exam. Bowel sounds infrequent, mild distension present. Some concern for intraabdominal process such as a mass vs obstruction. -Abd XR performed, showed a questionable ill-defined soft tissue prominence noted over the right flank. This could represent prominent abdominal wall musculature however right flank/abdominal sidewall/right pericolic mass cannot be excluded. Also shows a questionable mild left colonic wall thickening. The gas pattern is read as nonspecific. No large stool volume noted to suggest constipation. -CT abdomen may be more useful to help discern the specifics of these findings.  -Given Miralax + Senokot S prn for bowel movement

## 2014-01-14 NOTE — Progress Notes (Signed)
Report called to 5w, taken via wh/ch to #38 Attempted iv stick at 1715, R fa, 1 stick, #20, tol well, gauze pad and then bandaid applied

## 2014-01-14 NOTE — Assessment & Plan Note (Addendum)
Patient w/ increased Cr and K of 6.5, most likely 2/2 worsening renal function. Blood work not suggestive of hemolysis.  -Return to clinic for repeat blood work and likely admission -EKG during repeat clinic visit

## 2014-01-14 NOTE — Progress Notes (Signed)
Patient ID: Luke Dunn, male   DOB: 03/29/1951, 63 y.o.   MRN: [id number]   Brief progress note - - -  [name] was seen by Dr. Ronnald Ramp yesterday, found to have an elevated Cr and K, brought back in today for labwork and increase in Cr and K was confirmed.  He also had an abdominal xray showing a possible pericolic mass.    Have called the admitting team for admission.  Will plan to go ahead and get an EKG, give a dose of albuterol and kayexalate if possible.    Vitals were -   Filed Vitals:   01/14/14 1518  BP: 95/71  Pulse: 65  Temp: 97.7 F (36.5 C)   Spoke with Congregational Nurse, [name] who helps [name] with getting to appointments.  She would like updates on his care.  Cell phone - [place]  Gilles Chiquito, MD

## 2014-01-15 DIAGNOSIS — N179 Acute kidney failure, unspecified: Principal | ICD-10-CM

## 2014-01-15 DIAGNOSIS — N189 Chronic kidney disease, unspecified: Secondary | ICD-10-CM

## 2014-01-15 DIAGNOSIS — R1310 Dysphagia, unspecified: Secondary | ICD-10-CM

## 2014-01-15 DIAGNOSIS — M109 Gout, unspecified: Secondary | ICD-10-CM

## 2014-01-15 LAB — CBC
HCT: 28.7 % — ABNORMAL LOW (ref 39.0–52.0)
Hemoglobin: 9.7 g/dL — ABNORMAL LOW (ref 13.0–17.0)
MCH: 27.4 pg (ref 26.0–34.0)
MCHC: 33.8 g/dL (ref 30.0–36.0)
MCV: 81.1 fL (ref 78.0–100.0)
PLATELETS: 94 10*3/uL — AB (ref 150–400)
RBC: 3.54 MIL/uL — ABNORMAL LOW (ref 4.22–5.81)
RDW: 14.9 % (ref 11.5–15.5)
WBC: 4 10*3/uL (ref 4.0–10.5)

## 2014-01-15 LAB — COMPREHENSIVE METABOLIC PANEL
ALK PHOS: 76 U/L (ref 39–117)
ALT: 7 U/L (ref 0–53)
ANION GAP: 15 (ref 5–15)
AST: 17 U/L (ref 0–37)
Albumin: 4 g/dL (ref 3.5–5.2)
BUN: 67 mg/dL — AB (ref 6–23)
CO2: 13 meq/L — AB (ref 19–32)
Calcium: 8.9 mg/dL (ref 8.4–10.5)
Chloride: 111 mEq/L (ref 96–112)
Creatinine, Ser: 4.31 mg/dL — ABNORMAL HIGH (ref 0.50–1.35)
GFR calc non Af Amer: 13 mL/min — ABNORMAL LOW (ref >90)
GFR, EST AFRICAN AMERICAN: 15 mL/min — AB (ref >90)
Glucose, Bld: 83 mg/dL (ref 70–99)
Potassium: 5.4 mEq/L — ABNORMAL HIGH (ref 3.7–5.3)
SODIUM: 139 meq/L (ref 137–147)
Total Bilirubin: 0.3 mg/dL (ref 0.3–1.2)
Total Protein: 7.4 g/dL (ref 6.0–8.3)

## 2014-01-15 LAB — URINALYSIS W MICROSCOPIC (NOT AT ARMC)
Bilirubin Urine: NEGATIVE
Glucose, UA: NEGATIVE mg/dL
Hgb urine dipstick: NEGATIVE
KETONES UR: NEGATIVE mg/dL
Leukocytes, UA: NEGATIVE
NITRITE: NEGATIVE
PH: 5.5 (ref 5.0–8.0)
Protein, ur: NEGATIVE mg/dL
Specific Gravity, Urine: 1.01 (ref 1.005–1.030)
Urobilinogen, UA: 0.2 mg/dL (ref 0.0–1.0)

## 2014-01-15 LAB — HEPATITIS PANEL, ACUTE
HCV Ab: NEGATIVE
Hep A IgM: NONREACTIVE
Hep B C IgM: NONREACTIVE
Hepatitis B Surface Ag: NEGATIVE

## 2014-01-15 LAB — AFP TUMOR MARKER: AFP TUMOR MARKER: 4.1 ng/mL (ref <6.1)

## 2014-01-15 LAB — POTASSIUM
Potassium: 5.9 mEq/L — ABNORMAL HIGH (ref 3.7–5.3)
Potassium: 6.4 mEq/L — ABNORMAL HIGH (ref 3.7–5.3)
Potassium: 5.5 mEq/L — ABNORMAL HIGH (ref 3.7–5.3)

## 2014-01-15 MED ORDER — NEPRO/CARBSTEADY PO LIQD
237.0000 mL | Freq: Three times a day (TID) | ORAL | Status: DC
  Administered 2014-01-15 – 2014-01-17 (×3): 237 mL via ORAL

## 2014-01-15 MED ORDER — DIPHENHYDRAMINE HCL 25 MG PO CAPS
25.0000 mg | ORAL_CAPSULE | Freq: Three times a day (TID) | ORAL | Status: DC | PRN
  Administered 2014-01-15: 25 mg via ORAL
  Filled 2014-01-15: qty 1

## 2014-01-15 MED ORDER — ALBUTEROL SULFATE (2.5 MG/3ML) 0.083% IN NEBU: 10.0000 mg | INHALATION_SOLUTION | Freq: Once | RESPIRATORY_TRACT | Status: DC

## 2014-01-15 MED ORDER — SODIUM POLYSTYRENE SULFONATE 15 GM/60ML PO SUSP
30.0000 g | Freq: Once | ORAL | Status: AC
  Administered 2014-01-15: 30 g via ORAL
  Filled 2014-01-15 (×2): qty 120

## 2014-01-15 MED ORDER — PEG 3350-KCL-NA BICARB-NACL 420 G PO SOLR
4000.0000 mL | Freq: Once | ORAL | Status: AC
  Administered 2014-01-15: 4000 mL via ORAL
  Filled 2014-01-15: qty 4000

## 2014-01-15 MED ORDER — SODIUM CHLORIDE 0.9 % IV SOLN
INTRAVENOUS | Status: DC
  Administered 2014-01-15: 100 mL via INTRAVENOUS
  Administered 2014-01-15: 21:00:00 via INTRAVENOUS

## 2014-01-15 NOTE — Progress Notes (Signed)
  Date: 01/15/2014  Patient name: Luke Dunn  Medical record number: [id number]  Date of birth: January 09, 1951   I have seen and evaluated Luke Dunn and discussed their care with the Residency Team. The team met with Luke Dunn today and used the phone translator. He denies dysphagia but does endorse inability to keep sold food down - vomits it up soon after swallowing it and it is bad tasting. Cannot tell us how long this has been occuring. No blood in vomit. Can take pills and liquids. Reported loss of weight 199 08/2012 to 182 12/2013. Also went three weeks without BM. Had BM after two doses lactulose in hospital and there was no blood. He reports no EGD nor colonoscopy.  He was started on allopurinol and colchicine in May for gout.  On exam, he is in NAD. He has good BS and is soft and non tender. He has an umbilical hernia. HRRR LCTAB. There is no skin rash.  Cr is 5.02, up from 2.76 in May. HgB 9.9. Eos 13 on admit, were 10 in 2010. Uric acid 10.7.   Assessment and Plan: I have seen and evaluated the patient as outlined above. I agree with the formulated Assessment and Plan as detailed in the residents' admission note, with the following changes:   1. Acute on chronic renal failure - with IVF, his Cr decreased to 4.31. The possible causes are pre-renal from decreased PO intake and AIN from allopurinol. We will stop allopurinol and cont IVF and follow Cr. He was seeing Dr Justin Mend, last note in [place] was 06/2012.    2. Dysphagia / inability to keep down solid foods - pt could have an obstruction or motility D/O. GI has seen pt and will do both EGD and colon in AM.    3. Gout - pt's uric acid is not as low as would prefer on allopurinol. Both meds will be stopped. If he has future gout attacks will need to see rheum for alternative agents.   Luke Crews, MD 8/27/20154:06 PM

## 2014-01-15 NOTE — Progress Notes (Signed)
INITIAL NUTRITION ASSESSMENT  DOCUMENTATION CODES Per approved criteria  -Obesity Unspecified   INTERVENTION: - Nepro shakes TID - Encouraged increased meal intake - RD to continue to monitor   NUTRITION DIAGNOSIS: Inadequate oral intake related to poor appetite as evidenced by <25% meal intake.   Goal: Pt to consume >90% of meals/supplements  Monitor:  Weights, labs, intake  Reason for Assessment: Malnutrition screening tool   63 y.o. male  Admitting Dx: Acute renal failure  ASSESSMENT: Pt with hx of CKD IV, HTN, gout presents with AKI, hyperkalemia, and abdominal tenderness. He also had an abdominal xray showing a possible pericolic mass. He had been constipated for last 3 weeks without any bowel movement. He throws up whatever he eats. But he can tolerate water intake .Per chart review, the patient has also lost ~20 lbs over the last year and about 12 lbs since 05/2013. The patient states his appetite has not been well for the past 1 month.  - Friend in room reports pt's weight down 5 pounds in the past 3 months, confirmed by pt - Spoke to pt via interpreter phones however there was some static in phone line - Pt reported he really doesn't like to eat much of anything recently - Only had a few bites of breakfast this morning - C/o itching in right arm and lower back, RN made aware - Denies any nausea/vomiting today - Agreeable to trying Nepro shakes, will order - Does not appear malnourished   Height: Ht Readings from Last 1 Encounters:  01/14/14 5\' 3"  (1.6 m)    Weight: Wt Readings from Last 1 Encounters:  01/15/14 179 lb 0.2 oz (81.2 kg)    Ideal Body Weight: 124 lbs  % Ideal Body Weight: 69%  Wt Readings from Last 10 Encounters:  01/15/14 179 lb 0.2 oz (81.2 kg)  01/13/14 182 lb 9.6 oz (82.827 kg)  09/19/13 187 lb 9.6 oz (85.095 kg)  06/05/13 195 lb (88.451 kg)  05/29/13 197 lb 6.4 oz (89.54 kg)  10/02/12 192 lb 9.6 oz (87.363 kg)  08/30/12 199 lb 11.2  oz (90.583 kg)  08/21/12 196 lb 4.8 oz (89.041 kg)  12/20/11 203 lb 14.4 oz (92.488 kg)  02/01/11 195 lb (88.451 kg)    Usual Body Weight: 184 lbs per pt  % Usual Body Weight: 97%  BMI:  Body mass index is 31.72 kg/(m^2). Class I obesity   Estimated Nutritional Needs: Kcal: 1400-1600 Protein: 55-70g Fluid: per MD  Skin: intact  Diet Order: Renal  EDUCATION NEEDS: -No education needs identified at this time   Intake/Output Summary (Last 24 hours) at 01/15/14 1344 Last data filed at 01/15/14 1100  Gross per 24 hour  Intake 1476.66 ml  Output    300 ml  Net 1176.66 ml    Last BM: 8/26  Labs:   Recent Labs Lab 01/13/14 1639 01/14/14 1406 01/14/14 2214 01/15/14 0423 01/15/14 0900  NA 140 143  --  139  --   K 6.5* 6.0* 5.5* 5.4* 5.9*  CL 115* 111  --  111  --   CO2 18* 17*  --  13*  --   BUN 71* 71*  --  67*  --   CREATININE 4.91* 5.02*  --  4.31*  --   CALCIUM 9.1 9.4  --  8.9  --   GLUCOSE 82 95  --  83  --     CBG (last 3)  No results found for this basename: GLUCAP,  in  the last 72 hours  Scheduled Meds: . dextrose  1 ampule Intravenous Once  . heparin  5,000 Units Subcutaneous 3 times per day  . insulin aspart  10 Units Intravenous Once  . labetalol  200 mg Oral BID  . senna-docusate  1 tablet Oral Daily  . sodium chloride  3 mL Intravenous Q12H    Continuous Infusions: . sodium chloride 100 mL/hr at 01/15/14 [id number]    Past Medical History  Diagnosis Date  . Gout   . CKD (chronic kidney disease) stage 4, GFR 15-29 ml/min     Followed by [facility].  Thought to be due to long-standing HTN  . Essential hypertension   . Gout 05/04/2009    Qualifier: Diagnosis of  By: Dyann Kief MD, Clifton James      Past Surgical History  Procedure Laterality Date  . No past surgeries      Carlis Stable MS, RD, LDN [phone] Pager [phone] Weekend/After Hours Pager

## 2014-01-15 NOTE — Consult Note (Signed)
Unassigned Consult  Reason for Consult: ABM pain and vomiting Referring Physician: Teaching Service  Luke Dunn HPI: This is a 63 year old male with a PMH of CKD, gout, and HTN admitted for hyperkalemia and a worsening of his renal disease.  He was treated with Kayelxalate and IV hydrations.  The patient also reported issues with abdominal pain, solid food vomiting, and weight loss of 20 lbs over past year.  Imaging was performed and it was negative for any mass on the CT scan, but he does have a hernia.  Additionally he reports having issues with constipation and he is unable to have a bowel movement for the past 3 weeks.  The patient does not speak English and the information gathered was from the chart.  Past Medical History  Diagnosis Date  . Gout   . CKD (chronic kidney disease) stage 4, GFR 15-29 ml/min     Followed by [facility].  Thought to be due to long-standing HTN  . Essential hypertension   . Gout 05/04/2009    Qualifier: Diagnosis of  By: Dyann Kief MD, Clifton James      Past Surgical History  Procedure Laterality Date  . No past surgeries      History reviewed. No pertinent family history.  Social History:  reports that he quit smoking about 6 years ago. He quit smokeless tobacco use about 6 years ago. He reports that he does not drink alcohol or use illicit drugs.  Allergies: No Known Allergies  Medications:  Scheduled: . dextrose  1 ampule Intravenous Once  . heparin  5,000 Units Subcutaneous 3 times per day  . insulin aspart  10 Units Intravenous Once  . labetalol  200 mg Oral BID  . senna-docusate  1 tablet Oral Daily  . sodium chloride  3 mL Intravenous Q12H   Continuous: . sodium chloride 100 mL/hr at 01/15/14 [id number]    Results for orders placed during the hospital encounter of 01/14/14 (from the past 24 hour(s))  HEPATITIS PANEL, ACUTE     Status: None   Collection Time    01/14/14  8:39 PM      Result Value Ref Range   Hepatitis B Surface Ag NEGATIVE   NEGATIVE   HCV Ab NEGATIVE  NEGATIVE   Hep A IgM NON REACTIVE  NON REACTIVE   Hep B C IgM NON REACTIVE  NON REACTIVE  URINALYSIS, ROUTINE W REFLEX MICROSCOPIC     Status: None   Collection Time    01/14/14  9:18 PM      Result Value Ref Range   Color, Urine YELLOW  YELLOW   APPearance CLEAR  CLEAR   Specific Gravity, Urine 1.011  1.005 - 1.030   pH 5.5  5.0 - 8.0   Glucose, UA NEGATIVE  NEGATIVE mg/dL   Hgb urine dipstick NEGATIVE  NEGATIVE   Bilirubin Urine NEGATIVE  NEGATIVE   Ketones, ur NEGATIVE  NEGATIVE mg/dL   Protein, ur NEGATIVE  NEGATIVE mg/dL   Urobilinogen, UA 0.2  0.0 - 1.0 mg/dL   Nitrite NEGATIVE  NEGATIVE   Leukocytes, UA NEGATIVE  NEGATIVE  SODIUM, URINE, RANDOM     Status: None   Collection Time    01/14/14  9:18 PM      Result Value Ref Range   Sodium, Ur 78    CREATININE, URINE, RANDOM     Status: None   Collection Time    01/14/14  9:18 PM      Result Value Ref  Range   Creatinine, Urine 91.70    POTASSIUM     Status: Abnormal   Collection Time    01/14/14 10:14 PM      Result Value Ref Range   Potassium 5.5 (*) 3.7 - 5.3 mEq/L  COMPREHENSIVE METABOLIC PANEL     Status: Abnormal   Collection Time    01/15/14  4:23 AM      Result Value Ref Range   Sodium 139  137 - 147 mEq/L   Potassium 5.4 (*) 3.7 - 5.3 mEq/L   Chloride 111  96 - 112 mEq/L   CO2 13 (*) 19 - 32 mEq/L   Glucose, Bld 83  70 - 99 mg/dL   BUN 67 (*) 6 - 23 mg/dL   Creatinine, Ser 4.31 (*) 0.50 - 1.35 mg/dL   Calcium 8.9  8.4 - 10.5 mg/dL   Total Protein 7.4  6.0 - 8.3 g/dL   Albumin 4.0  3.5 - 5.2 g/dL   AST 17  0 - 37 U/L   ALT 7  0 - 53 U/L   Alkaline Phosphatase 76  39 - 117 U/L   Total Bilirubin 0.3  0.3 - 1.2 mg/dL   GFR calc non Af Amer 13 (*) >90 mL/min   GFR calc Af Amer 15 (*) >90 mL/min   Anion gap 15  5 - 15  CBC     Status: Abnormal   Collection Time    01/15/14  4:23 AM      Result Value Ref Range   WBC 4.0  4.0 - 10.5 K/uL   RBC 3.54 (*) 4.22 - 5.81 MIL/uL    Hemoglobin 9.7 (*) 13.0 - 17.0 g/dL   HCT 28.7 (*) 39.0 - 52.0 %   MCV 81.1  78.0 - 100.0 fL   MCH 27.4  26.0 - 34.0 pg   MCHC 33.8  30.0 - 36.0 g/dL   RDW 14.9  11.5 - 15.5 %   Platelets 94 (*) 150 - 400 K/uL  POTASSIUM     Status: Abnormal   Collection Time    01/15/14  9:00 AM      Result Value Ref Range   Potassium 5.9 (*) 3.7 - 5.3 mEq/L     Ct Abdomen Pelvis Wo Contrast  01/14/2014   CLINICAL DATA:  Abdominal mass.  Pain.  EXAM: CT ABDOMEN AND PELVIS WITHOUT CONTRAST  TECHNIQUE: Multidetector CT imaging of the abdomen and pelvis was performed following the standard protocol without IV contrast.  COMPARISON:  Abdominal radiographs 01/13/2014  FINDINGS: Lung bases are clear. 2.3 cm diameter circumscribed low-attenuation lesion in the in the segment 6 of the liver, likely representing a cyst. Unenhanced appearance of the gallbladder, spleen, pancreas, adrenal glands, abdominal aorta, inferior vena cava, and retroperitoneal lymph nodes is unremarkable. Diffuse parenchymal atrophy in the kidneys. Sub cm exophytic lesion in the right kidney upper pole probably represents a cyst. Stomach, small bowel, and colon are unremarkable. Prominent visceral adipose tissues. No free air or free fluid in the abdomen. There is a midline abdominal wall hernia at the umbilicus containing fat. Appearance of mild bulging of the flank muscles inferior to the rib cage without discrete herniation. This appearance accounts for the soft tissue prominence in the right flank on prior plain film. No discrete mass is identified.  Pelvis: Prostate gland is not enlarged. Bladder wall is not thickened. No free or loculated pelvic fluid collections. No pelvic mass or lymphadenopathy. Small amount of fat in the left  inguinal region. Degenerative changes throughout the spine. No destructive bone lesions appreciated. Reversal of the usual lumbar lordosis is likely degenerative.  IMPRESSION: Prominent visceral adipose tissues with  abdominal protrusion under the ribcage appears to cause the soft tissue shadowing seen on previous plain film. No discrete mass or hematoma is demonstrated. There is a moderate-sized umbilical hernia containing fat. Renal atrophy. Hepatic and renal cysts.   Electronically Signed   By: Lucienne Capers M.D.   On: 01/14/2014 22:10   Dg Abd 1 View  01/14/2014   CLINICAL DATA:  Constipation.  Nausea.  EXAM: ABDOMEN - 1 VIEW  COMPARISON:  None.  FINDINGS: Soft tissue prominence noted over the right flank. This could just represent prominent abdominal wall musculature however a right flank/abdominal sidewall/ right pericolic mass cannot be excluded. Minimal thickening of the left colonic wall cannot be excluded. IV contrast enhanced CT abdomen and pelvis suggested to further evaluate these findings. The gas pattern is nonspecific. No large stool volume noted to suggest constipation. Moderate amount of stool in the right colon. No free air. Mild left base subsegmental atelectasis. Thoracolumbar spine ankylosis consistent with ankylosing spondylitis. Degenerative changes both hips. No evidence of sacroiliitis. No acute bony abnormality. Sclerotic density noted in the left femoral neck. No other sclerotic densities are noted. This is most likely a bone island.  IMPRESSION: 1. Ill-defined soft tissue prominence noted over the right flank. This could represent prominent abdominal wall musculature however right flank/abdominal sidewall/right pericolic mass cannot be excluded. 2. Questionable mild left colonic wall thickening. IV contrast-enhanced CT of the abdomen pelvis suggested for further evaluation of the right flank and left colon. 3. Ankylosing spondylitis thoracolumbar spine. 4. Left base subsegmental atelectasis.   Electronically Signed   By: Marcello Moores  Register   On: 01/14/2014 08:36    ROS:  As stated above in the HPI otherwise negative.  Blood pressure 118/66, pulse 90, temperature 98.2 F (36.8 C),  temperature source Oral, resp. rate 18, height 5\' 3"  (1.6 m), weight 179 lb 0.2 oz (81.2 kg), SpO2 97.00%.    PE: Gen: NAD HEENT:  [place]/AT, EOMI Neck: Supple, no LAD Lungs: CTA Bilaterally CV: RRR without M/G/R ABM: Soft, NTND, +BS Ext: No C/C/E  Assessment/Plan: 1) ABM pain. 2) Weight loss. 3) Constipation.   With his vomiting and weight loss, I will perform an EGD for further evaluation.  There was no evidence of GOO on the CT scan.  Additionally, I will perform a colonoscopy.  This procedure will be, at the very least, a screening examination.  Plan: 1) EGD/Colon tomorrow.  Paislie Tessler D 01/15/2014, 1:03 PM

## 2014-01-15 NOTE — Progress Notes (Signed)
Subjective:  Feels fine. Had some loose stool. Has some abdominal pain still. Has been NPO.   Confirms that he had no BM 3 weeks. every time he eats solids he throws up right away. He tolerates liquids.   Objective: Vital signs in last 24 hours: Filed Vitals:   01/14/14 2101 01/15/14 0515 01/15/14 0900 01/15/14 1442  BP: 140/89 96/66 118/66 124/70  Pulse: 66 79 90 77  Temp: 97.5 F (36.4 C) 98.2 F (36.8 C)  98 F (36.7 C)  TempSrc: Oral Oral  Oral  Resp: 18 18  18   Height:      Weight:  81.2 kg (179 lb 0.2 oz)    SpO2: 100% 97%  99%   Weight change:   Intake/Output Summary (Last 24 hours) at 01/15/14 1646 Last data filed at 01/15/14 1632  Gross per 24 hour  Intake 2649.99 ml  Output    300 ml  Net 2349.99 ml   Vitals reviewed. General: resting in bed, NAD. HEENT: PERRL, EOMI, no scleral icterus Cardiac: RRR, no rubs, murmurs or gallops Pulm: clear to auscultation bilaterally, no wheezes, rales, or rhonchi Abd: soft, TTP diffusely. No epigastric tenderness. Umbilical hernia present that reducible.  Ext: warm and well perfused, no pedal edema.  Neuro: alert and oriented X3, cranial nerves II-XII grossly intact, strength and sensation to light touch equal in bilateral upper and lower extremities  Lab Results: Basic Metabolic Panel:  Recent Labs Lab 01/14/14 1406  01/15/14 0423 01/15/14 0900 01/15/14 1402  NA 143  --  139  --   --   K 6.0*  < > 5.4* 5.9* 6.4*  CL 111  --  111  --   --   CO2 17*  --  13*  --   --   GLUCOSE 95  --  83  --   --   BUN 71*  --  67*  --   --   CREATININE 5.02*  --  4.31*  --   --   CALCIUM 9.4  --  8.9  --   --   < > = values in this interval not displayed. Liver Function Tests:  Recent Labs Lab 01/14/14 1406 01/15/14 0423  AST 19 17  ALT 7 7  ALKPHOS 75 76  BILITOT 0.3 0.3  PROT 7.9 7.4  ALBUMIN 4.1 4.0   No results found for this basename: LIPASE, AMYLASE,  in the last 168 hours No results found for this basename:  AMMONIA,  in the last 168 hours CBC:  Recent Labs Lab 01/13/14 1639 01/15/14 0423  WBC 4.8 4.0  NEUTROABS 2.7  --   HGB 9.9* 9.7*  HCT 30.1* 28.7*  MCV 81.1 81.1  PLT 137* 94*   Cardiac Enzymes: No results found for this basename: CKTOTAL, CKMB, CKMBINDEX, TROPONINI,  in the last 168 hours BNP: No results found for this basename: PROBNP,  in the last 168 hours D-Dimer: No results found for this basename: DDIMER,  in the last 168 hours CBG: No results found for this basename: GLUCAP,  in the last 168 hours Hemoglobin A1C: No results found for this basename: HGBA1C,  in the last 168 hours Fasting Lipid Panel: No results found for this basename: CHOL, HDL, LDLCALC, TRIG, CHOLHDL, LDLDIRECT,  in the last 168 hours Thyroid Function Tests: No results found for this basename: TSH, T4TOTAL, FREET4, T3FREE, THYROIDAB,  in the last 168 hours Coagulation: No results found for this basename: LABPROT, INR,  in the last  168 hours Anemia Panel: No results found for this basename: VITAMINB12, FOLATE, FERRITIN, TIBC, IRON, RETICCTPCT,  in the last 168 hours Urine Drug Screen: Drugs of Abuse  No results found for this basename: labopia, cocainscrnur, labbenz, amphetmu, thcu, labbarb    Alcohol Level: No results found for this basename: ETH,  in the last 168 hours Urinalysis:  Recent Labs Lab 01/14/14 2118 01/15/14 1258  COLORURINE YELLOW YELLOW  LABSPEC 1.011 1.010  PHURINE 5.5 5.5  GLUCOSEU NEGATIVE NEGATIVE  HGBUR NEGATIVE NEGATIVE  BILIRUBINUR NEGATIVE NEGATIVE  KETONESUR NEGATIVE NEGATIVE  PROTEINUR NEGATIVE NEGATIVE  UROBILINOGEN 0.2 0.2  NITRITE NEGATIVE NEGATIVE  LEUKOCYTESUR NEGATIVE NEGATIVE   Misc. Labs:  Micro Results: No results found for this or any previous visit (from the past 240 hour(s)). Studies/Results: Ct Abdomen Pelvis Wo Contrast  01/14/2014   CLINICAL DATA:  Abdominal mass.  Pain.  EXAM: CT ABDOMEN AND PELVIS WITHOUT CONTRAST  TECHNIQUE:  Multidetector CT imaging of the abdomen and pelvis was performed following the standard protocol without IV contrast.  COMPARISON:  Abdominal radiographs 01/13/2014  FINDINGS: Lung bases are clear. 2.3 cm diameter circumscribed low-attenuation lesion in the in the segment 6 of the liver, likely representing a cyst. Unenhanced appearance of the gallbladder, spleen, pancreas, adrenal glands, abdominal aorta, inferior vena cava, and retroperitoneal lymph nodes is unremarkable. Diffuse parenchymal atrophy in the kidneys. Sub cm exophytic lesion in the right kidney upper pole probably represents a cyst. Stomach, small bowel, and colon are unremarkable. Prominent visceral adipose tissues. No free air or free fluid in the abdomen. There is a midline abdominal wall hernia at the umbilicus containing fat. Appearance of mild bulging of the flank muscles inferior to the rib cage without discrete herniation. This appearance accounts for the soft tissue prominence in the right flank on prior plain film. No discrete mass is identified.  Pelvis: Prostate gland is not enlarged. Bladder wall is not thickened. No free or loculated pelvic fluid collections. No pelvic mass or lymphadenopathy. Small amount of fat in the left inguinal region. Degenerative changes throughout the spine. No destructive bone lesions appreciated. Reversal of the usual lumbar lordosis is likely degenerative.  IMPRESSION: Prominent visceral adipose tissues with abdominal protrusion under the ribcage appears to cause the soft tissue shadowing seen on previous plain film. No discrete mass or hematoma is demonstrated. There is a moderate-sized umbilical hernia containing fat. Renal atrophy. Hepatic and renal cysts.   Electronically Signed   By: Lucienne Capers M.D.   On: 01/14/2014 22:10   Dg Abd 1 View  01/14/2014   CLINICAL DATA:  Constipation.  Nausea.  EXAM: ABDOMEN - 1 VIEW  COMPARISON:  None.  FINDINGS: Soft tissue prominence noted over the right flank.  This could just represent prominent abdominal wall musculature however a right flank/abdominal sidewall/ right pericolic mass cannot be excluded. Minimal thickening of the left colonic wall cannot be excluded. IV contrast enhanced CT abdomen and pelvis suggested to further evaluate these findings. The gas pattern is nonspecific. No large stool volume noted to suggest constipation. Moderate amount of stool in the right colon. No free air. Mild left base subsegmental atelectasis. Thoracolumbar spine ankylosis consistent with ankylosing spondylitis. Degenerative changes both hips. No evidence of sacroiliitis. No acute bony abnormality. Sclerotic density noted in the left femoral neck. No other sclerotic densities are noted. This is most likely a bone island.  IMPRESSION: 1. Ill-defined soft tissue prominence noted over the right flank. This could represent prominent abdominal wall musculature however right  flank/abdominal sidewall/right pericolic mass cannot be excluded. 2. Questionable mild left colonic wall thickening. IV contrast-enhanced CT of the abdomen pelvis suggested for further evaluation of the right flank and left colon. 3. Ankylosing spondylitis thoracolumbar spine. 4. Left base subsegmental atelectasis.   Electronically Signed   By: Marcello Moores  Register   On: 01/14/2014 08:36   Medications: I have reviewed the patient's current medications. Scheduled Meds: . albuterol  10 mg Nebulization Once  . dextrose  1 ampule Intravenous Once  . feeding supplement (NEPRO CARB STEADY)  237 mL Oral TID WC  . heparin  5,000 Units Subcutaneous 3 times per day  . insulin aspart  10 Units Intravenous Once  . labetalol  200 mg Oral BID  . polyethylene glycol-electrolytes  4,000 mL Oral Once  . senna-docusate  1 tablet Oral Daily  . sodium chloride  3 mL Intravenous Q12H   Continuous Infusions: . sodium chloride 100 mL/hr at 01/15/14 0623  . sodium chloride 100 mL (01/15/14 1632)   PRN Meds:.acetaminophen,  acetaminophen, albuterol, diphenhydrAMINE, morphine injection, promethazine Assessment/Plan: Principal Problem:   Acute renal failure Active Problems:   Essential hypertension, benign   Microcytic anemia   Constipation   Hyperkalemia  39 y man [place] with PMH of HTN, presenting with hyperkalemia (K of 6), ARF, Cr of 5.02 from naseline of 2.7, weight loss with decreased appetite. Also having solid food intolerance.  Constipation and inability to keep down solid food- without pain. This could be due to an obstruction somewhere in the upper GI tract since the food comes out right after swallowing. He is tolerating liquids fine. Could be achalasias or a mass in the esophagus. Also concerning as no BM for last 3 weeks  - GI consulted - EGD and colonoscopy tomorrow.  AKi on CKD - improving with IVF. Came in with 5.02. baseline around 2.7 - today improved to 4.31 - continue IVF 100cc/hr  Hyperkalemia - no EKG changes. K+ 6.5 yesterday. s/p kayexalate. Had BM yesterday but K+ remains high - continue q4hr K+ checks. Kayexalate PRN. - will receive bowel prep for cscopy which should cause some more BM and hopefully bring K+ down.  Gout - on allopurinol but uric acid remains high 10.7. Will stop allopurinol since it can cause AKI.  Pruritis - benadryl. No rash.  Dispo: Disposition is deferred at this time, awaiting improvement of current medical problems.  Anticipated discharge in approximately 2-3 day(s).   The patient does have a current PCP Corky Sox, MD) and does need an [facility] hospital follow-up appointment after discharge.  The patient does have transportation limitations that hinder transportation to clinic appointments.  .Services Needed at time of discharge: Y = Yes, Blank = No PT:   OT:   RN:   Equipment:   Other:     LOS: 1 day   Dellia Nims, MD 01/15/2014, 4:46 PM

## 2014-01-15 NOTE — Care Management Note (Signed)
    Page 1 of 1   01/15/2014     11:12:00 AM CARE MANAGEMENT NOTE 01/15/2014  Patient:  Luke Dunn, Luke Dunn   Account Number:  [id number]  Date Initiated:  01/15/2014  Documentation initiated by:  Tomi Bamberger  Subjective/Objective Assessment:   dx renal failure, hyperkalemia  admit- from home.     Action/Plan:   Anticipated DC Date:  01/17/2014   Anticipated DC Plan:  [place]  CM consult      Choice offered to / List presented to:             Status of service:  In process, will continue to follow Medicare Important Message given?   (If response is "NO", the following Medicare IM given date fields will be blank) Date Medicare IM given:   Medicare IM given by:   Date Additional Medicare IM given:   Additional Medicare IM given by:    Discharge Disposition:    Per UR Regulation:  Reviewed for med. necessity/level of care/duration of stay  If discussed at [place] of Stay Meetings, dates discussed:    Comments:  01/15/14 [place], BSN [phone] patient is from home, NCM will continue to follow for dc needs.

## 2014-01-15 NOTE — Progress Notes (Signed)
Internal Medicine Clinic Attending Date of visit: 01/13/2014   Case discussed with Dr. Ronnald Ramp at the time of the visit.  We reviewed the resident's history and exam and pertinent patient test results.  I agree with the assessment, diagnosis, and plan of care documented in the resident's note.

## 2014-01-16 ENCOUNTER — Encounter (HOSPITAL_COMMUNITY)
Admission: AD | Disposition: A | Discharge status: Home / Self Care | Payer: Medicare Other | Source: Ambulatory Visit | Attending: Internal Medicine

## 2014-01-16 ENCOUNTER — Encounter (HOSPITAL_COMMUNITY): Payer: Self-pay | Admitting: *Deleted

## 2014-01-16 DIAGNOSIS — E875 Hyperkalemia: Secondary | ICD-10-CM

## 2014-01-16 DIAGNOSIS — L299 Pruritus, unspecified: Secondary | ICD-10-CM

## 2014-01-16 HISTORY — PX: ESOPHAGOGASTRODUODENOSCOPY: SHX5428

## 2014-01-16 HISTORY — PX: COLONOSCOPY: SHX5424

## 2014-01-16 LAB — COMPREHENSIVE METABOLIC PANEL
ALBUMIN: 4 g/dL (ref 3.5–5.2)
ALK PHOS: 75 U/L (ref 39–117)
ALT: 8 U/L (ref 0–53)
AST: 19 U/L (ref 0–37)
Anion gap: 12 (ref 5–15)
BUN: 54 mg/dL — AB (ref 6–23)
CHLORIDE: 119 meq/L — AB (ref 96–112)
CO2: 13 mEq/L — ABNORMAL LOW (ref 19–32)
Calcium: 8.7 mg/dL (ref 8.4–10.5)
Creatinine, Ser: 3.47 mg/dL — ABNORMAL HIGH (ref 0.50–1.35)
GFR calc Af Amer: 20 mL/min — ABNORMAL LOW (ref >90)
GFR, EST NON AFRICAN AMERICAN: 17 mL/min — AB (ref >90)
Glucose, Bld: 88 mg/dL (ref 70–99)
POTASSIUM: 5.6 meq/L — AB (ref 3.7–5.3)
Sodium: 144 mEq/L (ref 137–147)
Total Bilirubin: 0.2 mg/dL — ABNORMAL LOW (ref 0.3–1.2)
Total Protein: 7.5 g/dL (ref 6.0–8.3)

## 2014-01-16 LAB — POTASSIUM
POTASSIUM: 5.7 meq/L — AB (ref 3.7–5.3)
Potassium: 6.6 mEq/L (ref 3.7–5.3)
Potassium: 5.3 mEq/L (ref 3.7–5.3)
Potassium: 5.6 mEq/L — ABNORMAL HIGH (ref 3.7–5.3)

## 2014-01-16 LAB — CHLORIDE, URINE, RANDOM: CHLORIDE URINE: 59 meq/L

## 2014-01-16 LAB — NA AND K (SODIUM & POTASSIUM), RAND UR
Potassium Urine: 10 mEq/L
Sodium, Ur: 76 mEq/L

## 2014-01-16 LAB — HIV ANTIBODY (ROUTINE TESTING W REFLEX): HIV 1&2 Ab, 4th Generation: NONREACTIVE

## 2014-01-16 SURGERY — EGD (ESOPHAGOGASTRODUODENOSCOPY)
Anesthesia: Moderate Sedation

## 2014-01-16 MED ORDER — PANTOPRAZOLE SODIUM 40 MG PO TBEC
40.0000 mg | DELAYED_RELEASE_TABLET | Freq: Two times a day (BID) | ORAL | Status: DC
  Administered 2014-01-16 – 2014-01-18 (×5): 40 mg via ORAL
  Filled 2014-01-16 (×5): qty 1

## 2014-01-16 MED ORDER — MIDAZOLAM HCL 10 MG/2ML IJ SOLN
INTRAMUSCULAR | Status: DC | PRN
  Administered 2014-01-16: 2 mg via INTRAVENOUS
  Administered 2014-01-16: 1 mg via INTRAVENOUS
  Administered 2014-01-16: 2 mg via INTRAVENOUS

## 2014-01-16 MED ORDER — DIPHENHYDRAMINE HCL 50 MG/ML IJ SOLN
INTRAMUSCULAR | Status: AC
  Filled 2014-01-16: qty 1

## 2014-01-16 MED ORDER — SODIUM POLYSTYRENE SULFONATE 15 GM/60ML PO SUSP
30.0000 g | Freq: Once | ORAL | Status: AC
  Administered 2014-01-16: 30 g via ORAL
  Filled 2014-01-16: qty 120

## 2014-01-16 MED ORDER — FENTANYL CITRATE 0.05 MG/ML IJ SOLN
INTRAMUSCULAR | Status: AC
  Filled 2014-01-16: qty 2

## 2014-01-16 MED ORDER — MIDAZOLAM HCL 5 MG/ML IJ SOLN
INTRAMUSCULAR | Status: AC
  Filled 2014-01-16: qty 2

## 2014-01-16 MED ORDER — SODIUM POLYSTYRENE SULFONATE 15 GM/60ML PO SUSP: 30.0000 g | Freq: Once | ORAL | Status: DC

## 2014-01-16 MED ORDER — SODIUM CHLORIDE 0.9 % IV SOLN
Freq: Once | INTRAVENOUS | Status: AC
  Administered 2014-01-16: 500 mL via INTRAVENOUS

## 2014-01-16 MED ORDER — SODIUM POLYSTYRENE SULFONATE 15 GM/60ML PO SUSP
60.0000 g | Freq: Once | ORAL | Status: AC
  Administered 2014-01-16: 60 g via ORAL
  Filled 2014-01-16: qty 240

## 2014-01-16 MED ORDER — FENTANYL CITRATE 0.05 MG/ML IJ SOLN
INTRAMUSCULAR | Status: DC | PRN
  Administered 2014-01-16 (×3): 25 ug via INTRAVENOUS

## 2014-01-16 MED ORDER — SODIUM BICARBONATE 650 MG PO TABS
650.0000 mg | ORAL_TABLET | Freq: Three times a day (TID) | ORAL | Status: DC
  Administered 2014-01-16 – 2014-01-18 (×6): 650 mg via ORAL
  Filled 2014-01-16 (×8): qty 1

## 2014-01-16 NOTE — Op Note (Signed)
[facility] [place], [id number]   OPERATIVE PROCEDURE REPORT  PATIENT: Luke Dunn, Luke Dunn  MR#: [id number] BIRTHDATE: February 07, 1951  GENDER: Male ENDOSCOPIST: Carol Ada, MD ASSISTANT:   Tedra Coupe, Carolynn Comment, technician PROCEDURE DATE: 01/16/2014 PROCEDURE:   Colonoscopy with snare polypectomy ASA CLASS:   Class III INDICATIONS:ABM pain. MEDICATIONS: See the EGD report.  DESCRIPTION OF PROCEDURE:   After the risks benefits and alternatives of the procedure were thoroughly explained, informed consent was obtained.  A digital rectal exam revealed no abnormalities of the rectum.    The Pentax Ped Colon [id number] endoscope was introduced through the anus  and advanced to the cecum, which was identified by both the appendix and ileocecal valve , No adverse events experienced.    The quality of the prep was excellent. .  The instrument was then slowly withdrawn as the colon was fully examined.     FINDINGS: Scattered left sided diverticula were identified.  Two 5-7 mm sessile and semi-pedunculated sigmoid colon polyps were removed with a hot snare.  No evidence of any masses, inflammation, ulcerations, erosions, or vascular abnormalities.          The scope was then withdrawn from the patient and the procedure terminated.  COMPLICATIONS: There were no complications.  IMPRESSION: 1) Colonic polyps. 2) Left sided diverticula.  RECOMMENDATIONS: 1) Follow up biopsy results. 2) Repeat the colonoscopy in 5 years. 3) Signing off.  _______________________________ Lorrin MaisCarol Ada, MD 01/16/2014 11:36 AM

## 2014-01-16 NOTE — Interval H&P Note (Signed)
History and Physical Interval Note:  01/16/2014 10:45 AM  Luke Dunn  has presented today for surgery, with the diagnosis of Weight loss, vomiting, ABM pain  The various methods of treatment have been discussed with the patient and family. After consideration of risks, benefits and other options for treatment, the patient has consented to  Procedure(s): ESOPHAGOGASTRODUODENOSCOPY (EGD) (N/A) COLONOSCOPY (N/A) as a surgical intervention .  The patient's history has been reviewed, patient examined, no change in status, stable for surgery.  I have reviewed the patient's chart and labs.  Questions were answered to the patient's satisfaction.     Marni Franzoni D

## 2014-01-16 NOTE — H&P (View-Only) (Signed)
Unassigned Consult  Reason for Consult: ABM pain and vomiting Referring Physician: Teaching Service  [name] HPI: This is a 63 year old male with a PMH of CKD, gout, and HTN admitted for hyperkalemia and a worsening of his renal disease.  He was treated with Kayelxalate and IV hydrations.  The patient also reported issues with abdominal pain, solid food vomiting, and weight loss of 20 lbs over past year.  Imaging was performed and it was negative for any mass on the CT scan, but he does have a hernia.  Additionally he reports having issues with constipation and he is unable to have a bowel movement for the past 3 weeks.  The patient does not speak English and the information gathered was from the chart.  Past Medical History  Diagnosis Date  . Gout   . CKD (chronic kidney disease) stage 4, GFR 15-29 ml/min     Followed by [facility].  Thought to be due to long-standing HTN  . Essential hypertension   . Gout 05/04/2009    Qualifier: Diagnosis of  By: Dyann Kief MD, Clifton James      Past Surgical History  Procedure Laterality Date  . No past surgeries      History reviewed. No pertinent family history.  Social History:  reports that he quit smoking about 6 years ago. He quit smokeless tobacco use about 6 years ago. He reports that he does not drink alcohol or use illicit drugs.  Allergies: No Known Allergies  Medications:  Scheduled: . dextrose  1 ampule Intravenous Once  . heparin  5,000 Units Subcutaneous 3 times per day  . insulin aspart  10 Units Intravenous Once  . labetalol  200 mg Oral BID  . senna-docusate  1 tablet Oral Daily  . sodium chloride  3 mL Intravenous Q12H   Continuous: . sodium chloride 100 mL/hr at 01/15/14 [id number]    Results for orders placed during the hospital encounter of 01/14/14 (from the past 24 hour(s))  HEPATITIS PANEL, ACUTE     Status: None   Collection Time    01/14/14  8:39 PM      Result Value Ref Range   Hepatitis B Surface Ag NEGATIVE   NEGATIVE   HCV Ab NEGATIVE  NEGATIVE   Hep A IgM NON REACTIVE  NON REACTIVE   Hep B C IgM NON REACTIVE  NON REACTIVE  URINALYSIS, ROUTINE W REFLEX MICROSCOPIC     Status: None   Collection Time    01/14/14  9:18 PM      Result Value Ref Range   Color, Urine YELLOW  YELLOW   APPearance CLEAR  CLEAR   Specific Gravity, Urine 1.011  1.005 - 1.030   pH 5.5  5.0 - 8.0   Glucose, UA NEGATIVE  NEGATIVE mg/dL   Hgb urine dipstick NEGATIVE  NEGATIVE   Bilirubin Urine NEGATIVE  NEGATIVE   Ketones, ur NEGATIVE  NEGATIVE mg/dL   Protein, ur NEGATIVE  NEGATIVE mg/dL   Urobilinogen, UA 0.2  0.0 - 1.0 mg/dL   Nitrite NEGATIVE  NEGATIVE   Leukocytes, UA NEGATIVE  NEGATIVE  SODIUM, URINE, RANDOM     Status: None   Collection Time    01/14/14  9:18 PM      Result Value Ref Range   Sodium, Ur 78    CREATININE, URINE, RANDOM     Status: None   Collection Time    01/14/14  9:18 PM      Result Value Ref  Range   Creatinine, Urine 91.70    POTASSIUM     Status: Abnormal   Collection Time    01/14/14 10:14 PM      Result Value Ref Range   Potassium 5.5 (*) 3.7 - 5.3 mEq/L  COMPREHENSIVE METABOLIC PANEL     Status: Abnormal   Collection Time    01/15/14  4:23 AM      Result Value Ref Range   Sodium 139  137 - 147 mEq/L   Potassium 5.4 (*) 3.7 - 5.3 mEq/L   Chloride 111  96 - 112 mEq/L   CO2 13 (*) 19 - 32 mEq/L   Glucose, Bld 83  70 - 99 mg/dL   BUN 67 (*) 6 - 23 mg/dL   Creatinine, Ser 4.31 (*) 0.50 - 1.35 mg/dL   Calcium 8.9  8.4 - 10.5 mg/dL   Total Protein 7.4  6.0 - 8.3 g/dL   Albumin 4.0  3.5 - 5.2 g/dL   AST 17  0 - 37 U/L   ALT 7  0 - 53 U/L   Alkaline Phosphatase 76  39 - 117 U/L   Total Bilirubin 0.3  0.3 - 1.2 mg/dL   GFR calc non Af Amer 13 (*) >90 mL/min   GFR calc Af Amer 15 (*) >90 mL/min   Anion gap 15  5 - 15  CBC     Status: Abnormal   Collection Time    01/15/14  4:23 AM      Result Value Ref Range   WBC 4.0  4.0 - 10.5 K/uL   RBC 3.54 (*) 4.22 - 5.81 MIL/uL    Hemoglobin 9.7 (*) 13.0 - 17.0 g/dL   HCT 28.7 (*) 39.0 - 52.0 %   MCV 81.1  78.0 - 100.0 fL   MCH 27.4  26.0 - 34.0 pg   MCHC 33.8  30.0 - 36.0 g/dL   RDW 14.9  11.5 - 15.5 %   Platelets 94 (*) 150 - 400 K/uL  POTASSIUM     Status: Abnormal   Collection Time    01/15/14  9:00 AM      Result Value Ref Range   Potassium 5.9 (*) 3.7 - 5.3 mEq/L     Ct Abdomen Pelvis Wo Contrast  01/14/2014   CLINICAL DATA:  Abdominal mass.  Pain.  EXAM: CT ABDOMEN AND PELVIS WITHOUT CONTRAST  TECHNIQUE: Multidetector CT imaging of the abdomen and pelvis was performed following the standard protocol without IV contrast.  COMPARISON:  Abdominal radiographs 01/13/2014  FINDINGS: Lung bases are clear. 2.3 cm diameter circumscribed low-attenuation lesion in the in the segment 6 of the liver, likely representing a cyst. Unenhanced appearance of the gallbladder, spleen, pancreas, adrenal glands, abdominal aorta, inferior vena cava, and retroperitoneal lymph nodes is unremarkable. Diffuse parenchymal atrophy in the kidneys. Sub cm exophytic lesion in the right kidney upper pole probably represents a cyst. Stomach, small bowel, and colon are unremarkable. Prominent visceral adipose tissues. No free air or free fluid in the abdomen. There is a midline abdominal wall hernia at the umbilicus containing fat. Appearance of mild bulging of the flank muscles inferior to the rib cage without discrete herniation. This appearance accounts for the soft tissue prominence in the right flank on prior plain film. No discrete mass is identified.  Pelvis: Prostate gland is not enlarged. Bladder wall is not thickened. No free or loculated pelvic fluid collections. No pelvic mass or lymphadenopathy. Small amount of fat in the left  inguinal region. Degenerative changes throughout the spine. No destructive bone lesions appreciated. Reversal of the usual lumbar lordosis is likely degenerative.  IMPRESSION: Prominent visceral adipose tissues with  abdominal protrusion under the ribcage appears to cause the soft tissue shadowing seen on previous plain film. No discrete mass or hematoma is demonstrated. There is a moderate-sized umbilical hernia containing fat. Renal atrophy. Hepatic and renal cysts.   Electronically Signed   By: Lucienne Capers M.D.   On: 01/14/2014 22:10   Dg Abd 1 View  01/14/2014   CLINICAL DATA:  Constipation.  Nausea.  EXAM: ABDOMEN - 1 VIEW  COMPARISON:  None.  FINDINGS: Soft tissue prominence noted over the right flank. This could just represent prominent abdominal wall musculature however a right flank/abdominal sidewall/ right pericolic mass cannot be excluded. Minimal thickening of the left colonic wall cannot be excluded. IV contrast enhanced CT abdomen and pelvis suggested to further evaluate these findings. The gas pattern is nonspecific. No large stool volume noted to suggest constipation. Moderate amount of stool in the right colon. No free air. Mild left base subsegmental atelectasis. Thoracolumbar spine ankylosis consistent with ankylosing spondylitis. Degenerative changes both hips. No evidence of sacroiliitis. No acute bony abnormality. Sclerotic density noted in the left femoral neck. No other sclerotic densities are noted. This is most likely a bone island.  IMPRESSION: 1. Ill-defined soft tissue prominence noted over the right flank. This could represent prominent abdominal wall musculature however right flank/abdominal sidewall/right pericolic mass cannot be excluded. 2. Questionable mild left colonic wall thickening. IV contrast-enhanced CT of the abdomen pelvis suggested for further evaluation of the right flank and left colon. 3. Ankylosing spondylitis thoracolumbar spine. 4. Left base subsegmental atelectasis.   Electronically Signed   By: Marcello Moores  Register   On: 01/14/2014 08:36    ROS:  As stated above in the HPI otherwise negative.  Blood pressure 118/66, pulse 90, temperature 98.2 F (36.8 C),  temperature source Oral, resp. rate 18, height 5\' 3"  (1.6 m), weight 179 lb 0.2 oz (81.2 kg), SpO2 97.00%.    PE: Gen: NAD HEENT:  [place]/AT, EOMI Neck: Supple, no LAD Lungs: CTA Bilaterally CV: RRR without M/G/R ABM: Soft, NTND, +BS Ext: No C/C/E  Assessment/Plan: 1) ABM pain. 2) Weight loss. 3) Constipation.   With his vomiting and weight loss, I will perform an EGD for further evaluation.  There was no evidence of GOO on the CT scan.  Additionally, I will perform a colonoscopy.  This procedure will be, at the very least, a screening examination.  Plan: 1) EGD/Colon tomorrow.  Macarthur Lorusso D 01/15/2014, 1:03 PM

## 2014-01-16 NOTE — Progress Notes (Signed)
CRITICAL VALUE ALERT  Critical value received:  K 6.6  Date of notification:  01/16/14  Time of notification:  [id number]  Critical value read back:Yes.    Nurse who received alert:  Rexford Maus  MD notified (1st page):  Dr. Genene Churn  Time of first page:  [phone]  MD notified (2nd page):  Time of second page:  Responding MD:  Dr. Genene Churn  Time MD responded:  [phone]

## 2014-01-16 NOTE — Op Note (Signed)
[facility] [place], [id number]   OPERATIVE PROCEDURE REPORT  PATIENT: Luke Dunn, [name]  MR#: [id number] BIRTHDATE: Feb 21, 1951  GENDER: Male ENDOSCOPIST: Carol Ada, MD ASSISTANT:   Tedra Coupe, and Carolynn Comment, technician PROCEDURE DATE: 01/16/2014 PROCEDURE:   EGD w/ biopsy ASA CLASS:   Class III INDICATIONS:Nausea/Vomiting and Weight loss MEDICATIONS: Versed 5 mg IV and Fentanyl 75 mcg IV TOPICAL ANESTHETIC:   none  DESCRIPTION OF PROCEDURE:   After the risks benefits and alternatives of the procedure were thoroughly explained, informed consent was obtained.  The [id number]  endoscope was introduced through the mouth  and advanced to the second portion of the duodenum Without limitations.      The instrument was slowly withdrawn as the mucosa was fully examined.  FINDINGS: In the distal esohpagus an LA Grade B esohpagitis was identified and there was noted friability.  In fact, some blood was noted before any manipulation of the area.  The Z-line was displaced proximally to 37 cm and there was a gross appearance of Barrett's esophagus.  Biopsies were not performed as he was not on a PPI, which can give a false positve result for dysplasia.  A mild antral gastritis was noted and cold biopsies were obtained.  The duodenal bulb exhibited a mild inflammation.          The scope was then withdrawn from the patient and the procedure terminated.  COMPLICATIONS: There were no complications.  IMPRESSION: 1) LA Grade B esophagitis. 2) Probable Barrett's esophagus. 3) Gastritis. 4) Duodenitis.  RECOMMENDATIONS: 1) PPI BID x 1 month and then QD. 2) Follow up biopsy results. 3) Follow up in the office in 1 month. 4) Repeat EGD in 2-3 months to confirm Barrett's esophagus.  _______________________________ eSignedCarol Ada, MD 01/16/2014 11:32 AM

## 2014-01-16 NOTE — Consult Note (Signed)
Reason for Consult: Hyperkalemia, Acute on Chronic Kidney Disease 4 Referring Physician: IMTS  Luke Dunn is an 63 y.o. male.  HPI: Luke Dunn is a 63 yo [place] male with PMH of CKD4, HTN, gout, anemia.  History obtain through [employer] via telephone and from chart review.  He initially presented to the [facility] outpatient clinic on 8/25 with complaints of itching, constipation, gout, weight loss.  Routine labs were obtained which revealed his SCr had increased to ~5 from 2.8 at last visit.  Additionally he was found to be hyperkalemic with a potassium of 6.5.  He was brought in the following day for a direct admission.  While inpatient his potassium has been treated by IMTS and has been difficult to control while his creatinine has improved to 3.5 today.  GI was consulted and an EGD and colonoscopy was preformed which revealed esophagitis, 2 sessil polyps which were removed and diverticulosis. Of note patient reports he has been aware of his chronic kidney issues for about the last 4 years.  He saw Dr. Justin Mend at [facility] in 2013 and again in early 2014.  He denies any family history of kidney issues and denies any personal history of urinary tract infections or trouble urinating.  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  Unfortunately he did not follow up with Dr. Justin Mend in September of 2014 due to financial concerns.   He reports he has not taken any NSAIDs at home, he does admit taking some sort of herbal supplement but the telephone interpreter was unable to understand why kind or the name.  He does admit to missing some doses of his prescribed medications.  He denies any current issues with urination or dysuria.  He denies any foamy or frothy urine. He did not receive any IV contrast studies while inpatient and his abdominal CT wo contrast did not reveal any evidence of obstruction.  Trend in Creatinine: Creat  Date/Time  Value Ref Range Status  01/14/2014  2:06 PM 5.02* 0.50 - 1.35 mg/dL Final  01/13/2014  4:39 PM 4.91* 0.50 - 1.35 mg/dL Final  09/19/2013  4:47 PM 2.76* 0.50 - 1.35 mg/dL Final  06/05/2013  2:48 PM 3.35* 0.50 - 1.35 mg/dL Final  05/29/2013  3:01 PM 3.11* 0.50 - 1.35 mg/dL Final  01/18/2012  4:30 PM 3.09* 0.50 - 1.35 mg/dL Final  12/20/2011  5:00 PM 2.70* 0.50 - 1.35 mg/dL Final  12/12/2010  4:44 PM 1.77* 0.50 - 1.35 mg/dL Final  09/07/2010  8:51 AM 2.16* 0.40 - 1.50 mg/dL Final  08/23/2010  4:29 PM 1.84* 0.40 - 1.50 mg/dL Final     Creatinine, Ser  Date/Time Value Ref Range Status  01/16/2014  7:15 AM 3.47* 0.50 - 1.35 mg/dL Final  01/15/2014  4:23 AM 4.31* 0.50 - 1.35 mg/dL Final  08/22/2012  5:10 AM 2.46* 0.50 - 1.35 mg/dL Final  08/21/2012  7:37 PM 2.44* 0.50 - 1.35 mg/dL Final  05/18/2010  8:13 PM 2.19* 0.40-1.50 mg/dL Final     See lab report for associated comment(s)  04/08/2010  8:14 PM 1.93* (0.40-1.50 mg/dL Final  03/30/2010  1:53 AM 2.34* (0.40-1.50 mg/dL Final     See lab report for associated comment(s)  11/25/2009  5:30 AM 1.71* (0.40-1.50 mg/dL Final  07/16/2009  6:24 PM 1.80* (0.40-1.50 mg/dL Final  05/04/2009  8:07 PM 2.41* (0.40-1.50 mg/dL Final     See lab report for associated  comment(s)  02/10/2009  9:13 PM 2.23* (0.40-1.50 mg/dL Final     See lab report for associated comment(s)  01/13/2009  9:17 PM 2.05* (0.40-1.50 mg/dL Final     See lab report for associated comment(s)  12/02/2008  8:31 PM 2.00* 0.40-1.50 mg/dL Final     See lab report for associated comment(s)  11/19/2008  9:26 PM 1.87* 0.40-1.50 mg/dL Final  10/28/2008  7:09 PM 2.5* 0.4 - 1.5 mg/dL Final    PMH:   Past Medical History  Diagnosis Date  . Gout   . CKD (chronic kidney disease) stage 4, GFR 15-29 ml/min     Followed by [facility].  Thought to be due to long-standing HTN  . Essential hypertension   . Gout 05/04/2009    Qualifier: Diagnosis of  By: Dyann Kief MD, Clifton James      PSH:   Past Surgical History   Procedure Laterality Date  . No past surgeries      Allergies: No Known Allergies  Medications:   Prior to Admission medications   Medication Sig Start Date End Date Taking? Authorizing Provider  allopurinol (ZYLOPRIM) 100 MG tablet Take 1 tablet (100 mg total) by mouth daily. 01/01/14 12/31/14 Yes Corky Sox, MD  colchicine 0.6 MG tablet Take 0.3 mg by mouth daily.   Yes Historical Provider, MD  diclofenac sodium (VOLTAREN) 1 % GEL Apply 2 g topically 3 (three) times daily as needed (right wrist pain). Apply to right wrist. 10/02/12  Yes Hester Mates, MD  diphenhydrAMINE (BENADRYL) 25 mg capsule Take 25 mg by mouth every 8 (eight) hours as needed for itching or allergies.   Yes Historical Provider, MD  furosemide (LASIX) 40 MG tablet Take 1 tablet (40 mg total) by mouth daily. 09/19/13  Yes Hester Mates, MD  labetalol (NORMODYNE) 200 MG tablet Take 1 tablet (200 mg total) by mouth 2 (two) times daily. 05/29/13  Yes Neema Bobbie Stack, MD  lisinopril (PRINIVIL,ZESTRIL) 10 MG tablet Take 1 tablet (10 mg total) by mouth daily. 05/29/13 05/29/14 Yes Neema Bobbie Stack, MD  senna-docusate (SENOKOT-S) 8.6-50 MG per tablet Take 1 tablet by mouth daily. 01/13/14  Yes Corky Sox, MD    Inpatient medications: . albuterol  10 mg Nebulization Once  . feeding supplement (NEPRO CARB STEADY)  237 mL Oral TID WC  . heparin  5,000 Units Subcutaneous 3 times per day  . labetalol  200 mg Oral BID  . pantoprazole  40 mg Oral BID AC  . senna-docusate  1 tablet Oral Daily  . sodium bicarbonate  650 mg Oral TID  . sodium chloride  3 mL Intravenous Q12H  . sodium polystyrene  60 g Oral Once    Discontinued Meds:   Medications Discontinued During This Encounter  Medication Reason  . calcium gluconate inj 10% (1 g) URGENT USE ONLY!   Marland Kitchen polyethylene glycol powder (MIRALAX) powder Completed Course  . COLCRYS 0.6 MG tablet Inpatient Standard  . diphenhydrAMINE (BENADRYL) 25 mg capsule Inpatient Standard  . insulin  aspart (novoLOG) injection 10 Units   . dextrose 50 % solution 50 mL   . 0.9 %  sodium chloride infusion   . midazolam (VERSED) injection Patient Discharge  . fentaNYL (SUBLIMAZE) injection Patient Discharge  . sodium polystyrene (KAYEXALATE) 15 GM/60ML suspension 30 g   . 0.9 %  sodium chloride infusion     Social History:  reports that he quit smoking about 6 years ago. He quit smokeless tobacco use about  6 years ago. He reports that he does not drink alcohol or use illicit drugs.  Family History:  History reviewed. No pertinent family history.  Review of Systems  Constitutional: Positive for weight loss. Negative for fever and chills.  Eyes: Negative for blurred vision.  Respiratory: Negative for cough and shortness of breath.   Cardiovascular: Negative for chest pain.  Gastrointestinal: Negative for heartburn and abdominal pain.  Genitourinary: Negative for dysuria.  Musculoskeletal: Negative for myalgias.  Skin: Positive for itching. Negative for rash.  Neurological: Positive for weakness. Negative for dizziness and headaches.  Psychiatric/Behavioral: Negative for substance abuse.    Weight change: 3.5 oz (0.1 kg)  Intake/Output Summary (Last 24 hours) at 01/16/14 1541 Last data filed at 01/16/14 1443  Gross per 24 hour  Intake   2740 ml  Output    651 ml  Net   2089 ml   BP 131/81  Pulse 74  Temp(Src) 97.6 F (36.4 C) (Oral)  Resp 11  Ht 5\' 3"  (1.6 m)  Wt 179 lb 3.7 oz (81.3 kg)  BMI 31.76 kg/m2  SpO2 98% Filed Vitals:   01/16/14 1134 01/16/14 1140 01/16/14 1212 01/16/14 1325  BP: 135/85 143/89 121/68 131/81  Pulse: 88 87 78 74  Temp:   98.2 F (36.8 C) 97.6 F (36.4 C)  TempSrc:   Oral Oral  Resp: 15 11    Height:      Weight:      SpO2: 97% 95% 96% 98%     General: resting in bed, pleasant HEENT: MMM Cardiac: RRR, no rubs, murmurs or gallops Pulm: mild bibasilar crackles Abd: soft, nontender, nondistended, BS present, reducible umbicial  hernia. Ext: some excoriations on right arm, warm and well perfused, no pedal edema Neuro: no focal deficit  Labs: Basic Metabolic Panel:  Recent Labs Lab 01/13/14 1639 01/14/14 1406  01/15/14 0423 01/15/14 0900 01/15/14 1402 01/15/14 1915 01/15/14 2344 01/16/14 0715 01/16/14 1351  NA 140 143  --  139  --   --   --   --  144  --   K 6.5* 6.0*  < > 5.4* 5.9* 6.4* 5.5* 5.7* 5.6* 6.6*  CL 115* 111  --  111  --   --   --   --  119*  --   CO2 18* 17*  --  13*  --   --   --   --  13*  --   GLUCOSE 82 95  --  83  --   --   --   --  88  --   BUN 71* 71*  --  67*  --   --   --   --  54*  --   CREATININE 4.91* 5.02*  --  4.31*  --   --   --   --  3.47*  --   ALBUMIN 4.8 4.1  --  4.0  --   --   --   --  4.0  --   CALCIUM 9.1 9.4  --  8.9  --   --   --   --  8.7  --   < > = values in this interval not displayed. Liver Function Tests:  Recent Labs Lab 01/14/14 1406 01/15/14 0423 01/16/14 0715  AST 19 17 19   ALT 7 7 8   ALKPHOS 75 76 75  BILITOT 0.3 0.3 0.2*  PROT 7.9 7.4 7.5  ALBUMIN 4.1 4.0 4.0   No results found for this basename:  LIPASE, AMYLASE,  in the last 168 hours No results found for this basename: AMMONIA,  in the last 168 hours CBC:  Recent Labs Lab 01/13/14 1639 01/15/14 0423  WBC 4.8 4.0  NEUTROABS 2.7  --   HGB 9.9* 9.7*  HCT 30.1* 28.7*  MCV 81.1 81.1  PLT 137* 94*   PT/INR: @LABRCNTIP (inr:5) Cardiac Enzymes: )No results found for this basename: CKTOTAL, CKMB, CKMBINDEX, TROPONINI,  in the last 168 hours CBG: No results found for this basename: GLUCAP,  in the last 168 hours  Iron Studies: No results found for this basename: IRON, TIBC, TRANSFERRIN, FERRITIN,  in the last 168 hours  Xrays/Other Studies: Ct Abdomen Pelvis Wo Contrast  01/14/2014   CLINICAL DATA:  Abdominal mass.  Pain.  EXAM: CT ABDOMEN AND PELVIS WITHOUT CONTRAST  TECHNIQUE: Multidetector CT imaging of the abdomen and pelvis was performed following the standard protocol without IV  contrast.  COMPARISON:  Abdominal radiographs 01/13/2014  FINDINGS: Lung bases are clear. 2.3 cm diameter circumscribed low-attenuation lesion in the in the segment 6 of the liver, likely representing a cyst. Unenhanced appearance of the gallbladder, spleen, pancreas, adrenal glands, abdominal aorta, inferior vena cava, and retroperitoneal lymph nodes is unremarkable. Diffuse parenchymal atrophy in the kidneys. Sub cm exophytic lesion in the right kidney upper pole probably represents a cyst. Stomach, small bowel, and colon are unremarkable. Prominent visceral adipose tissues. No free air or free fluid in the abdomen. There is a midline abdominal wall hernia at the umbilicus containing fat. Appearance of mild bulging of the flank muscles inferior to the rib cage without discrete herniation. This appearance accounts for the soft tissue prominence in the right flank on prior plain film. No discrete mass is identified.  Pelvis: Prostate gland is not enlarged. Bladder wall is not thickened. No free or loculated pelvic fluid collections. No pelvic mass or lymphadenopathy. Small amount of fat in the left inguinal region. Degenerative changes throughout the spine. No destructive bone lesions appreciated. Reversal of the usual lumbar lordosis is likely degenerative.  IMPRESSION: Prominent visceral adipose tissues with abdominal protrusion under the ribcage appears to cause the soft tissue shadowing seen on previous plain film. No discrete mass or hematoma is demonstrated. There is a moderate-sized umbilical hernia containing fat. Renal atrophy. Hepatic and renal cysts.   Electronically Signed   By: Lucienne Capers M.D.   On: 01/14/2014 22:10     Assessment/Plan: 1.  Hyperkalemia: likely exacerbated by metabolic acidosis and AKI. Agree with primary services that this is possibly tubular defect in potassium handling (cannot call this RTA with advanced CKD)  Patient admitted to herbal supplementation the nature of which  is unknown even to translator. 1. Most recent K+ 6.6, will give Kayexalate 60g, continue monitoring q4. 2. Start PO bicarb supplementation 650 TID 3. Consider D/C labetalol as this can contribute to hyperkalemia, consider alternatives such as doxazosin or other vasodilators. 2. Acute on Chronic Kidney Disease: U/A on admission wnl, FeNa on admission 3%.  CT showed no hydronephrosis, or stones.  His Uric acid on admission was elevated at 10.7.  His SCr has now trended down to 3.5 which is close to his baseline function.  Will need scheduled follow up with Dr. Justin Mend upon discharge. 1. D/C IVF, if needed would prefer isotonic bicarb to help in correcting metabolic acidosis. 3. Non Anion gap metabolic acidosis: Will check Urine Anion Gap but is likely due to underlying kidney diease. 1. Start PO bicarb 650 TID. 4. Puritis: patient  did have some peripheral eosinophilia, doubt his AKI is due to AIN but could consider checking Urine Eosinophils.  His BUN is 54 and downtrending and has no other symptoms to suggest Uremia.  Continue benadryl and monitor. 5. Constipation/ weight loss/ gastritis: patient had EGD and colonoscopy by GI, plan for PPI bid x 1 month then QD.  Will need repeat EGD in a few months.  Patient's symptoms may have been partially due to his worsening renal function.    Lucious Groves, DO IMTS PGY2  01/16/2014, 4:29 PM

## 2014-01-16 NOTE — Progress Notes (Signed)
  Date: 01/16/2014  Patient name: Luke Dunn  Medical record number: [id number]  Date of birth: 11/20/1950   This patient has been seen and the plan of care was discussed with the house staff. Please see their note for complete details. I concur with their findings with the following additions/corrections: pt is now s/p Colon and EGD - esophagitis, gastritis, duodenitis, and possible BArett's.   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 Will stop BB (no CAD) and heparin as both can increase K. Check ARR and AM cortisol. Will not start florinef as nephrology is on board and can better determine need.    Bartholomew Crews, MD 01/16/2014, 4:39 PM

## 2014-01-16 NOTE — Progress Notes (Addendum)
Subjective:  Feels fine. Had 2x water stool in the morning and 1x NBNB emesis this am. Denies any abdominal pain but feels distended. Objective: Vital signs in last 24 hours: Filed Vitals:   01/15/14 1442 01/15/14 2232 01/16/14 0503 01/16/14 0531  BP: 124/70 114/75  112/67  Pulse: 77 77  77  Temp: 98 F (36.7 C) 98.9 F (37.2 C)  98.4 F (36.9 C)  TempSrc: Oral Oral  Oral  Resp: 18 18  18   Height:      Weight:   81.3 kg (179 lb 3.7 oz)   SpO2: 99% 99%  96%   Weight change: 0.1 kg (3.5 oz)  Intake/Output Summary (Last 24 hours) at 01/16/14 0721 Last data filed at 01/16/14 0612  Gross per 24 hour  Intake   2540 ml  Output    651 ml  Net   1889 ml   Vitals reviewed. General: resting in bed, NAD. HEENT: PERRL, EOMI, no scleral icterus Cardiac: RRR, no rubs, murmurs or gallops Pulm: clear to auscultation bilaterally, no wheezes, rales, or rhonchi Abd: soft, no TTP. Distended. Good BS. Umbilical hernia present that reducible.  Ext: warm and well perfused, no pedal edema.  Neuro: alert and oriented X3, cranial nerves II-XII grossly intact, strength and sensation to light touch equal in bilateral upper and lower extremities  Lab Results: Basic Metabolic Panel:  Recent Labs Lab 01/14/14 1406  01/15/14 0423  01/15/14 1915 01/15/14 2344  NA 143  --  139  --   --   --   K 6.0*  < > 5.4*  < > 5.5* 5.7*  CL 111  --  111  --   --   --   CO2 17*  --  13*  --   --   --   GLUCOSE 95  --  83  --   --   --   BUN 71*  --  67*  --   --   --   CREATININE 5.02*  --  4.31*  --   --   --   CALCIUM 9.4  --  8.9  --   --   --   < > = values in this interval not displayed. Liver Function Tests:  Recent Labs Lab 01/14/14 1406 01/15/14 0423  AST 19 17  ALT 7 7  ALKPHOS 75 76  BILITOT 0.3 0.3  PROT 7.9 7.4  ALBUMIN 4.1 4.0   No results found for this basename: LIPASE, AMYLASE,  in the last 168 hours No results found for this basename: AMMONIA,  in the last 168  hours CBC:  Recent Labs Lab 01/13/14 1639 01/15/14 0423  WBC 4.8 4.0  NEUTROABS 2.7  --   HGB 9.9* 9.7*  HCT 30.1* 28.7*  MCV 81.1 81.1  PLT 137* 94*   Cardiac Enzymes: No results found for this basename: CKTOTAL, CKMB, CKMBINDEX, TROPONINI,  in the last 168 hours BNP: No results found for this basename: PROBNP,  in the last 168 hours D-Dimer: No results found for this basename: DDIMER,  in the last 168 hours CBG: No results found for this basename: GLUCAP,  in the last 168 hours Hemoglobin A1C: No results found for this basename: HGBA1C,  in the last 168 hours Fasting Lipid Panel: No results found for this basename: CHOL, HDL, LDLCALC, TRIG, CHOLHDL, LDLDIRECT,  in the last 168 hours Thyroid Function Tests: No results found for this basename: TSH, T4TOTAL, FREET4, T3FREE, THYROIDAB,  in the last  168 hours Coagulation: No results found for this basename: LABPROT, INR,  in the last 168 hours Anemia Panel: No results found for this basename: VITAMINB12, FOLATE, FERRITIN, TIBC, IRON, RETICCTPCT,  in the last 168 hours Urine Drug Screen: Drugs of Abuse  No results found for this basename: labopia,  cocainscrnur,  labbenz,  amphetmu,  thcu,  labbarb    Alcohol Level: No results found for this basename: ETH,  in the last 168 hours Urinalysis:  Recent Labs Lab 01/14/14 2118 01/15/14 1258  COLORURINE YELLOW YELLOW  LABSPEC 1.011 1.010  PHURINE 5.5 5.5  GLUCOSEU NEGATIVE NEGATIVE  HGBUR NEGATIVE NEGATIVE  BILIRUBINUR NEGATIVE NEGATIVE  KETONESUR NEGATIVE NEGATIVE  PROTEINUR NEGATIVE NEGATIVE  UROBILINOGEN 0.2 0.2  NITRITE NEGATIVE NEGATIVE  LEUKOCYTESUR NEGATIVE NEGATIVE   Misc. Labs:  Micro Results: No results found for this or any previous visit (from the past 240 hour(s)). Studies/Results: Ct Abdomen Pelvis Wo Contrast  01/14/2014   CLINICAL DATA:  Abdominal mass.  Pain.  EXAM: CT ABDOMEN AND PELVIS WITHOUT CONTRAST  TECHNIQUE: Multidetector CT imaging of the  abdomen and pelvis was performed following the standard protocol without IV contrast.  COMPARISON:  Abdominal radiographs 01/13/2014  FINDINGS: Lung bases are clear. 2.3 cm diameter circumscribed low-attenuation lesion in the in the segment 6 of the liver, likely representing a cyst. Unenhanced appearance of the gallbladder, spleen, pancreas, adrenal glands, abdominal aorta, inferior vena cava, and retroperitoneal lymph nodes is unremarkable. Diffuse parenchymal atrophy in the kidneys. Sub cm exophytic lesion in the right kidney upper pole probably represents a cyst. Stomach, small bowel, and colon are unremarkable. Prominent visceral adipose tissues. No free air or free fluid in the abdomen. There is a midline abdominal wall hernia at the umbilicus containing fat. Appearance of mild bulging of the flank muscles inferior to the rib cage without discrete herniation. This appearance accounts for the soft tissue prominence in the right flank on prior plain film. No discrete mass is identified.  Pelvis: Prostate gland is not enlarged. Bladder wall is not thickened. No free or loculated pelvic fluid collections. No pelvic mass or lymphadenopathy. Small amount of fat in the left inguinal region. Degenerative changes throughout the spine. No destructive bone lesions appreciated. Reversal of the usual lumbar lordosis is likely degenerative.  IMPRESSION: Prominent visceral adipose tissues with abdominal protrusion under the ribcage appears to cause the soft tissue shadowing seen on previous plain film. No discrete mass or hematoma is demonstrated. There is a moderate-sized umbilical hernia containing fat. Renal atrophy. Hepatic and renal cysts.   Electronically Signed   By: Lucienne Capers M.D.   On: 01/14/2014 22:10   Medications: I have reviewed the patient's current medications. Scheduled Meds: . albuterol  10 mg Nebulization Once  . feeding supplement (NEPRO CARB STEADY)  237 mL Oral TID WC  . heparin  5,000 Units  Subcutaneous 3 times per day  . labetalol  200 mg Oral BID  . senna-docusate  1 tablet Oral Daily  . sodium chloride  3 mL Intravenous Q12H   Continuous Infusions: . sodium chloride 100 mL/hr at 01/15/14 0623   PRN Meds:.acetaminophen, acetaminophen, albuterol, diphenhydrAMINE, morphine injection, promethazine Assessment/Plan: Principal Problem:   Acute renal failure Active Problems:   Essential hypertension, benign   Microcytic anemia   Constipation   Hyperkalemia  47 y man [place] with PMH of HTN, presenting with hyperkalemia (K of 6), ARF, Cr of 5.02 from naseline of 2.7, weight loss with decreased appetite. Also having solid food intolerance.  Constipation and inability to keep down solid food- without pain. This could be due to an obstruction somewhere in the upper GI tract since the food comes out right after swallowing. He is tolerating liquids fine. Suspicious ould be achalasias or a mass in the esophagus. Also concerning as no BM for last 3 weeks  - GI consulted: [id number] sided divertula. two 5-7 mm sessile/semi-pedunc polyps removed. f/p cscope in 5 years. f/up biopsy. EGD: LA grade B esophagitis, probable barret's esophagus. gastritis, duodenitis.   PPI BIDx1 month and then QD. f/up biopsy results. f/up GI Dr. Benson Norway in 1 month. Repeat EGD 2-3 months.  AKi on CKD with hyperkalemia and non gap metabolic acidosis - in the setting of allopurinol and low PO tolerate.  Creating improving with IVF. Came in with 5.02. baseline around 2.7. However, he still has high K+ and NGMetamolic acidosis concerning for RTA.  - consult nephrology.  - continue IVF 100cc/hr.   Hyperkalemia - no EKG changes. K+ 6.5 on admission. s/p kayexalate. Had BM yesterday but K+ remains high - continue q4hr K+ checks. Kayexalate PRN.  Gout - on allopurinol but uric acid remains high 10.7. stopped allopurinol since it can cause AKI.  Pruritis - benadryl. No rash.  Dispo: Disposition is deferred at this  time, awaiting improvement of current medical problems.  Anticipated discharge in approximately 2-3 day(s).   The patient does have a current PCP Corky Sox, MD) and does need an [facility] hospital follow-up appointment after discharge.  The patient does have transportation limitations that hinder transportation to clinic appointments.  .Services Needed at time of discharge: Y = Yes, Blank = No PT:   OT:   RN:   Equipment:   Other:     LOS: 2 days   Dellia Nims, MD 01/16/2014, 7:21 AM

## 2014-01-16 NOTE — Progress Notes (Signed)
Pt encouraged to drink GI prep throughout the night - pt not like the taste - pt drink slowly.

## 2014-01-17 DIAGNOSIS — K2270 Barrett's esophagus without dysplasia: Secondary | ICD-10-CM

## 2014-01-17 DIAGNOSIS — B789 Strongyloidiasis, unspecified: Secondary | ICD-10-CM

## 2014-01-17 DIAGNOSIS — N183 Chronic kidney disease, stage 3 unspecified: Secondary | ICD-10-CM

## 2014-01-17 DIAGNOSIS — K5900 Constipation, unspecified: Secondary | ICD-10-CM

## 2014-01-17 DIAGNOSIS — K200 Eosinophilic esophagitis: Secondary | ICD-10-CM

## 2014-01-17 LAB — RENAL FUNCTION PANEL
Albumin: 3.9 g/dL (ref 3.5–5.2)
Anion gap: 14 (ref 5–15)
BUN: 41 mg/dL — ABNORMAL HIGH (ref 6–23)
CALCIUM: 9.1 mg/dL (ref 8.4–10.5)
CO2: 13 mEq/L — ABNORMAL LOW (ref 19–32)
CREATININE: 2.95 mg/dL — AB (ref 0.50–1.35)
Chloride: 121 mEq/L — ABNORMAL HIGH (ref 96–112)
GFR calc Af Amer: 24 mL/min — ABNORMAL LOW (ref >90)
GFR calc non Af Amer: 21 mL/min — ABNORMAL LOW (ref >90)
GLUCOSE: 86 mg/dL (ref 70–99)
Phosphorus: 3.3 mg/dL (ref 2.3–4.6)
Potassium: 5 mEq/L (ref 3.7–5.3)
Sodium: 148 mEq/L — ABNORMAL HIGH (ref 137–147)

## 2014-01-17 LAB — BASIC METABOLIC PANEL
Anion gap: 13 (ref 5–15)
BUN: 38 mg/dL — ABNORMAL HIGH (ref 6–23)
CHLORIDE: 116 meq/L — AB (ref 96–112)
CO2: 15 meq/L — AB (ref 19–32)
Calcium: 8.8 mg/dL (ref 8.4–10.5)
Creatinine, Ser: 2.91 mg/dL — ABNORMAL HIGH (ref 0.50–1.35)
GFR calc Af Amer: 25 mL/min — ABNORMAL LOW (ref >90)
GFR, EST NON AFRICAN AMERICAN: 21 mL/min — AB (ref >90)
GLUCOSE: 89 mg/dL (ref 70–99)
POTASSIUM: 5.1 meq/L (ref 3.7–5.3)
SODIUM: 144 meq/L (ref 137–147)

## 2014-01-17 LAB — POTASSIUM: POTASSIUM: 5.1 meq/L (ref 3.7–5.3)

## 2014-01-17 MED ORDER — PROMETHAZINE HCL 25 MG/ML IJ SOLN
12.5000 mg | Freq: Four times a day (QID) | INTRAMUSCULAR | Status: DC
  Administered 2014-01-17 – 2014-01-18 (×5): 12.5 mg via INTRAVENOUS
  Filled 2014-01-17 (×9): qty 1

## 2014-01-17 MED ORDER — FUROSEMIDE 10 MG/ML IJ SOLN
20.0000 mg | Freq: Every day | INTRAMUSCULAR | Status: DC
  Administered 2014-01-17 – 2014-01-18 (×2): 20 mg via INTRAVENOUS
  Filled 2014-01-17 (×2): qty 2

## 2014-01-17 MED ORDER — SODIUM POLYSTYRENE SULFONATE 15 GM/60ML PO SUSP
60.0000 g | Freq: Once | ORAL | Status: AC
  Administered 2014-01-17: 60 g via ORAL
  Filled 2014-01-17: qty 240

## 2014-01-17 MED ORDER — CHLORHEXIDINE GLUCONATE 0.12 % MT SOLN
15.0000 mL | Freq: Two times a day (BID) | OROMUCOSAL | Status: DC
  Administered 2014-01-17 – 2014-01-18 (×2): 15 mL via OROMUCOSAL
  Filled 2014-01-17 (×5): qty 15

## 2014-01-17 MED ORDER — SUCRALFATE 1 GM/10ML PO SUSP: 1.0000 g | Freq: Three times a day (TID) | ORAL | Status: DC

## 2014-01-17 MED ORDER — SODIUM CHLORIDE 0.9 % IV SOLN
INTRAVENOUS | Status: AC
  Administered 2014-01-17: 1000 mL via INTRAVENOUS

## 2014-01-17 MED ORDER — FUROSEMIDE 20 MG PO TABS
20.0000 mg | ORAL_TABLET | Freq: Every day | ORAL | Status: DC
  Administered 2014-01-17: 20 mg via ORAL
  Filled 2014-01-17: qty 1

## 2014-01-17 MED ORDER — CETYLPYRIDINIUM CHLORIDE 0.05 % MT LIQD
7.0000 mL | Freq: Two times a day (BID) | OROMUCOSAL | Status: DC
  Administered 2014-01-17: 7 mL via OROMUCOSAL

## 2014-01-17 MED ORDER — HYDROCERIN EX CREA
TOPICAL_CREAM | Freq: Two times a day (BID) | CUTANEOUS | Status: DC
  Administered 2014-01-17 (×2): via TOPICAL
  Administered 2014-01-18: 1 via TOPICAL
  Filled 2014-01-17: qty 113

## 2014-01-17 MED ORDER — ONDANSETRON HCL 4 MG/2ML IJ SOLN: 4.0000 mg | Freq: Three times a day (TID) | INTRAMUSCULAR | Status: DC | PRN

## 2014-01-17 NOTE — Progress Notes (Signed)
Used interpretor [phone] to asked pt. About some medicine he had asked the NT for.  Pt. Stated he didn't need any medication.  Will continue to monitor.  Alphonzo Lemmings, RN

## 2014-01-17 NOTE — Progress Notes (Addendum)
  Date: 01/17/2014  Patient name: Luke Dunn  Medical record number: [id number]  Date of birth: 10/21/1950   This patient has been seen and the plan of care was discussed with the house staff. Please see their note for complete details. I concur with their findings with the following additions/corrections: Mr Redus was seen with Dr Hayes Ludwig and phone translator services were used. He c/o inability to eat or drink, abd pain, and D. Her got Kayexelate for hyperkalemia. He walked quickly to bathroom (was steady on feet) after our talk to retch. He is also itching (was scratching R arm during our talk) all over. He has had eosinophilia since 2010 and has had 2 elevated SED rates.   Diff dx for inability to take food, N/V, ABD pain, itching, eosinophilia.   1. Eosinophilic esophagitis - Could explain all sxs as is seen in those with atopy. Had eso bx taken at EGD but was not on PPI prior to admit. So if there are eso on bx, would need to tx PPI 6 weeks and reassess or do 24 pH monitor.  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 We did not ask about recent travel nor year he came to [place]. But peripheral eso go back to 2010. But EGD and colon showed no characteristic changes.   3. Barrett's - doubt cause of GI sxs as no stricture on EGD.  4. Med induced - had GI sxs prior to [facility] ; wait for bxs from EGD. ELISA for Strongyloidiasis. Repeat CBC as plts decreased. HIT score low prob.  Bartholomew Crews, MD 01/17/2014, 2:04 PM

## 2014-01-17 NOTE — Progress Notes (Signed)
Patient ID: Luke Dunn, male   DOB: 1950-10-29, 63 y.o.   MRN: [id number]  [facility] Progress Note    Assessment/ Plan:   1. Hyperkalemia: Appears to be from acute renal failure and compounded by metabolic acidosis/ongoing beta blocker therapy. From history, patient admits to using some sort of herbal supplement (the nature cannot be confirmed in order to see if this may indeed be a culprit). He most likely has a functional tubular defect in potassium handling with his advancing chronic kidney disease and will need either chronic diuretic therapy or weekly Kayexalate therapy. Because he does not have significant edema, would recommend low dose oral furosemide such as 20 mg daily to be started. 2. Acute on Chronic Kidney Disease: History not really helpful with determining the etiology of his acute renal failure, database points towards an ATN type picture (FENa 3%). Currently, kidney function improving with creatinine down to 2.9 (this was 2.8 back in 2014). 3. Non-anion gap metabolic acidosis: Suspected to be from tubular handling defect of bicarbonate/hydrogen from progressive chronic kidney disease, start oral sodium bicarbonate therapy. Possibly also worsened by his acute renal failure. 4. Pruritis: Suspected to be allergic from his associated peripheral eosinophilia-agree with symptomatic therapy with Benadryl. 5. Constipation/ weight loss/ gastritis: patient had EGD and colonoscopy by GI, plan for PPI bid x 1 month then QD. Will need repeat EGD in a few months. Patient's symptoms may have been partially due to his worsening renal function.  6. Hypernatremia: Encouraged water intake   Subjective:   No acute events overnight, re-treated with Kayexalate due to persistent hyperkalemia    Objective:   BP 126/84  Pulse 87  Temp(Src) 97.5 F (36.4 C) (Oral)  Resp 15  Ht 5\' 3"  (1.6 m)  Wt 78.4 kg (172 lb 13.5 oz)  BMI 30.63 kg/m2  SpO2 98%  Intake/Output Summary (Last 24 hours)  at 01/17/14 [id number] Last data filed at 01/17/14 0910  Gross per 24 hour  Intake    440 ml  Output   1000 ml  Net   -560 ml   Weight change: -2.9 kg (-6 lb 6.3 oz)  Physical Exam: Gen: Comfortably resting in bed CVS: Pulse regular in rate and rhythm Resp: Clear to auscultation bilaterally, no rales Abd: Soft, tender over the epigastric area, bowel sounds normal Ext: No lower extremity edema  Imaging: No results found.  Labs: BMET  Recent Labs Lab 01/13/14 1639 01/14/14 1406  01/15/14 0423  01/15/14 1915 01/15/14 2344 01/16/14 0715 01/16/14 1351 01/16/14 1918 01/16/14 2220 01/17/14 0616  NA 140 143  --  139  --   --   --  144  --   --   --  148*  K 6.5* 6.0*  < > 5.4*  < > 5.5* 5.7* 5.6* 6.6* 5.3 5.6* 5.1  5.0  CL 115* 111  --  111  --   --   --  119*  --   --   --  121*  CO2 18* 17*  --  13*  --   --   --  13*  --   --   --  13*  GLUCOSE 82 95  --  83  --   --   --  88  --   --   --  86  BUN 71* 71*  --  67*  --   --   --  54*  --   --   --  41*  CREATININE  4.91* 5.02*  --  4.31*  --   --   --  3.47*  --   --   --  2.95*  CALCIUM 9.1 9.4  --  8.9  --   --   --  8.7  --   --   --  9.1  PHOS  --   --   --   --   --   --   --   --   --   --   --  3.3  < > = values in this interval not displayed. CBC  Recent Labs Lab 01/13/14 1639 01/15/14 0423  WBC 4.8 4.0  NEUTROABS 2.7  --   HGB 9.9* 9.7*  HCT 30.1* 28.7*  MCV 81.1 81.1  PLT 137* 94*   Medications:    . albuterol  10 mg Nebulization Once  . feeding supplement (NEPRO CARB STEADY)  237 mL Oral TID WC  . pantoprazole  40 mg Oral BID AC  . senna-docusate  1 tablet Oral Daily  . sodium bicarbonate  650 mg Oral TID  . sodium chloride  3 mL Intravenous Q12H   Elmarie Shiley, MD 01/17/2014, 9:52 AM

## 2014-01-17 NOTE — Consult Note (Signed)
Late entry for patient seen together with the resident at 15:30 on 01/16/14. I have personally seen and examined this patient and agree with the assessment/plan as outlined above by Heber [place] DO (PGY2). 63 year old man with chronic kidney disease stage IV likely from underlying hypertension-admitted with abdominal discomfort and has undergone EGD/colonoscopy for further workup. Noted to have persistent hyperkalemia and metabolic acidosis for which were consulted to provide further insight into management. It appears that this is a functional metabolic acidosis/hyperkalemia of progressive chronic kidney disease probably compounded by ongoing beta blocker therapy and unknown herbal supplementation that the patient admits to. Difficult to attribute this to a renal tubular acidosis with advanced chronic kidney disease. In either case, continue current efforts at potassium lowering and will begin sodium bicarbonate for acid buffering. Positive urine anion gap essentially confirms that this metabolic acidosis is of renal origin. Abeera Flannery K.,MD 01/17/2014 9:48 AM

## 2014-01-17 NOTE — Evaluation (Signed)
Clinical/Bedside Swallow Evaluation Patient Details  Name: Luke Dunn MRN: [id number] Date of Birth: [birth year]  Today's Date: 01/17/2014 Time: [id number] SLP Time Calculation (min): 14 min  Past Medical History:  Past Medical History  Diagnosis Date  . Gout   . CKD (chronic kidney disease) stage 4, GFR 15-29 ml/min     Followed by [facility].  Thought to be due to long-standing HTN  . Essential hypertension   . Gout 05/04/2009    Qualifier: Diagnosis of  By: Luke Kief MD, Luke Dunn     Past Surgical History:  Past Surgical History  Procedure Laterality Date  . No past surgeries     HPI:  63 yo male with hx of CKD IV, HTN, gout admitted with hyperkalemia, and abdominal tenderness.  Abdominal xray showed a possible pericolic mass, however CTscan was negative for mass butdid notea hernia. Per MD note, pt. vomits whatever he eats but he can tolerate water intake.Per chart review, the patient has also lost ~20 lbs over the last year and about 12 lbs since 05/2013. EGD performed revealed LA Grade B esophagitis, probable Barrett's esophagus,Gastritis,uodenitis.    Assessment / Plan / Recommendation Clinical Impression  Swallow function assessed for thin and semi-solid (fruit icee) to be within functional limits.  Oral manipulation and transit functional.  Swallow initiation, laryngeal elevation during manual palpation appeared typical.  No indications of pharyngeal dysphagia or decreased airway protection.  He politely declined jello, broth or solid texture.  Pt.'s symptoms appear related to diagnosed GI impairments.  Recommend continue clear liquids and advance if he can tolerate, remain upright position minimum 45 minutes after meals, avoid acidic/spicy foods.  No furhter ST needed at this time.     Aspiration Risk  Mild    Diet Recommendation Thin liquid (clear, upgrade as able to tolerate)   Liquid Administration via: Cup;Straw Medication Administration: Whole meds with  liquid Supervision: Patient able to self feed Compensations: Slow rate;Small sips/bites Postural Changes and/or Swallow Maneuvers: Seated upright 90 degrees;Upright 30-60 min after meal    Other  Recommendations Oral Care Recommendations: Oral care BID   Follow Up Recommendations  None    Frequency and Duration        Pertinent Vitals/Pain WDL         Swallow Study           Oral/Motor/Sensory Function Overall Oral Motor/Sensory Function: Appears within functional limits for tasks assessed   Ice Chips Ice chips: Not tested   Thin Liquid Thin Liquid: Within functional limits Presentation: Cup;Straw    Nectar Thick Nectar Thick Liquid: Not tested   Honey Thick Honey Thick Liquid: Not tested   Puree Puree: Not tested (pt. refused)   Solid   GO    Solid: Not tested (pt. refused)       Luke Dunn 01/17/2014,9:11 AM  [phone]

## 2014-01-17 NOTE — Progress Notes (Signed)
Subjective: Patient seen and examined this morning with help from phone interpreter. He is not able to tolerated liquids with N/V throughout  the night that was exacerbated by kayexalate. He is not able to tolerate even liquids which he tolerated well prior to coming to the hospital. Had another BM overnight. Denies sore throat, abdominal pain.   Objective: Vital signs in last 24 hours: Filed Vitals:   01/16/14 1212 01/16/14 1325 01/16/14 2103 01/17/14 0558  BP: 121/68 131/81 114/74 126/84  Pulse: 78 74 92 87  Temp: 98.2 F (36.8 C) 97.6 F (36.4 C) 97.8 F (36.6 C) 97.5 F (36.4 C)  TempSrc: Oral Oral Oral Oral  Resp:   15 15  Height:      Weight:    172 lb 13.5 oz (78.4 kg)  SpO2: 96% 98% 98% 98%   Weight change: -6 lb 6.3 oz (-2.9 kg)  Intake/Output Summary (Last 24 hours) at 01/17/14 1043 Last data filed at 01/17/14 0910  Gross per 24 hour  Intake    440 ml  Output   1000 ml  Net   -560 ml   Vitals reviewed.  General: resting in bed, in mild distress due to nausea, actively vomiting as I leave the room.  HEENT: no scleral icterus  Cardiac: RRR, no rubs, murmurs or gallops  Pulm: clear to auscultation bilaterally, no wheezes, rales, or rhonchi  Abd: soft, no TTP. Distended. Good BS. Umbilical hernia present that is reducible.  Ext: warm and well perfused, no pedal edema.  Neuro: alert, moves all extremities voluntarily  Lab Results: Basic Metabolic Panel:  Recent Labs Lab 01/16/14 0715  01/16/14 2220 01/17/14 0616  NA 144  --   --  148*  K 5.6*  < > 5.6* 5.1  5.0  CL 119*  --   --  121*  CO2 13*  --   --  13*  GLUCOSE 88  --   --  86  BUN 54*  --   --  41*  CREATININE 3.47*  --   --  2.95*  CALCIUM 8.7  --   --  9.1  PHOS  --   --   --  3.3  < > = values in this interval not displayed. Liver Function Tests:  Recent Labs Lab 01/15/14 0423 01/16/14 0715 01/17/14 0616  AST 17 19  --   ALT 7 8  --   ALKPHOS 76 75  --   BILITOT 0.3 0.2*  --   PROT  7.4 7.5  --   ALBUMIN 4.0 4.0 3.9   CBC:  Recent Labs Lab 01/13/14 1639 01/15/14 0423  WBC 4.8 4.0  NEUTROABS 2.7  --   HGB 9.9* 9.7*  HCT 30.1* 28.7*  MCV 81.1 81.1  PLT 137* 94*   Medications: I have reviewed the patient's current medications. Scheduled Meds: . albuterol  10 mg Nebulization Once  . feeding supplement (NEPRO CARB STEADY)  237 mL Oral TID WC  . furosemide  20 mg Oral Daily  . pantoprazole  40 mg Oral BID AC  . senna-docusate  1 tablet Oral Daily  . sodium bicarbonate  650 mg Oral TID  . sodium chloride  3 mL Intravenous Q12H   Continuous Infusions:  PRN Meds:.acetaminophen, acetaminophen, albuterol, diphenhydrAMINE, morphine injection, promethazine Assessment/Plan: 61 y [place] man with PMH of HTN, presenting with hyperkalemia (K of 6), ARF, Cr of 5.02 from baseline of 2.7, weight loss with decreased appetite. Also having solid food intolerance.  Food regurgitation:  Denies odynophagia. EGD on 8/28 with LA grade B esophagitis, probable barret's esophagus. gastritis, duodenitis. He was tolerating liquids well prior to presentation but overnight has had increased nausea, not able to tolerate even liquids. Kayexalate PO seemed to have made his N/V even worse. -Per GI recs: Continue protonix BIDx1 month and then daily, f/up biopsy results. f/up GI Dr. Benson Norway in 1 month. Repeat EGD 2-3 months.  -ordered strongyloids Ab (he also has eosinophilia concerning for parasitic infection)   Constipation: Resolved. He reported 3 weeks of constipation on presentation that resolved with kayexalate. He had diarrhea yesterday with colonoscopy bowel prep. Colonoscopy on 8/29 revealed left sided divericula and  two 5-7 mm sessile/semi-pedunc polyps which were removed but no signs of obstruction.  -Continue monitoring  AKI on CKD stage 3 with non gap metabolic acidosis - Multifactorial. Likely prerenal due to decrease PO intake and possible interstitial nephritis due to allopurinol  use. 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 Baseline Cr of 2.7, 5 on presentation that is gradually trending down 3.47->2.95 with IV hydration.  - Nephrology following, appreciate recs - Continue IVF, NS at 4ml/hr given that he is still not tolerating PO intake - f/u am cortisol - f/u aldo/renin level   Hyperkalemia - no EKG changes. K+ 6.5 on admission. s/p kayexalate. Had BM yesterday but K+ remains high  -Per Nephrology: Start low dose Lasix, kayexalate per rectum (avoid per mouth as this worsens his N/V) -BMET this pm and in am   Gout - on allopurinol but uric acid remains high at 10.7 but asymptomatic.  -Hold allopurinol since it may worsen his renal function.   Pruritis - With dry skin, though could be 2/2 to uremia.  -Avoid Benadryl (per Beer's criteria) -Eucerin cream BID for dry skin  Dispo: Disposition is deferred at this time, awaiting improvement of current medical problems.  Anticipated discharge in approximately 1-2 day(s).   The patient does have a current PCP Corky Sox, MD) and does need an [facility] hospital follow-up appointment after discharge.  The patient does not have transportation limitations that hinder transportation to clinic appointments.  .Services Needed at time of discharge: Y = Yes, Blank = No PT:   OT:   RN:   Equipment:   Other:     LOS: 3 days   Blain Pais, MD 01/17/2014, 10:43 AM

## 2014-01-18 LAB — BASIC METABOLIC PANEL
Anion gap: 14 (ref 5–15)
BUN: 38 mg/dL — ABNORMAL HIGH (ref 6–23)
CHLORIDE: 114 meq/L — AB (ref 96–112)
CO2: 15 meq/L — AB (ref 19–32)
Calcium: 8.8 mg/dL (ref 8.4–10.5)
Creatinine, Ser: 2.83 mg/dL — ABNORMAL HIGH (ref 0.50–1.35)
GFR calc Af Amer: 25 mL/min — ABNORMAL LOW (ref >90)
GFR calc non Af Amer: 22 mL/min — ABNORMAL LOW (ref >90)
Glucose, Bld: 112 mg/dL — ABNORMAL HIGH (ref 70–99)
POTASSIUM: 5 meq/L (ref 3.7–5.3)
SODIUM: 143 meq/L (ref 137–147)

## 2014-01-18 LAB — CBC
HCT: 29.8 % — ABNORMAL LOW (ref 39.0–52.0)
Hemoglobin: 9.8 g/dL — ABNORMAL LOW (ref 13.0–17.0)
MCH: 27.1 pg (ref 26.0–34.0)
MCHC: 32.9 g/dL (ref 30.0–36.0)
MCV: 82.5 fL (ref 78.0–100.0)
Platelets: 134 10*3/uL — ABNORMAL LOW (ref 150–400)
RBC: 3.61 MIL/uL — AB (ref 4.22–5.81)
RDW: 15.4 % (ref 11.5–15.5)
WBC: 4.8 10*3/uL (ref 4.0–10.5)

## 2014-01-18 LAB — CORTISOL-AM, BLOOD: Cortisol - AM: 19 ug/dL (ref 4.3–22.4)

## 2014-01-18 MED ORDER — PROMETHAZINE HCL 12.5 MG PO TABS: 12.5000 mg | ORAL_TABLET | Freq: Four times a day (QID) | ORAL | Status: DC | PRN

## 2014-01-18 MED ORDER — PANTOPRAZOLE SODIUM 40 MG PO TBEC: 40.0000 mg | DELAYED_RELEASE_TABLET | Freq: Two times a day (BID) | ORAL | Status: DC

## 2014-01-18 MED ORDER — FUROSEMIDE 40 MG PO TABS
40.0000 mg | ORAL_TABLET | Freq: Every day | ORAL | Status: DC
  Administered 2014-01-18: 40 mg via ORAL
  Filled 2014-01-18: qty 1

## 2014-01-18 MED ORDER — SODIUM BICARBONATE 650 MG PO TABS: 1300.0000 mg | ORAL_TABLET | Freq: Three times a day (TID) | ORAL | Status: DC

## 2014-01-18 MED ORDER — ONDANSETRON HCL 4 MG/2ML IJ SOLN: 4.0000 mg | Freq: Three times a day (TID) | INTRAMUSCULAR | Status: DC | PRN

## 2014-01-18 MED ORDER — NEPRO/CARBSTEADY PO LIQD: 237.0000 mL | Freq: Three times a day (TID) | ORAL | Status: DC

## 2014-01-18 MED ORDER — SODIUM BICARBONATE 650 MG PO TABS
1300.0000 mg | ORAL_TABLET | Freq: Three times a day (TID) | ORAL | Status: DC
  Administered 2014-01-18: 1300 mg via ORAL
  Filled 2014-01-18 (×2): qty 2

## 2014-01-18 MED ORDER — HYDROCERIN EX CREA: 2.0000 "application " | TOPICAL_CREAM | Freq: Two times a day (BID) | CUTANEOUS | Status: AC

## 2014-01-18 NOTE — Progress Notes (Signed)
Luke Dunn discharged Home per MD order.  Discharge instructions reviewed and discussed with the patient with the interpretor [phone], all questions and concerns answered. Copy of instructions and care notes of news diagnosis and medications reviewed with Luke Dunn, congregational nurse & given to patient.    Medication List    STOP taking these medications       allopurinol 100 MG tablet  Commonly known as:  ZYLOPRIM     colchicine 0.6 MG tablet     diclofenac sodium 1 % Gel  Commonly known as:  VOLTAREN     diphenhydrAMINE 25 mg capsule  Commonly known as:  BENADRYL     labetalol 200 MG tablet  Commonly known as:  NORMODYNE     lisinopril 10 MG tablet  Commonly known as:  PRINIVIL,ZESTRIL      TAKE these medications       feeding supplement (NEPRO CARB STEADY) Liqd  Take 237 mLs by mouth 3 (three) times daily with meals.     furosemide 40 MG tablet  Commonly known as:  LASIX  Take 1 tablet (40 mg total) by mouth daily.     hydrocerin Crea  Apply 2 application topically 2 (two) times daily.     pantoprazole 40 MG tablet  Commonly known as:  PROTONIX  Take 1 tablet (40 mg total) by mouth 2 (two) times daily before a meal.     promethazine 12.5 MG tablet  Commonly known as:  PHENERGAN  Take 1 tablet (12.5 mg total) by mouth every 6 (six) hours as needed for nausea.     senna-docusate 8.6-50 MG per tablet  Commonly known as:  Senokot-S  Take 1 tablet by mouth daily.     sodium bicarbonate 650 MG tablet  Take 2 tablets (1,300 mg total) by mouth 3 (three) times daily.        Patients skin is clean, dry and intact, no evidence of skin break down. IV site discontinued and catheter remains intact. Site without signs and symptoms of complications. Dressing and pressure applied.  Patient escorted to car by NT in a wheelchair,  no distress noted upon discharge.  [name], Banner Huckaba C 01/18/2014 6:40 PM

## 2014-01-18 NOTE — Progress Notes (Signed)
Paged and spoke with Dr. Genene Churn about advancing pt. Diet, pt. Wasn't nauseated anymore and wanted to eat a hamburger instead of clear liquids.  Dr. Genene Churn stated he was thinking about  sending pt. Home today on a clear liquid because every time he eats solid foods he vomits and for the pt. To advance diet at home.   Will continue to monitor.  Alphonzo Lemmings, RN

## 2014-01-18 NOTE — Progress Notes (Signed)
S:Patient reports feeling better, notes he was able to keep breakfast down and that his itching has improved.   O:BP 108/75  Pulse 94  Temp(Src) 97.9 F (36.6 C) (Oral)  Resp 16  Ht 5\' 3"  (1.6 m)  Wt 172 lb 13.5 oz (78.4 kg)  BMI 30.63 kg/m2  SpO2 98%  Intake/Output Summary (Last 24 hours) at 01/18/14 0919 Last data filed at 01/18/14 0850  Gross per 24 hour  Intake 741.25 ml  Output   1500 ml  Net -758.75 ml   Intake/Output: I/O last 3 completed shifts: In: 541.3 [P.O.:120; I.V.:421.3] Out: 2200 [Urine:2200]  Intake/Output this shift:  Total I/O In: 320 [P.O.:320] Out: -  Weight change: 0 lb (0 kg) [id number] in bed in NAD CVS:RRR no r/m/g Resp:CTAB [id number], non tender, non distended, umbilical hernia Ext:no edema   Recent Labs Lab 01/13/14 1639 01/14/14 1406  01/15/14 0423  01/15/14 2344 01/16/14 0715 01/16/14 1351 01/16/14 1918 01/16/14 2220 01/17/14 0616 01/17/14 2000  NA 140 143  --  139  --   --  144  --   --   --  148* 144  K 6.5* 6.0*  < > 5.4*  < > 5.7* 5.6* 6.6* 5.3 5.6* 5.1  5.0 5.1  CL 115* 111  --  111  --   --  119*  --   --   --  121* 116*  CO2 18* 17*  --  13*  --   --  13*  --   --   --  13* 15*  GLUCOSE 82 95  --  83  --   --  88  --   --   --  86 89  BUN 71* 71*  --  67*  --   --  54*  --   --   --  41* 38*  CREATININE 4.91* 5.02*  --  4.31*  --   --  3.47*  --   --   --  2.95* 2.91*  ALBUMIN 4.8 4.1  --  4.0  --   --  4.0  --   --   --  3.9  --   CALCIUM 9.1 9.4  --  8.9  --   --  8.7  --   --   --  9.1 8.8  PHOS  --   --   --   --   --   --   --   --   --   --  3.3  --   AST 16 19  --  17  --   --  19  --   --   --   --   --   ALT <8 7  --  7  --   --  8  --   --   --   --   --   < > = values in this interval not displayed. Liver Function Tests:  Recent Labs Lab 01/14/14 1406 01/15/14 0423 01/16/14 0715 01/17/14 0616  AST 19 17 19   --   ALT 7 7 8   --   ALKPHOS 75 76 75  --   BILITOT 0.3 0.3 0.2*  --   PROT 7.9 7.4 7.5  --    ALBUMIN 4.1 4.0 4.0 3.9   No results found for this basename: LIPASE, AMYLASE,  in the last 168 hours No results found for this basename: AMMONIA,  in the last 168 hours CBC:  Recent Labs  Lab 01/13/14 1639 01/15/14 0423  WBC 4.8 4.0  NEUTROABS 2.7  --   HGB 9.9* 9.7*  HCT 30.1* 28.7*  MCV 81.1 81.1  PLT 137* 94*   Cardiac Enzymes: No results found for this basename: CKTOTAL, CKMB, CKMBINDEX, TROPONINI,  in the last 168 hours CBG: No results found for this basename: GLUCAP,  in the last 168 hours  Iron Studies: No results found for this basename: IRON, TIBC, TRANSFERRIN, FERRITIN,  in the last 72 hours Studies/Results: No results found. Marland Kitchen albuterol  10 mg Nebulization Once  . antiseptic oral rinse  7 mL Mouth Rinse q12n4p  . chlorhexidine  15 mL Mouth Rinse BID  . feeding supplement (NEPRO CARB STEADY)  237 mL Oral TID WC  . furosemide  20 mg Intravenous Daily  . hydrocerin   Topical BID  . pantoprazole  40 mg Oral BID AC  . promethazine  12.5 mg Intravenous Q6H  . senna-docusate  1 tablet Oral Daily  . sodium bicarbonate  650 mg Oral TID  . sodium chloride  3 mL Intravenous Q12H    BMET    Component Value Date/Time   NA 144 01/17/2014 2000   K 5.1 01/17/2014 2000   CL 116* 01/17/2014 2000   CO2 15* 01/17/2014 2000   GLUCOSE 89 01/17/2014 2000   BUN 38* 01/17/2014 2000   CREATININE 2.91* 01/17/2014 2000   CREATININE 5.02* 01/14/2014 1406   CALCIUM 8.8 01/17/2014 2000   GFRNONAA 21* 01/17/2014 2000   GFRNONAA 11* 01/13/2014 1639   GFRAA 25* 01/17/2014 2000   GFRAA 13* 01/13/2014 1639   CBC    Component Value Date/Time   WBC 4.0 01/15/2014 0423   RBC 3.54* 01/15/2014 0423   HGB 9.7* 01/15/2014 0423   HCT 28.7* 01/15/2014 0423   PLT 94* 01/15/2014 0423   MCV 81.1 01/15/2014 0423   MCH 27.4 01/15/2014 0423   MCHC 33.8 01/15/2014 0423   RDW 14.9 01/15/2014 0423   LYMPHSABS 1.2 01/13/2014 1639   MONOABS 0.3 01/13/2014 1639   EOSABS 0.6 01/13/2014 1639   BASOSABS 0.0 01/13/2014  1639     Assessment/Plan:  1. Hyperkalemia: Multifactorial: likely from ARF, with metabolic acidosis and beta blocker therapy.  Likely defect in potassium handling with advance CKD.  Recommend PO lasix 20mg  daily. (currently on lasix 20mg  IV due to N/V).  Renin aldosterone pending, Cortisol wnl. 2. Acute on Chronic Kidney Disease: likely secondary to ATN from labs.  He may be back to his baseline function with a SCr of 2.9.  He will need follow up with Dr. Justin Mend after discharge. 3. Non AG metabolic acidosis:  Patient has been started on PO bicarb 650mg  TID, has had modest improvement in serum bicarb to 15.  Recommend increase to 1300 TID.  His Urine Anion gap was positive at 27 suggesting a tubular handling defect. 4. 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 5. Hypernatremia: resolved.  Lucious Groves, DO IMTS PGY2 Pager : [phone] 01/18/14 9:33 AM

## 2014-01-18 NOTE — Discharge Instructions (Signed)
It was a pleasure taking care of you. You were admitted for high potassium and high creatinine. It's currently improving. You must follow up with your kidney doctor and your primary care doctor for further management.   Please eat a liquid diet (broths, juice, etc.) for now. Don't eat solids.

## 2014-01-18 NOTE — Progress Notes (Signed)
RN called & spoke with O'Brien,Carolyn, to inform her that pt. Had been discharged and RN was inform that she would be picking pt. Up at discharge.  Hoyle Sauer was out running errands and was going to call RN back once she got home.  Will continue to monitor and await her call.  Alphonzo Lemmings, RN

## 2014-01-18 NOTE — Progress Notes (Addendum)
Subjective: Tolerated clear liquids. Denies nausea/vomitting/diarrhea today. Denies any pain, fever/chills.   Objective: Vital signs in last 24 hours: Filed Vitals:   01/17/14 0558 01/17/14 1403 01/17/14 2123 01/18/14 0549  BP: 126/84 127/81 108/73 108/75  Pulse: 87 104 88 94  Temp: 97.5 F (36.4 C) 98.5 F (36.9 C) 98.1 F (36.7 C) 97.9 F (36.6 C)  TempSrc: Oral Oral Oral Oral  Resp: 15 16 16 16   Height:      Weight: 78.4 kg (172 lb 13.5 oz)   78.4 kg (172 lb 13.5 oz)  SpO2: 98% 97% 97% 98%   Weight change: 0 kg (0 lb)  Intake/Output Summary (Last 24 hours) at 01/18/14 1017 Last data filed at 01/18/14 0850  Gross per 24 hour  Intake 741.25 ml  Output   1500 ml  Net -758.75 ml   Vitals reviewed.  General: resting in bed, NAD HEENT: no scleral icterus  Cardiac: RRR, no rubs, murmurs or gallops  Pulm: clear to auscultation bilaterally, no wheezes, rales, or rhonchi  Abd: soft, no TTP. Distended. Good BS. Umbilical hernia present that is reducible.  Ext: warm and well perfused, no pedal edema.  Neuro: alert, moves all extremities voluntarily  Lab Results: Basic Metabolic Panel:  Recent Labs Lab 01/17/14 0616 01/17/14 2000 01/18/14 0935  NA 148* 144 143  K 5.1  5.0 5.1 5.0  CL 121* 116* 114*  CO2 13* 15* 15*  GLUCOSE 86 89 112*  BUN 41* 38* 38*  CREATININE 2.95* 2.91* 2.83*  CALCIUM 9.1 8.8 8.8  PHOS 3.3  --   --    Liver Function Tests:  Recent Labs Lab 01/15/14 0423 01/16/14 0715 01/17/14 0616  AST 17 19  --   ALT 7 8  --   ALKPHOS 76 75  --   BILITOT 0.3 0.2*  --   PROT 7.4 7.5  --   ALBUMIN 4.0 4.0 3.9   CBC:  Recent Labs Lab 01/13/14 1639 01/15/14 0423  WBC 4.8 4.0  NEUTROABS 2.7  --   HGB 9.9* 9.7*  HCT 30.1* 28.7*  MCV 81.1 81.1  PLT 137* 94*   Medications: I have reviewed the patient's current medications. Scheduled Meds: . albuterol  10 mg Nebulization Once  . antiseptic oral rinse  7 mL Mouth Rinse q12n4p  .  chlorhexidine  15 mL Mouth Rinse BID  . feeding supplement (NEPRO CARB STEADY)  237 mL Oral TID WC  . furosemide  20 mg Intravenous Daily  . hydrocerin   Topical BID  . pantoprazole  40 mg Oral BID AC  . promethazine  12.5 mg Intravenous Q6H  . senna-docusate  1 tablet Oral Daily  . sodium bicarbonate  650 mg Oral TID  . sodium chloride  3 mL Intravenous Q12H   Continuous Infusions:  PRN Meds:.acetaminophen, acetaminophen, albuterol, morphine injection, ondansetron (ZOFRAN) IV, promethazine Assessment/Plan: 68 y [place] man with PMH of HTN, presenting with hyperkalemia (K of 6), ARF, Cr of 5.02 from baseline of 2.7, weight loss with decreased appetite. Also having solid food intolerance.   Food regurgitation:  Denies odynophagia. EGD on 8/28 with LA grade B esophagitis, probable barret's esophagus. gastritis, duodenitis. He was tolerating liquids well prior but was having emesis with liquids likely due to kayexalate. We stopped kayexalate and now he is doing better with liquids. -Per GI recs: Continue protonix BIDx1 month and then daily, f/up biopsy results. f/up GI Dr. Benson Norway in 1 month. Repeat EGD 2-3 months.  -ordered strongyloids  Ab (he also has eosinophilia concerning for parasitic infection)  - since he is tolerating liquids, will send him home with the recommendation of clear liquids.  Constipation: Resolved. He reported 3 weeks of constipation on presentation that resolved with kayexalate. He had diarrhea yesterday with colonoscopy bowel prep. Colonoscopy on 8/29 revealed left sided divericula and  two 5-7 mm sessile/semi-pedunc polyps which were removed but no signs of obstruction.  -Continue monitoring  AKI on CKD stage 3 with non gap metabolic acidosis - Multifactorial. 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 Type 4 RTA also possible given his persistent hyperkalemia--the pt reports recent use of medicine herbs but is not  sure of its name. Baseline Cr of 2.7, 5 on presentation that is gradually trending down 3.47->2.95 with IV hydration.  - Nephrology following - cortisol normal - f/u aldo/renin level  Hyperkalemia - no EKG changes. K+ 6.5 on admission. s/p kayexalate. Had BM yesterday but K+ remains high  -Lasix 40mg  PO daily, bicarb 1600mg  TID, kayexalate per rectum (avoid per mouth as this worsens his N/V) - f/up closely with Dr. Justin Mend nephrology.   Gout - on allopurinol but uric acid remains high at 10.7 but asymptomatic.  -Hold allopurinol since it may worsen his renal function.   Pruritis - With dry skin, though could be 2/2 to uremia.  -Avoid Benadryl (per Beer's criteria) -Eucerin cream BID for dry skin  Dispo: Disposition is deferred at this time, awaiting improvement of current medical problems.  Anticipated discharge in approximately 1-2 day(s).   The patient does have a current PCP Corky Sox, MD) and does need an [facility] hospital follow-up appointment after discharge.  The patient does not have transportation limitations that hinder transportation to clinic appointments.  .Services Needed at time of discharge: Y = Yes, Blank = No PT:   OT:   RN:   Equipment:   Other:     LOS: 4 days   Dellia Nims, MD 01/18/2014, 10:17 AM

## 2014-01-18 NOTE — Discharge Summary (Signed)
Name: Luke Dunn MRN: [id number] DOB: [birth year] 63 y.o. PCP: Luke Sox, MD  Date of Admission: 01/14/2014  6:09 PM Date of Discharge: 01/18/2014 Attending Physician: Luke Crews, MD  Discharge Diagnosis: Principal Problem:   Acute renal failure Active Problems:   Essential hypertension, benign   Microcytic anemia   Constipation   Hyperkalemia  Discharge Medications:   Medication List    STOP taking these medications       allopurinol 100 MG tablet  Commonly known as:  ZYLOPRIM     colchicine 0.6 MG tablet     diclofenac sodium 1 % Gel  Commonly known as:  VOLTAREN     diphenhydrAMINE 25 mg capsule  Commonly known as:  BENADRYL     labetalol 200 MG tablet  Commonly known as:  NORMODYNE     lisinopril 10 MG tablet  Commonly known as:  PRINIVIL,ZESTRIL      TAKE these medications       feeding supplement (NEPRO CARB STEADY) Liqd  Take 237 mLs by mouth 3 (three) times daily with meals.     furosemide 40 MG tablet  Commonly known as:  LASIX  Take 1 tablet (40 mg total) by mouth daily.     hydrocerin Crea  Apply 2 application topically 2 (two) times daily.     ondansetron 4 MG/2ML Soln injection  Commonly known as:  ZOFRAN  Inject 2 mLs (4 mg total) into the vein every 8 (eight) hours as needed for nausea, vomiting or refractory nausea / vomiting.     pantoprazole 40 MG tablet  Commonly known as:  PROTONIX  Take 1 tablet (40 mg total) by mouth 2 (two) times daily before a meal.     senna-docusate 8.6-50 MG per tablet  Commonly known as:  Senokot-S  Take 1 tablet by mouth daily.     sodium bicarbonate 650 MG tablet  Take 2 tablets (1,300 mg total) by mouth 3 (three) times daily.        Disposition and follow-up:   [name] was discharged from [facility] in [place] condition.  At the hospital follow up visit please address:  1. Please assess his food regurgitation. We are expecting it will improve with PPI treatment. He was  asked to only have liquid diet for now.  2.  Labs / imaging needed at time of follow-up: BMP.  3.  Pending labs/ test needing follow-up: f/u aldo/renin level, f/up strongyloides ab. F/up biopsy results from EGD and colonoscopy.   Follow-up Appointments: Follow-up Information   Follow up with Dunn,Luke D, MD. Schedule an appointment as soon as possible for a visit in 1 month.   Specialty:  Gastroenterology   Contact information:   [address] [place] [address] [phone]       Schedule an appointment as soon as possible for a visit with Luke Croon, MD.   Specialty:  Nephrology   Contact information:   [place] [address] [phone]       Discharge Instructions:   Consultations: Treatment Team:  Clayborne Dana. Posey Pronto, MD  Procedures Performed:  Ct Abdomen Pelvis Wo Contrast  01/14/2014   CLINICAL DATA:  Abdominal mass.  Pain.  EXAM: CT ABDOMEN AND PELVIS WITHOUT CONTRAST  TECHNIQUE: Multidetector CT imaging of the abdomen and pelvis was performed following the standard protocol without IV contrast.  COMPARISON:  Abdominal radiographs 01/13/2014  FINDINGS: Lung bases are clear. 2.3 cm diameter circumscribed low-attenuation lesion in  the in the segment 6 of the liver, likely representing a cyst. Unenhanced appearance of the gallbladder, spleen, pancreas, adrenal glands, abdominal aorta, inferior vena cava, and retroperitoneal lymph nodes is unremarkable. Diffuse parenchymal atrophy in the kidneys. Sub cm exophytic lesion in the right kidney upper pole probably represents a cyst. Stomach, small bowel, and colon are unremarkable. Prominent visceral adipose tissues. No free air or free fluid in the abdomen. There is a midline abdominal wall hernia at the umbilicus containing fat. Appearance of mild bulging of the flank muscles inferior to the rib cage without discrete herniation. This appearance accounts for the soft tissue prominence in the right flank on  prior plain film. No discrete mass is identified.  Pelvis: Prostate gland is not enlarged. Bladder wall is not thickened. No free or loculated pelvic fluid collections. No pelvic mass or lymphadenopathy. Small amount of fat in the left inguinal region. Degenerative changes throughout the spine. No destructive bone lesions appreciated. Reversal of the usual lumbar lordosis is likely degenerative.  IMPRESSION: Prominent visceral adipose tissues with abdominal protrusion under the ribcage appears to cause the soft tissue shadowing seen on previous plain film. No discrete mass or hematoma is demonstrated. There is a moderate-sized umbilical hernia containing fat. Renal atrophy. Hepatic and renal cysts.   Electronically Signed   By: Lucienne Capers M.D.   On: 01/14/2014 22:10   Dg Abd 1 View  01/14/2014   CLINICAL DATA:  Constipation.  Nausea.  EXAM: ABDOMEN - 1 VIEW  COMPARISON:  None.  FINDINGS: Soft tissue prominence noted over the right flank. This could just represent prominent abdominal wall musculature however a right flank/abdominal sidewall/ right pericolic mass cannot be excluded. Minimal thickening of the left colonic wall cannot be excluded. IV contrast enhanced CT abdomen and pelvis suggested to further evaluate these findings. The gas pattern is nonspecific. No large stool volume noted to suggest constipation. Moderate amount of stool in the right colon. No free air. Mild left base subsegmental atelectasis. Thoracolumbar spine ankylosis consistent with ankylosing spondylitis. Degenerative changes both hips. No evidence of sacroiliitis. No acute bony abnormality. Sclerotic density noted in the left femoral neck. No other sclerotic densities are noted. This is most likely a bone island.  IMPRESSION: 1. Ill-defined soft tissue prominence noted over the right flank. This could represent prominent abdominal wall musculature however right flank/abdominal sidewall/right pericolic mass cannot be excluded. 2.  Questionable mild left colonic wall thickening. IV contrast-enhanced CT of the abdomen pelvis suggested for further evaluation of the right flank and left colon. 3. Ankylosing spondylitis thoracolumbar spine. 4. Left base subsegmental atelectasis.   Electronically Signed   By: Marcello Moores  Register   On: 01/14/2014 08:36    2D Echo:   Cardiac Cath:   Admission HPI:   63 yo male with hx of CKD IV, HTN, gout presents with AKI, hyperkalemia, and abdominal tenderness. Yesterday He came to clinic for follow up and labs were drawn. Today his labs showed He has been taking allopurinol 100mg  QD and Colchicine 0.3 mg qd for gout flare since 10/03/13. He was back today because labs drawn yesterday showed K+ 6.5, with BUN/crt 71/4.91. Baseline crt around 2.76. He was given kayexalate, no EKG changes. He also had an abdominal xray showing a possible pericolic mass. He had been constipated for last 3 weeks without any bowel movement. He throws up whatever he eats. But he can tolerate water intake.Per chart review, the patient has also lost ~20 lbs over the last year and  about 12 lbs since 05/2013. The patient states his appetite has not been well for the past 1 month.  He is having good UOP. Has knee pain both legs but denies pain anywhere else. Drinking plenty of water daily.  Patient is vietnamese speaking only.   Hospital Course by problem list:  51 y [place] man with PMH of HTN, presenting with hyperkalemia (K of 6), ARF, Cr of 5.02 from baseline of 2.7, weight loss with decreased appetite. Also having solid food intolerance.  Food regurgitation: Denies odynophagia. EGD on 8/28 with LA grade B esophagitis, probable barret's esophagus. gastritis, duodenitis. He was tolerating liquids well prior but was having emesis with liquids likely due to kayexalate. We stopped kayexalate and now he is doing better with liquids.  -Per GI recs: Continue protonix BIDx1 month and then daily, f/up biopsy results. f/up GI Dr. Benson Norway  in 1 month. Repeat EGD 2-3 months.  -ordered strongyloids Ab (he also has eosinophilia concerning for parasitic infection)  - since he is tolerating liquids, will send him home with the recommendation of clear liquids.  Constipation: Resolved. He reported 3 weeks of constipation on presentation that resolved with kayexalate. He had diarrhea yesterday with colonoscopy bowel prep. Colonoscopy on 8/29 revealed left sided divericula and two 5-7 mm sessile/semi-pedunc polyps which were removed but no signs of obstruction.  -Continue monitoring  AKI on CKD stage 3 with non gap metabolic acidosis - Multifactorial. 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 Baseline Cr of 2.7, 5 on presentation that is gradually trending down 3.47->2.95 with IV hydration.  - Nephrology following  - cortisol normal  - f/u aldo/renin level  Hyperkalemia - no EKG changes. K+ 6.5 on admission. s/p kayexalate. Had BM yesterday but K+ remains high  -Lasix 40mg  PO daily, bicarb 1600mg  TID - f/up closely with Dr. Justin Mend nephrology.  Gout - on allopurinol but uric acid remains high at 10.7 but asymptomatic.  -Hold allopurinol since it may worsen his renal function.  Pruritis - With dry skin, though could be 2/2 to uremia.  -Eucerin cream BID for dry skin   Discharge Vitals:   BP 108/75  Pulse 94  Temp(Src) 97.9 F (36.6 C) (Oral)  Resp 16  Ht 5\' 3"  (1.6 m)  Wt 78.4 kg (172 lb 13.5 oz)  BMI 30.63 kg/m2  SpO2 98%  Discharge Labs:  Results for orders placed during the hospital encounter of 01/14/14 (from the past 24 hour(s))  CORTISOL-AM, BLOOD     Status: None   Collection Time    01/17/14  3:25 PM      Result Value Ref Range   Cortisol - AM 19.0  4.3 - 22.4 ug/dL  BASIC METABOLIC PANEL     Status: Abnormal   Collection Time     01/17/14  8:00 PM      Result Value Ref Range   Sodium 144  137 - 147 mEq/L   Potassium 5.1  3.7 - 5.3 mEq/L   Chloride 116 (*) 96 - 112 mEq/L   CO2 15 (*) 19 - 32 mEq/L   Glucose, Bld 89  70 - 99 mg/dL   BUN 38 (*) 6 - 23 mg/dL   Creatinine, Ser 2.91 (*) 0.50 - 1.35 mg/dL   Calcium 8.8  8.4 - 10.5 mg/dL   GFR calc  non Af Amer 21 (*) >90 mL/min   GFR calc Af Amer 25 (*) >90 mL/min   Anion gap 13  5 - 15  BASIC METABOLIC PANEL     Status: Abnormal   Collection Time    01/18/14  9:35 AM      Result Value Ref Range   Sodium 143  137 - 147 mEq/L   Potassium 5.0  3.7 - 5.3 mEq/L   Chloride 114 (*) 96 - 112 mEq/L   CO2 15 (*) 19 - 32 mEq/L   Glucose, Bld 112 (*) 70 - 99 mg/dL   BUN 38 (*) 6 - 23 mg/dL   Creatinine, Ser 2.83 (*) 0.50 - 1.35 mg/dL   Calcium 8.8  8.4 - 10.5 mg/dL   GFR calc non Af Amer 22 (*) >90 mL/min   GFR calc Af Amer 25 (*) >90 mL/min   Anion gap 14  5 - 15  CBC     Status: Abnormal   Collection Time    01/18/14 10:50 AM      Result Value Ref Range   WBC 4.8  4.0 - 10.5 K/uL   RBC 3.61 (*) 4.22 - 5.81 MIL/uL   Hemoglobin 9.8 (*) 13.0 - 17.0 g/dL   HCT 29.8 (*) 39.0 - 52.0 %   MCV 82.5  78.0 - 100.0 fL   MCH 27.1  26.0 - 34.0 pg   MCHC 32.9  30.0 - 36.0 g/dL   RDW 15.4  11.5 - 15.5 %   Platelets 134 (*) 150 - 400 K/uL    Signed: Dellia Nims, MD 01/18/2014, 1:56 PM    Services Ordered on Discharge:  Equipment Ordered on Discharge:

## 2014-01-18 NOTE — Progress Notes (Signed)
I have personally seen and examined this patient and agree with the assessment/plan as outlined above by Heber Rockford DO (PGY2). Improving GI symptoms and able to tolerate breakfast this morning. Renal function slightly better but mild hyperkalemia/metabolic acidosis persists on oral sodium bicarbonate therapy-increase sodium bicarbonate dose to 1300 mg 3 times a day. Continue furosemide 20 mg daily by mouth. If still here tomorrow, we'll update chart with details of follow up with Dr. Justin Mend at renal clinic. Olisa Quesnel K.,MD 01/18/2014 10:22 AM

## 2014-01-19 ENCOUNTER — Encounter (HOSPITAL_COMMUNITY): Payer: Self-pay | Admitting: Gastroenterology

## 2014-01-21 LAB — STRONGYLOIDES ANTIBODY: Strongyloides Ab: NEGATIVE

## 2014-01-22 LAB — ALDOSTERONE + RENIN ACTIVITY W/ RATIO
ALDO / PRA Ratio: 1.2 Ratio (ref 0.9–28.9)
Aldosterone: 4 ng/dL
PRA LC/MS/MS: 3.25 ng/mL/h (ref 0.25–5.82)

## 2014-02-03 ENCOUNTER — Encounter: Payer: Self-pay | Admitting: Internal Medicine

## 2014-02-03 ENCOUNTER — Ambulatory Visit (INDEPENDENT_AMBULATORY_CARE_PROVIDER_SITE_OTHER): Payer: Medicare Other | Admitting: Internal Medicine

## 2014-02-03 VITALS — BP 171/93 | HR 72 | Temp 97.4°F | Ht 63.0 in | Wt 186.6 lb

## 2014-02-03 DIAGNOSIS — N184 Chronic kidney disease, stage 4 (severe): Secondary | ICD-10-CM

## 2014-02-03 DIAGNOSIS — I10 Essential (primary) hypertension: Secondary | ICD-10-CM | POA: Diagnosis not present

## 2014-02-03 DIAGNOSIS — K2270 Barrett's esophagus without dysplasia: Secondary | ICD-10-CM | POA: Diagnosis not present

## 2014-02-03 DIAGNOSIS — D721 Eosinophilia, unspecified: Secondary | ICD-10-CM

## 2014-02-03 DIAGNOSIS — E875 Hyperkalemia: Secondary | ICD-10-CM

## 2014-02-03 LAB — CBC WITH DIFFERENTIAL/PLATELET
BASOS ABS: 0 10*3/uL (ref 0.0–0.1)
BASOS PCT: 0 % (ref 0–1)
EOS PCT: 6 % — AB (ref 0–5)
Eosinophils Absolute: 0.3 10*3/uL (ref 0.0–0.7)
HEMATOCRIT: 26.4 % — AB (ref 39.0–52.0)
HEMOGLOBIN: 8.5 g/dL — AB (ref 13.0–17.0)
LYMPHS PCT: 19 % (ref 12–46)
Lymphs Abs: 0.9 10*3/uL (ref 0.7–4.0)
MCH: 26 pg (ref 26.0–34.0)
MCHC: 32.2 g/dL (ref 30.0–36.0)
MCV: 80.7 fL (ref 78.0–100.0)
MONO ABS: 0.4 10*3/uL (ref 0.1–1.0)
MONOS PCT: 9 % (ref 3–12)
NEUTROS ABS: 3.2 10*3/uL (ref 1.7–7.7)
Neutrophils Relative %: 66 % (ref 43–77)
Platelets: 180 10*3/uL (ref 150–400)
RBC: 3.27 MIL/uL — ABNORMAL LOW (ref 4.22–5.81)
RDW: 16.2 % — ABNORMAL HIGH (ref 11.5–15.5)
WBC: 4.9 10*3/uL (ref 4.0–10.5)

## 2014-02-03 LAB — BASIC METABOLIC PANEL WITH GFR
BUN: 34 mg/dL — AB (ref 6–23)
CHLORIDE: 109 meq/L (ref 96–112)
CO2: 25 mEq/L (ref 19–32)
CREATININE: 2.41 mg/dL — AB (ref 0.50–1.35)
Calcium: 8.5 mg/dL (ref 8.4–10.5)
GFR, EST NON AFRICAN AMERICAN: 27 mL/min — AB
GFR, Est African American: 31 mL/min — ABNORMAL LOW
GLUCOSE: 115 mg/dL — AB (ref 70–99)
Potassium: 4.5 mEq/L (ref 3.5–5.3)
Sodium: 141 mEq/L (ref 135–145)

## 2014-02-03 MED ORDER — PANTOPRAZOLE SODIUM 40 MG PO TBEC: 40.0000 mg | DELAYED_RELEASE_TABLET | Freq: Every day | ORAL | Status: DC

## 2014-02-03 MED ORDER — SODIUM BICARBONATE 650 MG PO TABS: 1300.0000 mg | ORAL_TABLET | Freq: Two times a day (BID) | ORAL | Status: DC

## 2014-02-03 NOTE — Patient Instructions (Addendum)
General Instructions:  1. Please schedule a follow up appointment for 1-2 weeks, after meeting w/ Dr. Justin Mend and Dr. Benson Norway.  2. PLEASE take all medications as prescribed:  Take Sodium Bicarbonate 1300 mg TWICE DAILY (not 3 times daily)  Take Lasix 40 mg DAILY  Take Protonix 40 mg DAILY (not 2 times daily)  STOP Senokot-S  3. If you have worsening of your symptoms or new symptoms arise, please call the clinic [phone]), or go to the ER immediately if symptoms are severe.  DO NOT MISS APPOINTMENTS WITH DR. HUNG AND DR. Justin Mend.   Thank you for bringing your medicines today. This helps [place] keep you safe from mistakes.   Progress Toward Treatment Goals:  Treatment Goal 02/03/2014  Blood pressure deteriorated    Self Care Goals & Plans:  Self Care Goal 02/03/2014  Manage my medications take my medicines as prescribed; bring my medications to every visit; refill my medications on time  Monitor my health -  Eat healthy foods drink diet soda or water instead of juice or soda; eat more vegetables; eat foods that are low in salt; eat baked foods instead of fried foods; eat fruit for snacks and desserts  Be physically active -  Meeting treatment goals maintain the current self-care plan    No flowsheet data found.   Care Management & Community Referrals:  Referral 02/03/2014  Referrals made for care management support -  Referrals made to community resources none

## 2014-02-03 NOTE — Assessment & Plan Note (Addendum)
Seen on most recent EGD, along w/ gastritis and duodenitis. Started on Protonix 40 mg bid w/ meals. However, along with the rest of his medications, he has been very non-compliant since his recent hospital admission. He claims his meds have made him feel "sick recently. I am also under the impression that he does not understand exactly what he is to take despite attempting to explain each medication w/ him at length.  -In order to simplify his medications, will decrease Protonix to 40 mg daily (instead of bid) -Patient is to follow up w/ Dr. Benson Norway on 02/09/14.

## 2014-02-03 NOTE — Progress Notes (Signed)
Patient ID: Luke Dunn, male   DOB: 10-16-1950, 63 y.o.   MRN: [id number]  Case discussed with Dr. Ronnald Ramp at the time of the visit.  We reviewed the resident's history and exam and pertinent patient test results.  I agree with the assessment, diagnosis, and plan of care documented in the resident's note.

## 2014-02-03 NOTE — Assessment & Plan Note (Signed)
BP Readings from Last 3 Encounters:  02/03/14 171/93  01/18/14 118/84  01/18/14 118/84    Lab Results  Component Value Date   NA 143 01/18/2014   K 5.0 01/18/2014   CREATININE 2.83* 01/18/2014    Assessment: Blood pressure control: mildly elevated Progress toward BP goal:  deteriorated Comments: Patient has been non-compliant w/ ALL medications recently including his Lasix 40 mg po qd. He was previously taking Lisinopril and Labetalol prior to his recent hospital admission, however, these were both discontinued. He most definitely needs to be on further BP management, ideally a CCB, however, given his non-compliance, will keep his medications simple for now until his next visit. Patient has a very difficult time understanding the medications he is to take and the purpose of each of them, despite the wonderful help from his aid/nurse [name].   Plan: Medications:  continue current medications; Lasix 40 mg po qd. Emphasized this importance of taking this. Will likely need to add Norvasc 5 mg qd at next visit, or restart Labetalol.  Educational resources provided: brochure Self management tools provided:   Other plans: Patient to return to clinic in 1-2 weeks for BP re-check (after appointment w/ Dr. Justin Mend). At that time, will reassess BP and likely start Norvasc 5 mg qd.

## 2014-02-03 NOTE — Assessment & Plan Note (Signed)
Most recent K 5.0 on discharge from hospital on 01/18/14.  -Recheck BMP today.

## 2014-02-03 NOTE — Assessment & Plan Note (Signed)
During previous clinic visit (prior to hospital admission), patient was noted to have 12% eosinophils on CBC w/ diff. Some concern that this was 2/2 DRES in the setting of Allopurinol use, which has since been discontinued.  -Recheck CBC w/ diff today.

## 2014-02-03 NOTE — Progress Notes (Signed)
Subjective:   Patient ID: Luke Dunn male   DOB: 20-May-1951 63 y.o.   MRN: [id number]  HPI: [name] is a 63 y.o. [place] speaking male w/ PMHx of HTN, Gout, CKD stage 4, and anemia, presents to the clinic today for a follow-up visit. [name] was admitted to the hospital on 01/14/14, discharged on 01/18/14 after acute on chronic renal failure, hyperkalemia, and several GI complaints. Endoscopy and colonoscopy during his admission showed probable Barrett's esophagus w/ gastritis and duondenitis, as well as colonic polyps (w/out high grade dysplasia) and left-sided diverticula. Prior to his admission, his Cr was 4.91 w/ K of 6.5, decreased to 2.83 and 5.0, respectively, prior to discharge. Prior to his admission, his main complaint was his gout and he was recently started on low dose colchicine and allopurinol, which has been discontinued d/t worsening renal failure.   Today, the patient says he is feeling quite well. He denies any nausea, vomiting, abdominal pain, diarrhea, or constipation, all issues for him in the recent pain. He also says he has had no issue w/ urination as well. He denies weakness, dizziness, lightheadedness, tachycardia, or fatigue. [name] does says he has not been taking his medicines at all recently (last time he took them was 4 days ago) as he claims they make him feel nauseous. He does say he has been eating somewhat regular meals and drinking Nepro regularly.   Past Medical History  Diagnosis Date  . Gout   . CKD (chronic kidney disease) stage 4, GFR 15-29 ml/min     Followed by [facility].  Thought to be due to long-standing HTN  . Essential hypertension   . Gout 05/04/2009    Qualifier: Diagnosis of  By: Dyann Kief MD, Clifton James     Current Outpatient Prescriptions  Medication Sig Dispense Refill  . furosemide (LASIX) 40 MG tablet Take 1 tablet (40 mg total) by mouth daily.  90 tablet  3  . hydrocerin (EUCERIN) CREA Apply 2 application topically 2 (two) times  daily.  454 g  0  . Nutritional Supplements (FEEDING SUPPLEMENT, NEPRO CARB STEADY,) LIQD Take 237 mLs by mouth 3 (three) times daily with meals.  30 Can  3  . pantoprazole (PROTONIX) 40 MG tablet Take 1 tablet (40 mg total) by mouth 2 (two) times daily before a meal.  30 tablet  0  . promethazine (PHENERGAN) 12.5 MG tablet Take 1 tablet (12.5 mg total) by mouth every 6 (six) hours as needed for nausea.  30 tablet  0  . senna-docusate (SENOKOT-S) 8.6-50 MG per tablet Take 1 tablet by mouth daily.  30 tablet  1  . sodium bicarbonate 650 MG tablet Take 2 tablets (1,300 mg total) by mouth 3 (three) times daily.  60 tablet  3   No current facility-administered medications for this visit.    Review of Systems: General: Denies fever, chills, diaphoresis, appetite change and fatigue.  Respiratory: Denies SOB, DOE, cough, and wheezing.   Cardiovascular: Denies chest pain and palpitations.  Gastrointestinal: Denies nausea, vomiting, abdominal pain, and diarrhea.  Genitourinary: Denies dysuria, increased frequency, and flank pain. Endocrine: Denies hot or cold intolerance, polyuria, and polydipsia. Musculoskeletal: Denies myalgias, back pain, joint swelling, arthralgias and gait problem.  Skin: Denies pallor, rash and wounds.  Neurological: Denies dizziness, seizures, syncope, weakness, lightheadedness, numbness and headaches.  Psychiatric/Behavioral: Denies mood changes, and sleep disturbances.  Objective:   Physical Exam: Filed Vitals:   02/03/14 1325  BP: 171/93  Pulse: 72  Temp: 97.4 F (36.3 C)  TempSrc: Oral  Height: 5\' 3"  (1.6 m)  Weight: 186 lb 9.6 oz (84.641 kg)  SpO2: 100%    General: [place] speaking male, alert, cooperative, NAD. HEENT: PERRL, EOMI. Moist mucus membranes Neck: Full range of motion without pain, supple, no lymphadenopathy or carotid bruits Lungs: Clear to ascultation bilaterally, normal work of respiration, no wheezes, rales, rhonchi Heart: RRR, no  murmurs, gallops, or rubs Abdomen: Soft, non-tender, non-distended, BS + Extremities: No cyanosis or clubbing. +1 pitting edema. Neurologic: Alert & oriented X3, cranial nerves II-XII intact, strength grossly intact, sensation intact to light touch   Assessment & Plan:   Please see problem based assessment and plan.

## 2014-02-03 NOTE — Assessment & Plan Note (Addendum)
Cr 2.83 on recent hospital discharge. Luke Dunn was discharged on sodium bicarbonate 1300 mg tid for AG metabolic acidosis as well, however, along with the rest of his meds, he has not been taking this recently. Also has not been taking his Lasix 40 mg qd. Emphasized the importance of these two medications w/ respect to his kidneys as well as his K.  -Will decreased sodium bicarbonate to 1300 mg BID for now in order to simplify his medications given his poor understanding of them.  -Continue Lasix 40 mg qd; will likely need to add another anti-HTN medication at his next clinic visit in 1-2 weeks. -Follow up w/ Dr. Justin Mend on 02/10/14 -Recheck BMP

## 2014-02-11 ENCOUNTER — Telehealth: Payer: Self-pay | Admitting: *Deleted

## 2014-02-11 DIAGNOSIS — M1A4790 Other secondary chronic gout, unspecified ankle and foot, without tophus (tophi): Secondary | ICD-10-CM

## 2014-02-11 NOTE — Telephone Encounter (Signed)
Call from [place] for patient.  Patient did not go to appointments with GI or Dr. Justin Mend.  Was reminded on Sunday pm.  Pt had a Gout flare-up and was unable to walk around had to be carried by family.    Was able to used a walker on yesterday refuses to come down stairs.  Patient took herbs that he had gotten from a [employer].  Right knee and ankle pain.  No swelling.  Some improvement on yesterday.  Hoyle Sauer has not spoken to patient today.  Patient wants to know if there is anything he can taken to prevent and help with the episodes.  Patient has a follow up appointment next week does he still need to come since he missed his other appointments?  Hoyle Sauer can be reached at [phone].  Sander Nephew, RN 02/11/2014 10:32 AM

## 2014-02-12 MED ORDER — METHYLPREDNISOLONE 4 MG PO KIT: PACK | ORAL | Status: DC

## 2014-02-12 NOTE — Telephone Encounter (Signed)
Spoke w/ Luke Dunn, patient is having significant pain d/t Gout flare, unable to walk at home and did not make it to his appointments this week d/t pain. Discussed w/ Dr. Beryle Beams, will call in [place] for patient to take until I see him on Tuesday 02/17/14. Will discuss further treatment options at that time w/ respect to controlling future gout flares, ie: Uloric if the patient can afford this.   Luanne Bras, MD 02/12/2014 2:43 PM

## 2014-02-12 NOTE — Telephone Encounter (Signed)
The patient very much does need to come to his clinic appointment and should reschedule both his GI and renal appointments as these are very important, more specifically, the appointment w/ Dr. Justin Mend. I will try and call Hoyle Sauer today as well. Thank you!  Luanne Bras, MD 02/12/2014 7:13 AM

## 2014-02-17 ENCOUNTER — Ambulatory Visit (INDEPENDENT_AMBULATORY_CARE_PROVIDER_SITE_OTHER): Payer: Medicare Other | Admitting: Internal Medicine

## 2014-02-17 ENCOUNTER — Encounter: Payer: Self-pay | Admitting: Internal Medicine

## 2014-02-17 VITALS — BP 164/99 | HR 88 | Temp 98.4°F | Ht 63.0 in | Wt 183.2 lb

## 2014-02-17 DIAGNOSIS — K2270 Barrett's esophagus without dysplasia: Secondary | ICD-10-CM

## 2014-02-17 DIAGNOSIS — M1A00X0 Idiopathic chronic gout, unspecified site, without tophus (tophi): Secondary | ICD-10-CM | POA: Diagnosis not present

## 2014-02-17 DIAGNOSIS — I10 Essential (primary) hypertension: Secondary | ICD-10-CM | POA: Diagnosis not present

## 2014-02-17 DIAGNOSIS — N184 Chronic kidney disease, stage 4 (severe): Secondary | ICD-10-CM

## 2014-02-17 DIAGNOSIS — M1A4790 Other secondary chronic gout, unspecified ankle and foot, without tophus (tophi): Secondary | ICD-10-CM

## 2014-02-17 MED ORDER — FEBUXOSTAT 40 MG PO TABS: 40.0000 mg | ORAL_TABLET | Freq: Every day | ORAL | Status: DC

## 2014-02-17 MED ORDER — AMLODIPINE BESYLATE 5 MG PO TABS: 5.0000 mg | ORAL_TABLET | Freq: Every day | ORAL | Status: DC

## 2014-02-17 NOTE — Patient Instructions (Signed)
General Instructions:  1. Please schedule a follow up appointment for 4 weeks.  Please start taking Norvasc 5 mg daily for blood pressure.  Take steroid dosepak for acute gout flare.   Take Uloric 40 mg daily for gout  2. Please take all medications as prescribed.   3. If you have worsening of your symptoms or new symptoms arise, please call the clinic [phone]), or go to the ER immediately if symptoms are severe.  You have done a great job in taking all your medications. I appreciate it very much. Please continue doing that.    Thank you for bringing your medicines today. This helps [place] keep you safe from mistakes.   Progress Toward Treatment Goals:  Treatment Goal 02/17/2014  Blood pressure deteriorated    Self Care Goals & Plans:  Self Care Goal 02/17/2014  Manage my medications take my medicines as prescribed; bring my medications to every visit; refill my medications on time  Monitor my health -  Eat healthy foods drink diet soda or water instead of juice or soda; eat more vegetables; eat foods that are low in salt; eat baked foods instead of fried foods; eat fruit for snacks and desserts  Be physically active -  Meeting treatment goals maintain the current self-care plan    No flowsheet data found.   Care Management & Community Referrals:  Referral 02/17/2014  Referrals made for care management support none needed  Referrals made to community resources none

## 2014-02-17 NOTE — Progress Notes (Signed)
Subjective:   Patient ID: Luke Dunn male   DOB: 1950/07/02 63 y.o.   MRN: [id number]  HPI: [name] is a 63 y.o. [place] speaking male w/ PMHx of HTN, Gout, CKD stage 4, and anemia, presents today for follow up appointment. Patient was last seen in the clinic on 02/03/14 for hospital follow up involving worsening renal failure. He was to follow up w/ both Dr. Justin Mend and Dr. Benson Norway over the past 2 weeks, however, he was unable to make both of these appointments 2/2 severe acute gout flare limiting his mobility. On 02/12/14, a Medrol dosepak was called in for his acute gout (not able to take NSAIDS or Colchicine given his CKD), however, pharmacy gave him a refill for his Protonix instead of his steroid prescription.  Today, patient claims his pain is better controlled, able to ambulate, but still w/ discomfort in his right knee, ankle, and great toe. He claims this pain is identical to the pain he has had w/ previous gout flares. He states that he started taking an asian remedy, which according to his home RN has dexamethasone in it, and says that this has helped him. He denies any systemic symptoms; no fever, chills, nausea, vomiting, diarrhea, or constipation.  Patient has rescheduled his appointments w/ Dr. Justin Mend (03/24/14) and Dr. Benson Norway (02/23/14).    Past Medical History  Diagnosis Date  . Gout   . CKD (chronic kidney disease) stage 4, GFR 15-29 ml/min     Followed by [facility].  Thought to be due to long-standing HTN  . Essential hypertension   . Gout 05/04/2009    Qualifier: Diagnosis of  By: Dyann Kief MD, Clifton James     Current Outpatient Prescriptions  Medication Sig Dispense Refill  . furosemide (LASIX) 40 MG tablet Take 1 tablet (40 mg total) by mouth daily.  90 tablet  3  . hydrocerin (EUCERIN) CREA Apply 2 application topically 2 (two) times daily.  454 g  0  . methylPREDNISolone (MEDROL DOSEPAK) 4 MG tablet follow package directions  21 tablet  0  . Nutritional Supplements (FEEDING  SUPPLEMENT, NEPRO CARB STEADY,) LIQD Take 237 mLs by mouth 3 (three) times daily with meals.  30 Can  3  . pantoprazole (PROTONIX) 40 MG tablet Take 1 tablet (40 mg total) by mouth daily.  30 tablet  3  . promethazine (PHENERGAN) 12.5 MG tablet Take 1 tablet (12.5 mg total) by mouth every 6 (six) hours as needed for nausea.  30 tablet  0  . sodium bicarbonate 650 MG tablet Take 2 tablets (1,300 mg total) by mouth 2 (two) times daily.  60 tablet  3   No current facility-administered medications for this visit.    Review of Systems: General: Denies fever, chills, diaphoresis, appetite change and fatigue.  Respiratory: Denies SOB, DOE, cough, and wheezing.   Cardiovascular: Denies chest pain and palpitations.  Gastrointestinal: Denies nausea, vomiting, abdominal pain, and diarrhea.  Genitourinary: Denies dysuria, increased frequency, and flank pain. Endocrine: Denies hot or cold intolerance, polyuria, and polydipsia. Musculoskeletal: Positive for right knee, ankle, and foot pain. Denies myalgias, back pain, joint swelling, and gait problem.  Skin: Denies pallor, rash and wounds.  Neurological: Denies dizziness, seizures, syncope, weakness, lightheadedness, numbness and headaches.  Psychiatric/Behavioral: Denies mood changes, and sleep disturbances.  Objective:   Physical Exam: Filed Vitals:   02/17/14 1328  BP: 164/99  Pulse: 88  Temp: 98.4 F (36.9 C)  TempSrc: Oral  Height: 5\' 3"  (1.6 m)  Weight: 183 lb 3.2 oz (83.099 kg)  SpO2: 100%    General: [place] speaking male, alert, cooperative, NAD. HEENT: PERRL, EOMI. Moist mucus membranes Neck: Full range of motion without pain, supple, no lymphadenopathy or carotid bruits Lungs: Clear to ascultation bilaterally, normal work of respiration, no wheezes, rales, rhonchi Heart: RRR, no murmurs, gallops, or rubs Abdomen: Soft, non-tender, non-distended, BS +  Extremities: No cyanosis or clubbing. +1 pitting edema. Tenderness to  palpation over right knee, ankle, and great toe. No significant increase in warmth or erythema.  Neurologic: Alert & oriented X3, cranial nerves II-XII intact, strength grossly intact, sensation intact to light touch    Assessment & Plan:   Please see problem based assessment and plan.

## 2014-02-18 ENCOUNTER — Encounter: Payer: Self-pay | Admitting: Internal Medicine

## 2014-02-18 NOTE — Assessment & Plan Note (Signed)
No complaints of dyspepsia at this time.  -To follow up w/ Dr. Benson Norway on 02/23/14.  -States he has been complaint w/ Protonix.

## 2014-02-18 NOTE — Progress Notes (Signed)
INTERNAL MEDICINE TEACHING ATTENDING ADDENDUM - Natalye Kott, MD: I reviewed and discussed at the time of visit with the resident Dr. Jones, the patient's medical history, physical examination, diagnosis and results of pertinent tests and treatment and I agree with the patient's care as documented.  

## 2014-02-18 NOTE — Assessment & Plan Note (Signed)
Recent acute gout flare involving his right knee, ankle, and great toe. States this limited his ability to leave his house. Called in Medrol Dosepak, but a refill error occurred at the pharmacy and he did not receive this. Also had adverse effects w/ Allopurinol previously (eosinophilia, renal insufficiency). -Patient instructed to take Medrol Dosepak -Start Uloric 40 mg po qd. Possibly will not be covered by insurance, will attempt to get this authorized as he has previously failed allopurinol.  -Patient to return in 4 weeks. At that time will repeat uric acid in order to adjust Uloric dosage (if able to obtain by then).

## 2014-02-18 NOTE — Assessment & Plan Note (Signed)
BP Readings from Last 3 Encounters:  02/17/14 164/99  02/03/14 171/93  01/18/14 118/84    Lab Results  Component Value Date   NA 141 02/03/2014   K 4.5 02/03/2014   CREATININE 2.41* 02/03/2014    Assessment: Blood pressure control: mildly elevated Progress toward BP goal:  deteriorated Comments: Patient w/ recent h/o non-compliance w/ medication, but states he has been taking all of his medications recently. D/t this, he had been taken off of other medications, now only on Lasix 40 mg qd which has has been taking daily (confirmed by med reconciliation and HHRN). BP elevated today.   Plan: Medications:  continue current medications; Lasix 40 mg po qd. ADD Norvasc 5 mg po qd for now. Can increase dose at later date if needed and if patient tolerates this.  Other plans: Patient to follow up in 4 weeks. Also will see Dr. Justin Mend on 03/24/14.

## 2014-02-18 NOTE — Assessment & Plan Note (Signed)
Appointment rescheduled w/ Dr. Justin Mend 03/24/14.

## 2014-02-23 DIAGNOSIS — K635 Polyp of colon: Secondary | ICD-10-CM | POA: Diagnosis not present

## 2014-02-23 DIAGNOSIS — K208 Other esophagitis: Secondary | ICD-10-CM | POA: Diagnosis not present

## 2014-03-02 ENCOUNTER — Other Ambulatory Visit (HOSPITAL_COMMUNITY): Payer: Self-pay | Admitting: Internal Medicine

## 2014-03-03 ENCOUNTER — Telehealth: Payer: Self-pay | Admitting: *Deleted

## 2014-03-03 DIAGNOSIS — K210 Gastro-esophageal reflux disease with esophagitis: Secondary | ICD-10-CM | POA: Diagnosis not present

## 2014-03-03 DIAGNOSIS — K229 Disease of esophagus, unspecified: Secondary | ICD-10-CM | POA: Diagnosis not present

## 2014-03-03 DIAGNOSIS — K449 Diaphragmatic hernia without obstruction or gangrene: Secondary | ICD-10-CM | POA: Diagnosis not present

## 2014-03-03 DIAGNOSIS — K2270 Barrett's esophagus without dysplasia: Secondary | ICD-10-CM | POA: Diagnosis not present

## 2014-03-03 NOTE — Telephone Encounter (Signed)
PA request received from pt's pharmacy for Uloric.  PA was submitted on 02/19/2014 and sent for "review".  Pt has CKD and no longer a candidate for colchicine and allopurinol 2/2 worsening renal function-but request was still denied.  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 Appeal faxed to [facility] for coverage determination.  Determination sent for "review"- 48-72 hours for a decision.Despina Hidden Cassady10/13/20152:02 PM       [phone] Member ID# [id number]   (Ref# [id number])

## 2014-03-24 ENCOUNTER — Telehealth: Payer: Self-pay | Admitting: *Deleted

## 2014-03-24 NOTE — Telephone Encounter (Signed)
Pt's congregational nurse  Calls and states pt is having a horrible flare up of gout, so bad he cannot get out of bed, he is taking the uloric but it does not seem to be helping yet. She does not know if he will be able to come to his appt 11/5. Is there something you can call in for him? You may call her at [phone], carolyn

## 2014-03-25 ENCOUNTER — Other Ambulatory Visit: Payer: Self-pay | Admitting: Internal Medicine

## 2014-03-25 DIAGNOSIS — M1A0720 Idiopathic chronic gout, left ankle and foot, without tophus (tophi): Secondary | ICD-10-CM

## 2014-03-25 MED ORDER — PREDNISONE 20 MG PO TABS: 20.0000 mg | ORAL_TABLET | Freq: Every day | ORAL | Status: DC

## 2014-03-25 NOTE — Telephone Encounter (Signed)
Discussed w/ Hoyle Sauer, gave patient Prednisone 40 mg po qd for 5 days for acute gout flare. Scheduled for follow up appointment tomorrow, will discuss adjusting Uloric during his visit.

## 2014-03-26 ENCOUNTER — Encounter: Payer: Self-pay | Admitting: Internal Medicine

## 2014-03-26 ENCOUNTER — Ambulatory Visit (INDEPENDENT_AMBULATORY_CARE_PROVIDER_SITE_OTHER): Payer: Medicare Other | Admitting: Internal Medicine

## 2014-03-26 VITALS — BP 147/96 | Temp 97.7°F | Ht 63.0 in | Wt 179.2 lb

## 2014-03-26 DIAGNOSIS — M1A4790 Other secondary chronic gout, unspecified ankle and foot, without tophus (tophi): Secondary | ICD-10-CM

## 2014-03-26 DIAGNOSIS — M10479 Other secondary gout, unspecified ankle and foot: Secondary | ICD-10-CM | POA: Diagnosis not present

## 2014-03-26 DIAGNOSIS — E875 Hyperkalemia: Secondary | ICD-10-CM | POA: Diagnosis not present

## 2014-03-26 LAB — COMPLETE METABOLIC PANEL WITH GFR
ALK PHOS: 90 U/L (ref 39–117)
ALT: <8 U/L (ref 0–53)
AST: 17 U/L (ref 0–37)
Albumin: 3.7 g/dL (ref 3.5–5.2)
BUN: 57 mg/dL — AB (ref 6–23)
CO2: 23 meq/L (ref 19–32)
CREATININE: 2.9 mg/dL — AB (ref 0.50–1.35)
Calcium: 9.2 mg/dL (ref 8.4–10.5)
Chloride: 106 mEq/L (ref 96–112)
GFR, Est African American: 25 mL/min — ABNORMAL LOW
GFR, Est Non African American: 22 mL/min — ABNORMAL LOW
Glucose, Bld: 133 mg/dL — ABNORMAL HIGH (ref 70–99)
Potassium: 5.5 mEq/L — ABNORMAL HIGH (ref 3.5–5.3)
SODIUM: 139 meq/L (ref 135–145)
Total Bilirubin: 0.3 mg/dL (ref 0.2–1.2)
Total Protein: 7.7 g/dL (ref 6.0–8.3)

## 2014-03-26 MED ORDER — FEBUXOSTAT 40 MG PO TABS: 80.0000 mg | ORAL_TABLET | Freq: Every day | ORAL | Status: DC

## 2014-03-26 MED ORDER — PREDNISONE 20 MG PO TABS: 20.0000 mg | ORAL_TABLET | Freq: Every day | ORAL | Status: DC

## 2014-03-26 NOTE — Patient Instructions (Signed)
General Instructions:  1. Please schedule a follow up appointment for 3-4 weeks.   2. Please take all medications as prescribed. Please continue Prednisone 40 mg daily until gone, then take 5 MORE days of 20 mg daily.   Increase Uloric to 80 mg daily.   3. If you have worsening of your symptoms or new symptoms arise, please call the clinic [phone]), or go to the ER immediately if symptoms are severe.    Thank you for bringing your medicines today. This helps [place] keep you safe from mistakes.   Progress Toward Treatment Goals:  Treatment Goal 02/17/2014  Blood pressure deteriorated    Self Care Goals & Plans:  Self Care Goal 02/17/2014  Manage my medications take my medicines as prescribed; bring my medications to every visit; refill my medications on time  Monitor my health -  Eat healthy foods drink diet soda or water instead of juice or soda; eat more vegetables; eat foods that are low in salt; eat baked foods instead of fried foods; eat fruit for snacks and desserts  Be physically active -  Meeting treatment goals maintain the current self-care plan    No flowsheet data found.   Care Management & Community Referrals:  Referral 02/17/2014  Referrals made for care management support none needed  Referrals made to community resources none

## 2014-03-26 NOTE — Progress Notes (Signed)
Subjective:   Patient ID: Luke Dunn male   DOB: [birth year] 63 y.o.   MRN: [id number]  HPI: Luke Dunn is a 63 y.o. [place] speaking male w/ PMHx of HTN, Gout, CKD stage 4, and anemia, 63 presents today for follow up appointment. Over the past 3-5 days, the patient has seemed to have another acute gout flare of his right ankle and foot, as well as his right hand and fingers. He was last seen at the beginning of October and was given a steroid taper at that time for an acute gout flare and started on Uloric 40 mg qd (which was started on 03/04/14). He claims he has been compliant w/ the Uloric at home. He now has very painful right ankle and right hand, his hand being his most significant complaint. No fever, chills, nausea, abdominal pain.    Past Medical History  Diagnosis Date  . Gout   . CKD (chronic kidney disease) stage 4, GFR 15-29 ml/min     Followed by [facility].  Thought to be due to long-standing HTN  . Essential hypertension   . Gout 05/04/2009    Qualifier: Diagnosis of  By: Dyann Kief MD, Clifton James     Current Outpatient Prescriptions  Medication Sig Dispense Refill  . amLODipine (NORVASC) 5 MG tablet Take 1 tablet (5 mg total) by mouth daily. 30 tablet 5  . febuxostat (ULORIC) 40 MG tablet Take 2 tablets (80 mg total) by mouth daily. 60 tablet 5  . furosemide (LASIX) 40 MG tablet Take 1 tablet (40 mg total) by mouth daily. 90 tablet 3  . hydrocerin (EUCERIN) CREA Apply 2 application topically 2 (two) times daily. 454 g 0  . Nutritional Supplements (FEEDING SUPPLEMENT, NEPRO CARB STEADY,) LIQD Take 237 mLs by mouth 3 (three) times daily with meals. 30 Can 3  . pantoprazole (PROTONIX) 40 MG tablet Take 1 tablet (40 mg total) by mouth daily. 30 tablet 3  . predniSONE (DELTASONE) 20 MG tablet Take 1 tablet (20 mg total) by mouth daily with breakfast. Take 40 mg daily for 5 days 10 tablet 0  . predniSONE (DELTASONE) 20 MG tablet Take 1 tablet (20 mg total) by mouth daily. 5 tablet  0  . promethazine (PHENERGAN) 12.5 MG tablet Take 1 tablet (12.5 mg total) by mouth every 6 (six) hours as needed for nausea. 30 tablet 0  . sodium bicarbonate 650 MG tablet Take 2 tablets (1,300 mg total) by mouth 2 (two) times daily. 60 tablet 2   No current facility-administered medications for this visit.    Review of Systems: General: Denies fever, chills, diaphoresis, appetite change and fatigue.  Respiratory: Denies SOB, DOE, cough, and wheezing.   Cardiovascular: Denies chest pain and palpitations.  Gastrointestinal: Denies nausea, vomiting, abdominal pain, and diarrhea.  Genitourinary: Denies dysuria, increased frequency, and flank pain. Endocrine: Denies hot or cold intolerance, polyuria, and polydipsia. Musculoskeletal: Positive for right hand, ankle, and foot pain. Denies myalgias, back pain, joint swelling, and gait problem.  Skin: Denies pallor, rash and wounds.  Neurological: Denies dizziness, seizures, syncope, weakness, lightheadedness, numbness and headaches.  Psychiatric/Behavioral: Denies mood changes, and sleep disturbances.  Objective:   Physical Exam: Filed Vitals:   03/26/14 1502  BP: 147/96  Temp: 97.7 F (36.5 C)  TempSrc: Oral  Height: 5\' 3"  (1.6 m)  Weight: 179 lb 3.2 oz (81.285 kg)  SpO2: 100%    General: [place] speaking male, alert, cooperative, NAD. HEENT: PERRL, EOMI. Moist mucus membranes Neck: Full range  of motion without pain, supple, no lymphadenopathy or carotid bruits Lungs: Clear to ascultation bilaterally, normal work of respiration, no wheezes, rales, rhonchi Heart: RRR, no murmurs, gallops, or rubs Abdomen: Soft, non-tender, non-distended, BS +  Extremities: No cyanosis or clubbing. +1 pitting edema. Tenderness to palpation over right hand, ankle, and great toe. No significant increase in warmth or erythema. 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 Neurologic: Alert & oriented X3, cranial nerves II-XII intact,  strength grossly intact, sensation intact to light touch    Assessment & Plan:   Please see problem based assessment and plan.

## 2014-03-26 NOTE — Assessment & Plan Note (Signed)
Presents today w/ another acute gout flare. Called in Rx for Prednisone 40 mg daily for 5 days starting yesterday. Today he is significantly improved, still w/ right hand pain. During his last visit, patient also had acute gout flare, given steroid taper at that time and started Uloric 40 mg daily (initiated on 03/04/14).  -Continue Prednisone 40 mg daily for 3 more days; then taper dose to 20 mg daily for 5 more days. -Increase Uloric to 80 mg daily -Patient to return to clinic in 3-4 week for follow up. Will recheck uric acid at that time, hopefully he will not be in an acute flare again -Check CMP today to reassess renal and liver function

## 2014-03-27 NOTE — Assessment & Plan Note (Signed)
ADDENDUM: Patient w/ K borderline elevated at 5.5. Will have patient return on Monday 03/30/14 for lab re-check.

## 2014-03-30 ENCOUNTER — Other Ambulatory Visit: Payer: Medicare Other

## 2014-03-30 ENCOUNTER — Other Ambulatory Visit: Payer: Self-pay | Admitting: Internal Medicine

## 2014-03-30 DIAGNOSIS — E875 Hyperkalemia: Secondary | ICD-10-CM

## 2014-03-31 ENCOUNTER — Other Ambulatory Visit (INDEPENDENT_AMBULATORY_CARE_PROVIDER_SITE_OTHER): Payer: Medicare Other

## 2014-03-31 DIAGNOSIS — E875 Hyperkalemia: Secondary | ICD-10-CM | POA: Diagnosis not present

## 2014-03-31 LAB — BASIC METABOLIC PANEL WITH GFR
BUN: 35 mg/dL — ABNORMAL HIGH (ref 6–23)
CHLORIDE: 103 meq/L (ref 96–112)
CO2: 25 meq/L (ref 19–32)
CREATININE: 2.31 mg/dL — AB (ref 0.50–1.35)
Calcium: 9 mg/dL (ref 8.4–10.5)
GFR, Est African American: 33 mL/min — ABNORMAL LOW
GFR, Est Non African American: 29 mL/min — ABNORMAL LOW
Glucose, Bld: 151 mg/dL — ABNORMAL HIGH (ref 70–99)
POTASSIUM: 4.8 meq/L (ref 3.5–5.3)
SODIUM: 144 meq/L (ref 135–145)

## 2014-03-31 NOTE — Progress Notes (Signed)
Interpreter for [employer] was Modena Nunnery on 03/31/14 [place]

## 2014-04-01 NOTE — Progress Notes (Signed)
Internal Medicine Clinic Attending  Case discussed with Dr. Jones soon after the resident saw the patient.  We reviewed the resident's history and exam and pertinent patient test results.  I agree with the assessment, diagnosis, and plan of care documented in the resident's note. 

## 2014-04-21 ENCOUNTER — Ambulatory Visit (INDEPENDENT_AMBULATORY_CARE_PROVIDER_SITE_OTHER): Payer: Medicare Other | Admitting: Internal Medicine

## 2014-04-21 ENCOUNTER — Encounter: Payer: Self-pay | Admitting: Internal Medicine

## 2014-04-21 VITALS — BP 121/74 | HR 89 | Temp 97.9°F | Ht 63.0 in | Wt 182.1 lb

## 2014-04-21 DIAGNOSIS — M1000 Idiopathic gout, unspecified site: Secondary | ICD-10-CM

## 2014-04-21 DIAGNOSIS — I10 Essential (primary) hypertension: Secondary | ICD-10-CM | POA: Diagnosis not present

## 2014-04-21 DIAGNOSIS — M1A0720 Idiopathic chronic gout, left ankle and foot, without tophus (tophi): Secondary | ICD-10-CM

## 2014-04-21 DIAGNOSIS — M109 Gout, unspecified: Secondary | ICD-10-CM | POA: Insufficient documentation

## 2014-04-21 LAB — URIC ACID: URIC ACID, SERUM: 4.7 mg/dL (ref 4.0–7.8)

## 2014-04-21 NOTE — Assessment & Plan Note (Signed)
Patient w/ extensive h/o Gout, involving multiple joints, most commonly his left foot, both knees, and right wrist. Today, the patient complains of some mild knee pain bilaterally, as well as some right wrist pain, but says this is much better than it has been in the recent past. The patient recently finished taking a Prednisone taper for acute flare which had helped his pain significantly. Since that time he has been taking Uloric 80 mg daily (increased from 40 mg daily). Have not previously been able to check a uric acid level as the patient typically presents to the clinic during an acute flare. -Check uric acid today; if >6.0, will further increase dose of Uloric.  -Advised use of Tylenol 1000 mg tid for continued, mild pain at this time.

## 2014-04-21 NOTE — Progress Notes (Signed)
Subjective:   Patient ID: Luke Dunn male   DOB: 1951-01-26 63 y.o.   MRN: [id number]  HPI: Luke Dunn is a 63 y.o. [place] speaking male w/ PMHx of HTN, Gout, CKD stage 4, and anemia, presents today for follow up appointment. Luke Dunn has had significant issues w/ acute gout flares in the very recent past, requiring systemic steroids (polyarticular, h/o CKD). Today, the patient is having some pain, mostly in his knees and right wrist, but does not appear to be in an acute flare. He says he has been doing much better on Uloric 80 mg daily.  The patient also had potassium of 5.5 at his most recent clinic visit, but on repeating the labs 2 days later, the K had returned to normal. Luke Dunn has a h/o CKD and is scheduled to follow w/ Dr. Justin Mend on 05/01/14 for further management of his renal issues. He has had to reschedule his appointment 2 times in the recent past d/t significant pain caused by gout flare.   Past Medical History  Diagnosis Date  . Gout   . CKD (chronic kidney disease) stage 4, GFR 15-29 ml/min     Followed by [facility].  Thought to be due to long-standing HTN  . Essential hypertension   . Gout 05/04/2009    Qualifier: Diagnosis of  By: Dyann Kief MD, Clifton James     Current Outpatient Prescriptions  Medication Sig Dispense Refill  . amLODipine (NORVASC) 5 MG tablet Take 1 tablet (5 mg total) by mouth daily. 30 tablet 5  . febuxostat (ULORIC) 40 MG tablet Take 2 tablets (80 mg total) by mouth daily. 60 tablet 5  . furosemide (LASIX) 40 MG tablet Take 1 tablet (40 mg total) by mouth daily. 90 tablet 3  . hydrocerin (EUCERIN) CREA Apply 2 application topically 2 (two) times daily. 454 g 0  . Nutritional Supplements (FEEDING SUPPLEMENT, NEPRO CARB STEADY,) LIQD Take 237 mLs by mouth 3 (three) times daily with meals. 30 Can 3  . pantoprazole (PROTONIX) 40 MG tablet Take 1 tablet (40 mg total) by mouth daily. 30 tablet 3  . predniSONE (DELTASONE) 20 MG tablet Take 1 tablet (20 mg  total) by mouth daily with breakfast. Take 40 mg daily for 5 days 10 tablet 0  . predniSONE (DELTASONE) 20 MG tablet Take 1 tablet (20 mg total) by mouth daily. 5 tablet 0  . promethazine (PHENERGAN) 12.5 MG tablet Take 1 tablet (12.5 mg total) by mouth every 6 (six) hours as needed for nausea. 30 tablet 0  . sodium bicarbonate 650 MG tablet Take 2 tablets (1,300 mg total) by mouth 2 (two) times daily. 60 tablet 2   No current facility-administered medications for this visit.    Review of Systems: General: Denies fever, chills, diaphoresis, appetite change and fatigue.  Respiratory: Denies SOB, DOE, cough, and wheezing.   Cardiovascular: Denies chest pain and palpitations.  Gastrointestinal: Denies nausea, vomiting, abdominal pain, and diarrhea.  Genitourinary: Denies dysuria, increased frequency, and flank pain. Endocrine: Denies hot or cold intolerance, polyuria, and polydipsia. Musculoskeletal: Positive for right wrist, and bilateral knee pain. Denies myalgias, back pain, joint swelling, and gait problem.  Skin: Denies pallor, rash and wounds.  Neurological: Denies dizziness, seizures, syncope, weakness, lightheadedness, numbness and headaches.  Psychiatric/Behavioral: Denies mood changes, and sleep disturbances.  Objective:   Physical Exam: Filed Vitals:   04/21/14 1115  BP: 121/74  Pulse: 89  Temp: 97.9 F (36.6 C)  TempSrc: Oral  Height: 5'  3" (1.6 m)  Weight: 182 lb 1.6 oz (82.6 kg)  SpO2: 100%    General: [place] speaking male, alert, cooperative, NAD. HEENT: PERRL, EOMI. Moist mucus membranes Neck: Full range of motion without pain, supple, no lymphadenopathy or carotid bruits Lungs: Clear to ascultation bilaterally, normal work of respiration, no wheezes, rales, rhonchi Heart: RRR, no murmurs, gallops, or rubs Abdomen: Soft, non-tender, non-distended, BS +  Extremities: No cyanosis or clubbing. Trace pitting edema. Only mild tenderness to palpation over right  wrist and knees bilaterally. No significant increase in warmth or erythema.  Neurologic: Alert & oriented X3, cranial nerves II-XII intact, strength grossly intact, sensation intact to light touch    Assessment & Plan:   Please see problem based assessment and plan.

## 2014-04-21 NOTE — Assessment & Plan Note (Addendum)
BP Readings from Last 3 Encounters:  04/21/14 121/74  03/26/14 147/96  02/17/14 164/99    Lab Results  Component Value Date   NA 144 03/31/2014   K 4.8 03/31/2014   CREATININE 2.31* 03/31/2014    Assessment: Comments: BP well controlled today, on Lasix 40 mg daily + NOrvasc 5 mg daily  Plan: Medications:  continue current medications Educational resources provided:   Self management tools provided:   Other plans: RTC in 1-2 months after follow up w/ nephrology

## 2014-04-21 NOTE — Progress Notes (Signed)
Internal Medicine Clinic Attending  Case discussed with Dr. Jones soon after the resident saw the patient.  We reviewed the resident's history and exam and pertinent patient test results.  I agree with the assessment, diagnosis, and plan of care documented in the resident's note. 

## 2014-04-21 NOTE — Patient Instructions (Addendum)
General Instructions:  1. Please schedule a follow up appointment for 1-2 months.  2. Please take all medications as prescribed.   Continue taking Uloric 80 mg daily.    Take Tylenol 1000 mg up to three times daily for pain  PLEASE SEE DR. Justin Mend WITH [facility] (05/01/14)  3. If you have worsening of your symptoms or new symptoms arise, please call the clinic [phone]), or go to the ER immediately if symptoms are severe.  You have done a great job in taking all your medications. I appreciate it very much. Please continue doing that.   Thank you for bringing your medicines today. This helps [place] keep you safe from mistakes.   Progress Toward Treatment Goals:  Treatment Goal 02/17/2014  Blood pressure deteriorated    Self Care Goals & Plans:  Self Care Goal 02/17/2014  Manage my medications take my medicines as prescribed; bring my medications to every visit; refill my medications on time  Monitor my health -  Eat healthy foods drink diet soda or water instead of juice or soda; eat more vegetables; eat foods that are low in salt; eat baked foods instead of fried foods; eat fruit for snacks and desserts  Be physically active -  Meeting treatment goals maintain the current self-care plan    No flowsheet data found.   Care Management & Community Referrals:  Referral 02/17/2014  Referrals made for care management support none needed  Referrals made to community resources none

## 2014-05-01 DIAGNOSIS — N189 Chronic kidney disease, unspecified: Secondary | ICD-10-CM | POA: Diagnosis not present

## 2014-05-01 DIAGNOSIS — N183 Chronic kidney disease, stage 3 (moderate): Secondary | ICD-10-CM | POA: Diagnosis not present

## 2014-05-01 DIAGNOSIS — N2581 Secondary hyperparathyroidism of renal origin: Secondary | ICD-10-CM | POA: Diagnosis not present

## 2014-05-01 DIAGNOSIS — E785 Hyperlipidemia, unspecified: Secondary | ICD-10-CM | POA: Diagnosis not present

## 2014-05-01 DIAGNOSIS — D631 Anemia in chronic kidney disease: Secondary | ICD-10-CM | POA: Diagnosis not present

## 2014-05-26 ENCOUNTER — Ambulatory Visit (INDEPENDENT_AMBULATORY_CARE_PROVIDER_SITE_OTHER): Payer: Medicare Other | Admitting: Internal Medicine

## 2014-05-26 ENCOUNTER — Encounter: Payer: Self-pay | Admitting: Internal Medicine

## 2014-05-26 VITALS — BP 135/88 | HR 98 | Temp 98.2°F | Ht 63.0 in | Wt 187.7 lb

## 2014-05-26 DIAGNOSIS — G8929 Other chronic pain: Secondary | ICD-10-CM | POA: Insufficient documentation

## 2014-05-26 DIAGNOSIS — I10 Essential (primary) hypertension: Secondary | ICD-10-CM

## 2014-05-26 DIAGNOSIS — M1000 Idiopathic gout, unspecified site: Secondary | ICD-10-CM

## 2014-05-26 DIAGNOSIS — I129 Hypertensive chronic kidney disease with stage 1 through stage 4 chronic kidney disease, or unspecified chronic kidney disease: Secondary | ICD-10-CM

## 2014-05-26 DIAGNOSIS — N184 Chronic kidney disease, stage 4 (severe): Secondary | ICD-10-CM | POA: Diagnosis not present

## 2014-05-26 DIAGNOSIS — M25561 Pain in right knee: Secondary | ICD-10-CM

## 2014-05-26 DIAGNOSIS — M25562 Pain in left knee: Secondary | ICD-10-CM

## 2014-05-26 DIAGNOSIS — M2550 Pain in unspecified joint: Secondary | ICD-10-CM

## 2014-05-26 MED ORDER — SODIUM BICARBONATE 650 MG PO TABS: 1300.0000 mg | ORAL_TABLET | Freq: Two times a day (BID) | ORAL | Status: DC

## 2014-05-26 MED ORDER — TRAMADOL HCL 50 MG PO TABS: 50.0000 mg | ORAL_TABLET | Freq: Two times a day (BID) | ORAL | Status: DC | PRN

## 2014-05-26 NOTE — Patient Instructions (Signed)
General Instructions:  1. Please schedule a follow up appointment for 3 months.  Please follow up with Physical Therapy for joint pain.  2. Please take all medications as prescribed.   Take Tramadol 50 mg 1-2 times daily ONLY WHEN NEEDED for joint pain.   Continue Uloric and other medications as previously discussed.   Follow up w/ the vascular doctor regarding access placement for the possibility of dialysis in the next 1-10 years.   3. If you have worsening of your symptoms or new symptoms arise, please call the clinic [phone]), or go to the ER immediately if symptoms are severe.  You have done a great job in taking all your medications. I appreciate it very much. Please continue doing that.   Thank you for bringing your medicines today. This helps [place] keep you safe from mistakes.   Progress Toward Treatment Goals:  Treatment Goal 05/26/2014  Blood pressure at goal    Self Care Goals & Plans:  Self Care Goal 05/26/2014  Manage my medications take my medicines as prescribed; bring my medications to every visit  Monitor my health -  Eat healthy foods drink diet soda or water instead of juice or soda; eat more vegetables; eat foods that are low in salt; eat baked foods instead of fried foods; eat fruit for snacks and desserts  Be physically active -  Meeting treatment goals maintain the current self-care plan    No flowsheet data found.   Care Management & Community Referrals:  Referral 05/26/2014  Referrals made for care management support none needed  Referrals made to community resources none

## 2014-05-26 NOTE — Progress Notes (Signed)
Subjective:   Patient ID: Luke Dunn male   DOB: July 17, 1950 64 y.o.   MRN: [id number]  HPI: Luke Dunn is a 64 y.o. [place] speaking male w/ PMHx of HTN, Gout, CKD stage 4, and anemia, presents today for follow up appointment. Luke Dunn has had significant issues w/ acute gout flares in the very recent past, requiring systemic steroids (polyarticular, h/o CKD). Today, he is doing quite well, is only having mild pain, mostly isolated to his knees bilaterally. During his most recent clinic visit, uric acid level was checked, shown to be 4.7. Patient says that some days cause him more pain than others, but today he feels particularly well. He states this pain is frequently worsened by overactivity.  Luke Dunn also recently followed up w/ Dr. Justin Mend w/ nephrology who had briefly discussed the possibility of obtaining vascular access for future HD. He was quite confused by this concept during his appointment today and the purpose of access placement at this time was explained to him in depth. He currently has no further complaints.    Past Medical History  Diagnosis Date  . Gout   . CKD (chronic kidney disease) stage 4, GFR 15-29 ml/min     Followed by [facility].  Thought to be due to long-standing HTN  . Essential hypertension   . Gout 05/04/2009    Qualifier: Diagnosis of  By: Dyann Kief MD, Clifton James     Current Outpatient Prescriptions  Medication Sig Dispense Refill  . amLODipine (NORVASC) 5 MG tablet Take 1 tablet (5 mg total) by mouth daily. 30 tablet 5  . febuxostat (ULORIC) 40 MG tablet Take 2 tablets (80 mg total) by mouth daily. 60 tablet 5  . furosemide (LASIX) 40 MG tablet Take 1 tablet (40 mg total) by mouth daily. 90 tablet 3  . hydrocerin (EUCERIN) CREA Apply 2 application topically 2 (two) times daily. 454 g 0  . Nutritional Supplements (FEEDING SUPPLEMENT, NEPRO CARB STEADY,) LIQD Take 237 mLs by mouth 3 (three) times daily with meals. 30 Can 3  . pantoprazole (PROTONIX) 40 MG  tablet Take 1 tablet (40 mg total) by mouth daily. 30 tablet 3  . promethazine (PHENERGAN) 12.5 MG tablet Take 1 tablet (12.5 mg total) by mouth every 6 (six) hours as needed for nausea. 30 tablet 0  . sodium bicarbonate 650 MG tablet Take 2 tablets (1,300 mg total) by mouth 2 (two) times daily. 60 tablet 2  . traMADol (ULTRAM) 50 MG tablet Take 1 tablet (50 mg total) by mouth every 12 (twelve) hours as needed. 60 tablet 0   No current facility-administered medications for this visit.    Review of Systems: General: Denies fever, chills, diaphoresis, appetite change and fatigue.  Respiratory: Denies SOB, DOE, cough, and wheezing.   Cardiovascular: Denies chest pain and palpitations.  Gastrointestinal: Denies nausea, vomiting, abdominal pain, and diarrhea.  Genitourinary: Denies dysuria, increased frequency, and flank pain. Endocrine: Denies hot or cold intolerance, polyuria, and polydipsia. Musculoskeletal: Positive for bilateral knee pain. Denies myalgias, back pain, joint swelling, and gait problem.  Skin: Denies pallor, rash and wounds.  Neurological: Denies dizziness, seizures, syncope, weakness, lightheadedness, numbness and headaches.  Psychiatric/Behavioral: Denies mood changes, and sleep disturbances.  Objective:   Physical Exam: Filed Vitals:   05/26/14 1514  BP: 135/88  Pulse: 98  Temp: 98.2 F (36.8 C)  TempSrc: Oral  Height: 5\' 3"  (1.6 m)  Weight: 187 lb 11.2 oz (85.14 kg)  SpO2: 100%    General:  [place] speaking male, alert, cooperative, NAD. HEENT: PERRL, EOMI. Moist mucus membranes Neck: Full range of motion without pain, supple, no lymphadenopathy or carotid bruits Lungs: Clear to ascultation bilaterally, normal work of respiration, no wheezes, rales, rhonchi Heart: RRR, no murmurs, gallops, or rubs Abdomen: Soft, non-tender, non-distended, BS +  Extremities: No cyanosis or clubbing. Only mild tenderness to palpation over knees bilaterally. No significant  increase in warmth or erythema.  Neurologic: Alert & oriented X3, cranial nerves II-XII intact, strength grossly intact, sensation intact to light touch    Assessment & Plan:   Please see problem based assessment and plan.

## 2014-05-27 ENCOUNTER — Encounter: Payer: Self-pay | Admitting: Internal Medicine

## 2014-05-27 DIAGNOSIS — M170 Bilateral primary osteoarthritis of knee: Secondary | ICD-10-CM | POA: Insufficient documentation

## 2014-05-27 NOTE — Assessment & Plan Note (Signed)
Think his knee pain is very likely multifactorial involving gout and OA, however, given his low uric acid, feel that this chronic pain is heavily contributed to by arthritis. Given his CKD, patient is not a candidate for NSAID therapy. He states he has tried tylenol OTC but this does not work. Explained the importance of diet and exercise as weight loss would improve his pain.  -PT referral for strength training and increased mobility -Encouraged tylenol prn first line for pain -Given Rx for Tramadol 50 mg 1-2 times daily #60 for breakthrough pain when severe

## 2014-05-27 NOTE — Assessment & Plan Note (Signed)
BP Readings from Last 3 Encounters:  05/26/14 135/88  04/21/14 121/74  03/26/14 147/96    Lab Results  Component Value Date   NA 144 03/31/2014   K 4.8 03/31/2014   CREATININE 2.31* 03/31/2014    Assessment: Blood pressure control: controlled Progress toward BP goal:  at goal Comments: Compliant w/ Lasix 40 daily + Norvasc 5 mg daily.   Plan: Medications:  continue current medications Educational resources provided:  (denies) Self management tools provided:   Other plans: RTC in 3 months

## 2014-05-27 NOTE — Assessment & Plan Note (Signed)
Recently had appointment w/ Dr. Justin Mend (nephrology) who stated CKD is stable at this time. Did open discussion about permanent access placement for possible HD in the future and meeting w/ vascular for this. This was very confusing to him. Explained the purpose of obtaining access well before ESRD and discussed the procedure at importance for quite some time. He now seems to understand and wishes to meet w/ vascular.  -Follow up w/ Dr. Justin Mend in 3-6 months -He/caregiver will scheduled appointment w/ [facility] to discuss access placement

## 2014-05-27 NOTE — Assessment & Plan Note (Signed)
Well controlled currently, on Uloric 80 mg daily. Most recent uric acid 4.7. Feel that remaining joint pain is likely related to chronic osteoarthritis as well. Please see section on bilateral knee pain.  -Continue Uloric.

## 2014-05-28 NOTE — Progress Notes (Signed)
Case discussed with Dr. Ronnald Ramp at time of visit.  We reviewed the resident's history and exam and pertinent patient test results.  I agree with the assessment, diagnosis, and plan of care documented in the resident's note.  I agree that the chronic arthritic pain is likely secondary to OA and joint destruction from his previously poorly controlled chronic gout.

## 2014-06-01 ENCOUNTER — Other Ambulatory Visit: Payer: Self-pay | Admitting: *Deleted

## 2014-06-01 DIAGNOSIS — N179 Acute kidney failure, unspecified: Secondary | ICD-10-CM

## 2014-06-04 ENCOUNTER — Ambulatory Visit: Payer: Medicare Other | Attending: Internal Medicine | Admitting: Physical Therapy

## 2014-06-04 ENCOUNTER — Telehealth: Payer: Self-pay | Admitting: *Deleted

## 2014-06-04 DIAGNOSIS — M25661 Stiffness of right knee, not elsewhere classified: Secondary | ICD-10-CM | POA: Insufficient documentation

## 2014-06-04 DIAGNOSIS — M25561 Pain in right knee: Secondary | ICD-10-CM

## 2014-06-04 DIAGNOSIS — M25562 Pain in left knee: Secondary | ICD-10-CM | POA: Insufficient documentation

## 2014-06-04 NOTE — Telephone Encounter (Signed)
appts made and printed...td 

## 2014-06-05 NOTE — Therapy (Signed)
[place], [place], [address] Phone: [phone]   Fax:  [phone]  Physical Therapy Evaluation  Patient Details  Name: Luke Dunn MRN: [id number] Date of Birth: 27-Feb-1951 Referring Provider:  Corky Sox, MD  Encounter Date: 06/04/2014      PT End of Session - 06/05/14 1133    Visit Number 1   Number of Visits 16   Date for PT Re-Evaluation 07/30/14   PT Start Time [id number]   PT Stop Time 1500   PT Time Calculation (min) 45 min   Activity Tolerance Patient limited by pain      Past Medical History  Diagnosis Date  . Gout   . CKD (chronic kidney disease) stage 4, GFR 15-29 ml/min     Followed by [facility].  Thought to be due to long-standing HTN  . Essential hypertension   . Gout 05/04/2009    Qualifier: Diagnosis of  By: Dyann Kief MD, Clifton James      Past Surgical History  Procedure Laterality Date  . No past surgeries    . Esophagogastroduodenoscopy N/A 01/16/2014    Procedure: ESOPHAGOGASTRODUODENOSCOPY (EGD);  Surgeon: Beryle Beams, MD;  Location[facility] ENDOSCOPY;  Service: Endoscopy;  Laterality: N/A;  . Colonoscopy N/A 01/16/2014    Procedure: COLONOSCOPY;  Surgeon: Beryle Beams, MD;  Location: [facility];  Service: Endoscopy;  Laterality: N/A;    There were no vitals taken for this visit.  Visit Diagnosis:  Bilateral knee pain  Knee stiffness, right      Subjective Assessment - 06/04/14 1417    Symptoms Patient here with an interpreter ([place]) and a congregational nurse who assists in his care and appointments.  Bilateral knee pain 4 years but worsening in last 1 year.   Right worse than left.   hurts with going up and down steps (upstairs apartment).  Steeper than usual.  Walking on level ground is painful but not as bad as stairs.    Pertinent History Gout; chronic kidney disease   Limitations Walking;House hold activities   How long can you walk comfortably? 5 min   Diagnostic tests X-ray not done   Patient Stated Goals Get healed, not painful anymore   Currently in Pain? Yes   Pain Score 5    Pain Location Knee   Pain Orientation Right;Left   Pain Type Chronic pain   Pain Onset More than a month ago   Aggravating Factors  stairs,walking   Pain Relieving Factors sitting,lying with knees a little bent;           [facility] PT Assessment - 06/04/14 1424    Assessment   Medical Diagnosis --   bilateral knee pain   Onset Date --  4 years but worsening over the last year   Next MD Visit March  Vascular surgeon for port for dialysis   Prior Therapy --  no   Precautions   Precautions None   Precaution Comments --  dizziness upon sitting up/standing   Restrictions   Weight Bearing Restrictions No   Balance Screen   Has the patient fallen in the past 6 months No   Has the patient had a decrease in activity level because of a fear of falling?  No   Is the patient reluctant to leave their home because of a fear of falling?  No   Home Environment   Living Enviornment Private residence   Living Arrangements Alone   Available Help at Discharge --  Congregational nurse,Carolyn   Type of Home Apartment   Home Access Stairs to enter   Entrance Stairs-Number of Steps --  flight   Additional Comments --  Walks 2-3 blocks to grocery store; 5-10 min   Prior Function   Level of Independence Independent with basic ADLs   Observation/Other Assessments   Observations --  bilateral genu varus right >left   Focus on Therapeutic Outcomes (FOTO)  --  not done secondary to language barrier   AROM   Right Knee Extension 13   Right Knee Flexion 127   Left Knee Extension 5   Left Knee Flexion 124   Strength   Right Hip Flexion 4/5   Right Hip ABduction 3+/5   Left Hip Flexion 4/5   Left Hip ABduction 3+/5   Right Knee Flexion 3+/5   Right Knee Extension 3+/5   Left Knee Flexion 3+/5   Left Knee Extension 3+/5   Flexibility   Soft Tissue Assessment  /Muscle Lenght yes   Hamstrings --  Right SLR painful 45 degrees   Ambulation/Gait   Ambulation/Gait Yes  No device today   Gait Pattern Decreased step length - right;Decreased step length - left                          PT Education - 06/05/14 1132    Education provided Yes   Education Details Via interpreter and congregation nurse and patient   Person(s) Educated Patient;Caregiver(s)   Methods Explanation;Demonstration   Comprehension Verbalized understanding;Returned demonstration          PT Short Term Goals - 06/05/14 1153    PT SHORT TERM GOAL #1   Title "Independent with initial HEP   Time 4   Period Weeks   Status New   PT SHORT TERM GOAL #2   Title "Demonstrate and verbalize understanding of condition management including RICE, positioning, use of A.D., HEP.    Time 4   Period Weeks   Status New   PT SHORT TERM GOAL #3   Title Patient will have improved right knee extension to 10 degrees needed for decreased pain with walking community distances.   Time 4   Period Weeks   Status New           PT Long Term Goals - 06/05/14 1155    PT LONG TERM GOAL #1   Title "Pt will be independent with advanced HEP.    Time 8   Period Weeks   Status New   PT LONG TERM GOAL #2   Title The patient will have gait speed of at least .36m/sec needed for community ambulation and decreased incidence of adverse events.    Time 8   Period Weeks   Status New   PT LONG TERM GOAL #3   Title Bilateral knee flexion improved to 130 degrees needed for greater ease ascending and descending apartment steps.   Time 8   Period Weeks   Status New   PT LONG TERM GOAL #4   Title Patient will report overall improvement in pain and function at 40%   Time 8   Period Weeks   Status New               Plan - 06/05/14 1135    Clinical Impression Statement The patient is a 57 year [place] speaking male who, with the assistance of Language Resources and a  Scientist, research (physical sciences), expresses a 4 year history of  bilateral knee pain with a worsening of symptoms over the last year.  Medical history significant for gout as well as OA in his knees.  He lives in an upstairs apartment and uses a RW when the pain is really bad.  The congregational nurse reports that the patient has been crying in pain at times.  He walks to the market or [employer] for food (about 2 blocks away) but stays home on bad days since going up and down steps are especially painful.  He states he is better with medication and indicates a bottle of non-prescriptive medicine written in his language.  On examination, mild peri-patellar swelling and bilateral genu varus right > left.  Decreased peri-patellar mobility and very painful.  All knee AROM is painful and limited:  right 13-127,left 5-124 degrees.  Able to do a SLR but very painful.  Passive right HS length limited by pain to 40 degrees.  MMT painful at hip,knee and ankle.  Knee grossly 3+/5 bilaterally.  Hip flexors 4/5,hip abductors 3+/5 bilaterally.  Pain control will be difficult with Mr. Berninger but will attempt ROM and strengthening within his pain limits.  Recommended a single point cane for pain control when leaving his apartment to assist on the stairs and the 2 blocks to the store/restaurant.       Pt will benefit from skilled therapeutic intervention in order to improve on the following deficits Abnormal gait;Decreased activity tolerance;Pain;Decreased strength;Difficulty walking;Decreased range of motion;Increased edema   Rehab Potential Good   Clinical Impairments Affecting Rehab Potential gout   PT Frequency 2x / week   PT Duration 8 weeks   PT Treatment/Interventions ADLs/Self Care Home Management;Cryotherapy;Electrical Stimulation;Moist Heat;Therapeutic activities;Therapeutic exercise;Patient/family education;Manual techniques   PT Next Visit Plan Instruct in [facility], review seated quad sets, glut sets and bilateral knee ROM  flex/extend;  ?Nu-Step; measure TUG and 72m walk;  modalities (cold?)for pain control          G-Codes - 2014/06/22 1159    Functional Assessment Tool Used Clinical judgement; ROM;MMT   Functional Limitation Mobility: Walking and moving around   Mobility: Walking and Moving Around Current Status [phone]) At least 60 percent but less than 80 percent impaired, limited or restricted   Mobility: Walking and Moving Around Goal Status [phone]) At least 40 percent but less than 60 percent impaired, limited or restricted       Problem List Patient Active Problem List   Diagnosis Date Noted  . Bilateral knee pain 05/27/2014  . Chronic joint pain 05/26/2014  . Gout 04/21/2014  . Barrett's esophagus 02/03/2014  . Eosinophilia 02/03/2014  . Hyperkalemia 01/14/2014  . Acute renal failure superimposed on stage 4 chronic kidney disease 01/14/2014  . Decreased appetite 01/13/2014  . Loss of weight 01/13/2014  . Microcytic anemia 08/30/2012  . Preventative health care 12/12/2010  . Chronic gout of foot 05/04/2009  . Essential hypertension, benign 11/19/2008  . CKD (chronic kidney disease) stage 4, GFR 15-29 ml/min 11/19/2008    Alvera Singh Jun 22, 2014, 12:02 PM  [facility] [address] [place], [place], [address] Phone: [phone]   Fax:  [phone]   Ruben Im, Braham June 22, 2014 12:03 PM Phone: [phone] Fax: [phone]

## 2014-06-05 NOTE — Patient Instructions (Signed)
Seated quad sets, seated gluteal sets, seated or supine knee flexion/extension every 2 hours 10x each

## 2014-06-12 ENCOUNTER — Encounter: Payer: Medicare Other | Admitting: Physical Therapy

## 2014-06-16 ENCOUNTER — Ambulatory Visit: Payer: Medicare Other | Admitting: Physical Therapy

## 2014-06-18 ENCOUNTER — Ambulatory Visit: Payer: Medicare Other | Admitting: Physical Therapy

## 2014-06-23 ENCOUNTER — Encounter: Payer: Medicare Other | Admitting: Physical Therapy

## 2014-06-26 ENCOUNTER — Encounter: Payer: Medicare Other | Admitting: Physical Therapy

## 2014-06-26 ENCOUNTER — Encounter: Payer: Self-pay | Admitting: Surgery

## 2014-06-29 ENCOUNTER — Ambulatory Visit (INDEPENDENT_AMBULATORY_CARE_PROVIDER_SITE_OTHER): Payer: Medicare Other | Admitting: Surgery

## 2014-06-29 ENCOUNTER — Encounter: Payer: Self-pay | Admitting: Surgery

## 2014-06-29 ENCOUNTER — Ambulatory Visit (HOSPITAL_COMMUNITY)
Admission: RE | Admit: 2014-06-29 | Discharge: 2014-06-29 | Disposition: A | Discharge status: Home / Self Care | Payer: Medicare Other | Source: Ambulatory Visit | Attending: Surgery | Admitting: Surgery

## 2014-06-29 ENCOUNTER — Ambulatory Visit (INDEPENDENT_AMBULATORY_CARE_PROVIDER_SITE_OTHER)
Admission: RE | Admit: 2014-06-29 | Discharge: 2014-06-29 | Disposition: A | Discharge status: Home / Self Care | Payer: Medicare Other | Source: Ambulatory Visit | Attending: Surgery | Admitting: Surgery

## 2014-06-29 VITALS — BP 140/94 | HR 90 | Ht 63.0 in | Wt 193.8 lb

## 2014-06-29 DIAGNOSIS — N184 Chronic kidney disease, stage 4 (severe): Secondary | ICD-10-CM

## 2014-06-29 DIAGNOSIS — Z01818 Encounter for other preprocedural examination: Secondary | ICD-10-CM | POA: Insufficient documentation

## 2014-06-29 DIAGNOSIS — N179 Acute kidney failure, unspecified: Secondary | ICD-10-CM

## 2014-06-29 NOTE — Progress Notes (Signed)
Patient name: Luke Dunn MRN: [id number] DOB: 1951-03-04 Sex: male   Referred by: Dr. Justin Mend  Reason for referral:  Chief Complaint  Patient presents with  . New Evaluation    eval for access placement    HISTORY OF PRESENT ILLNESS: This is a 64 year old gentleman who is right-handed.  He is referred today for dialysis access.  He is not yet on dialysis.  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  The patient suffers from hypercholesterolemia.  His most recent LDL was 127 with a total cholesterol of 207.  He is not on a statin.  He also suffers from anemia.  Past Medical History  Diagnosis Date  . Gout   . CKD (chronic kidney disease) stage 4, GFR 15-29 ml/min     Followed by [facility].  Thought to be due to long-standing HTN  . Essential hypertension   . Gout 05/04/2009    Qualifier: Diagnosis of  By: Dyann Kief MD, Clifton James      Past Surgical History  Procedure Laterality Date  . No past surgeries    . Esophagogastroduodenoscopy N/A 01/16/2014    Procedure: ESOPHAGOGASTRODUODENOSCOPY (EGD);  Surgeon: Beryle Beams, MD;  Location: [facility] ENDOSCOPY;  Service: Endoscopy;  Laterality: N/A;  . Colonoscopy N/A 01/16/2014    Procedure: COLONOSCOPY;  Surgeon: Beryle Beams, MD;  Location: [facility];  Service: Endoscopy;  Laterality: N/A;    History   Social History  . Marital Status: Single    Spouse Name: N/A    Number of Children: N/A  . Years of Education: N/A   Occupational History  . Not on file.   Social History Main Topics  . Smoking status: Former Smoker -- 1.00 packs/day for 37 years    Quit date: 02/01/2007  . Smokeless tobacco: Former Systems developer    Quit date: 02/01/2007  . Alcohol Use: No  . Drug Use: No  . Sexual Activity: Not on file   Other Topics Concern  . Not on file   Social History Narrative   [name] speaking only, low educational level, works in Architect.  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         Financial assistance approved for 70% discount at [facility] and has [facility] card.     No family history on file.  Allergies as of 06/29/2014  . (No Known Allergies)    Current Outpatient Prescriptions on File Prior to Visit  Medication Sig Dispense Refill  . amLODipine (NORVASC) 5 MG tablet Take 1 tablet (5 mg total) by mouth daily. 30 tablet 5  . febuxostat (ULORIC) 40 MG tablet Take 2 tablets (80 mg total) by mouth daily. 60 tablet 5  . furosemide (LASIX) 40 MG tablet Take 1 tablet (40 mg total) by mouth daily. 90 tablet 3  . hydrocerin (EUCERIN) CREA Apply 2 application topically 2 (two) times daily. 454 g 0  . Nutritional Supplements (FEEDING SUPPLEMENT, NEPRO CARB STEADY,) LIQD Take 237 mLs by mouth 3 (three) times daily with meals. 30 Can 3  . pantoprazole (PROTONIX) 40 MG tablet Take 1 tablet (40 mg total) by mouth daily. 30 tablet 3  . promethazine (PHENERGAN) 12.5 MG tablet Take 1 tablet (12.5 mg total) by mouth every 6 (six) hours as needed for nausea. 30 tablet 0  . sodium bicarbonate 650 MG tablet Take 2 tablets (1,300 mg total) by mouth 2 (two) times daily. 60 tablet 2  . traMADol (ULTRAM) 50 MG tablet Take  1 tablet (50 mg total) by mouth every 12 (twelve) hours as needed. 60 tablet 0   No current facility-administered medications on file prior to visit.     REVIEW OF SYSTEMS: Cardiovascular: No chest pain, chest pressure, palpitations, orthopnea, or dyspnea on exertion.   No history of DVT or phlebitis.positive for pain in legs on walking as well as leg swelling Pulmonary: No productive cough, asthma or wheezing. Neurologic: No weakness, paresthesias, aphasia, or amaurosis. No dizziness. Hematologic: No bleeding problems or clotting disorders. Musculoskeletal: No joint pain or joint swelling. Gastrointestinal: No blood in stool or hematemesis Genitourinary: No dysuria or hematuria. Psychiatric:: No history of major depression. Integumentary: No rashes or  ulcers. Constitutional: No fever or chills.  PHYSICAL EXAMINATION: General: The patient appears their stated age.  Vital signs are BP 140/94 mmHg  Pulse 90  Ht 5\' 3"  (1.6 m)  Wt 193 lb 12.8 oz (87.907 kg)  BMI 34.34 kg/m2  SpO2 97% HEENT:  No gross abnormalities Pulmonary: Respirations are non-labored Abdomen: Soft and non-tender  Musculoskeletal: There are no major deformities.   Neurologic: No focal weakness or paresthesias are detected, Skin: There are no ulcer or rashes noted. Psychiatric: The patient has normal affect. Cardiovascular: There is a regular rate and rhythm without significant murmur appreciated.palpable left brachial and radial pulse  Diagnostic Studies: I have reviewed his vein mapping which reveals an adequate left cephalic vein as well as left basilic vein.  His arterial evaluation was normal with a normal Allen's test.   Assessment:  Chronic renal insufficiency Plan: Discussion with the patient was held in the presence of an interpreter.  We discussed proceeding with a left upper arm (brachiocephalic) AV fistula.  I discussed the risks and benefits of the operation including the risk of non-maturity and the need for future interventions, as well as the risk of steal syndrome.  All the questions were answered.  This has been scheduled for Friday, February 19     V. Leia Alf, M.D. [facility] Office: [phone] Pager:  [phone]

## 2014-06-30 ENCOUNTER — Other Ambulatory Visit: Payer: Self-pay | Admitting: *Deleted

## 2014-06-30 DIAGNOSIS — N184 Chronic kidney disease, stage 4 (severe): Secondary | ICD-10-CM

## 2014-06-30 MED ORDER — SODIUM BICARBONATE 650 MG PO TABS: 1300.0000 mg | ORAL_TABLET | Freq: Two times a day (BID) | ORAL | Status: DC

## 2014-06-30 NOTE — Telephone Encounter (Signed)
Pt takes 4 tabs a day and refill is only for # 60.  Please increase to # 120 a month and resend

## 2014-07-03 ENCOUNTER — Other Ambulatory Visit: Payer: Self-pay

## 2014-07-07 ENCOUNTER — Other Ambulatory Visit: Payer: Self-pay | Admitting: Internal Medicine

## 2014-07-09 ENCOUNTER — Encounter (HOSPITAL_COMMUNITY): Payer: Self-pay | Admitting: *Deleted

## 2014-07-09 MED ORDER — SODIUM CHLORIDE 0.9 % IV SOLN
INTRAVENOUS | Status: DC
  Administered 2014-07-10: 11:00:00 via INTRAVENOUS

## 2014-07-09 MED ORDER — DEXTROSE 5 % IV SOLN: 1.5000 g | INTRAVENOUS | Status: DC

## 2014-07-09 MED ORDER — CHLORHEXIDINE GLUCONATE CLOTH 2 % EX PADS: 6.0000 | MEDICATED_PAD | Freq: Once | CUTANEOUS | Status: DC

## 2014-07-09 NOTE — Progress Notes (Signed)
Called pt's congregational nurse, Judie Petit' Brien for pre-op call. She verified allergies, meds, medical and surgical history. Pre-op instructions given to her. She states she will go to his house today and make sure he has only the Amlodipine and Protonix in his pill box to take in the AM. She is aware that pt needs to be here at 11:00 AM.

## 2014-07-10 ENCOUNTER — Ambulatory Visit (HOSPITAL_COMMUNITY)
Admission: RE | Admit: 2014-07-10 | Discharge: 2014-07-10 | Disposition: A | Discharge status: Home / Self Care | Payer: Medicare Other | Source: Ambulatory Visit | Attending: Surgery | Admitting: Surgery

## 2014-07-10 ENCOUNTER — Encounter (HOSPITAL_COMMUNITY)
Admission: RE | Disposition: A | Discharge status: Home / Self Care | Payer: Self-pay | Source: Ambulatory Visit | Attending: Surgery

## 2014-07-10 ENCOUNTER — Ambulatory Visit (HOSPITAL_COMMUNITY): Payer: Medicare Other | Admitting: Anesthesiology

## 2014-07-10 ENCOUNTER — Encounter (HOSPITAL_COMMUNITY): Payer: Self-pay | Admitting: Certified Registered Nurse Anesthetist

## 2014-07-10 ENCOUNTER — Other Ambulatory Visit: Payer: Self-pay | Admitting: *Deleted

## 2014-07-10 DIAGNOSIS — Z87891 Personal history of nicotine dependence: Secondary | ICD-10-CM | POA: Diagnosis not present

## 2014-07-10 DIAGNOSIS — N186 End stage renal disease: Secondary | ICD-10-CM | POA: Insufficient documentation

## 2014-07-10 DIAGNOSIS — N185 Chronic kidney disease, stage 5: Secondary | ICD-10-CM | POA: Diagnosis not present

## 2014-07-10 DIAGNOSIS — K219 Gastro-esophageal reflux disease without esophagitis: Secondary | ICD-10-CM | POA: Diagnosis not present

## 2014-07-10 DIAGNOSIS — M25562 Pain in left knee: Secondary | ICD-10-CM

## 2014-07-10 DIAGNOSIS — M109 Gout, unspecified: Secondary | ICD-10-CM | POA: Diagnosis not present

## 2014-07-10 DIAGNOSIS — I120 Hypertensive chronic kidney disease with stage 5 chronic kidney disease or end stage renal disease: Secondary | ICD-10-CM | POA: Insufficient documentation

## 2014-07-10 DIAGNOSIS — E780 Pure hypercholesterolemia: Secondary | ICD-10-CM | POA: Insufficient documentation

## 2014-07-10 DIAGNOSIS — I10 Essential (primary) hypertension: Secondary | ICD-10-CM | POA: Diagnosis not present

## 2014-07-10 DIAGNOSIS — Z4931 Encounter for adequacy testing for hemodialysis: Secondary | ICD-10-CM

## 2014-07-10 DIAGNOSIS — M25561 Pain in right knee: Secondary | ICD-10-CM

## 2014-07-10 HISTORY — DX: Edema, unspecified: R60.9

## 2014-07-10 HISTORY — DX: Gastro-esophageal reflux disease without esophagitis: K21.9

## 2014-07-10 HISTORY — PX: AV FISTULA PLACEMENT: SHX1204

## 2014-07-10 HISTORY — DX: Barrett's esophagus without dysplasia: K22.70

## 2014-07-10 LAB — POCT I-STAT 4, (NA,K, GLUC, HGB,HCT)
Glucose, Bld: 98 mg/dL (ref 70–99)
HEMATOCRIT: 39 % (ref 39.0–52.0)
Hemoglobin: 13.3 g/dL (ref 13.0–17.0)
Potassium: 4.7 mmol/L (ref 3.5–5.1)
Sodium: 142 mmol/L (ref 135–145)

## 2014-07-10 SURGERY — ARTERIOVENOUS (AV) FISTULA CREATION
Anesthesia: General | Site: Arm Upper | Laterality: Left

## 2014-07-10 MED ORDER — 0.9 % SODIUM CHLORIDE (POUR BTL) OPTIME
TOPICAL | Status: DC | PRN
  Administered 2014-07-10: 1000 mL

## 2014-07-10 MED ORDER — FENTANYL CITRATE 0.05 MG/ML IJ SOLN
INTRAMUSCULAR | Status: AC
  Filled 2014-07-10: qty 5

## 2014-07-10 MED ORDER — PROTAMINE SULFATE 10 MG/ML IV SOLN
INTRAVENOUS | Status: AC
  Filled 2014-07-10: qty 5

## 2014-07-10 MED ORDER — PROTAMINE SULFATE 10 MG/ML IV SOLN
INTRAVENOUS | Status: DC | PRN
  Administered 2014-07-10: 25 mg via INTRAVENOUS

## 2014-07-10 MED ORDER — LIDOCAINE HCL (CARDIAC) 20 MG/ML IV SOLN
INTRAVENOUS | Status: DC | PRN
  Administered 2014-07-10: 100 mg via INTRAVENOUS

## 2014-07-10 MED ORDER — ONDANSETRON HCL 4 MG/2ML IJ SOLN
INTRAMUSCULAR | Status: AC
  Filled 2014-07-10: qty 2

## 2014-07-10 MED ORDER — LIDOCAINE-EPINEPHRINE (PF) 1 %-1:200000 IJ SOLN
INTRAMUSCULAR | Status: AC
  Filled 2014-07-10: qty 10

## 2014-07-10 MED ORDER — PROPOFOL 10 MG/ML IV BOLUS
INTRAVENOUS | Status: DC | PRN
  Administered 2014-07-10: 90 mg via INTRAVENOUS
  Administered 2014-07-10: 10 mg via INTRAVENOUS

## 2014-07-10 MED ORDER — CEFAZOLIN SODIUM-DEXTROSE 2-3 GM-% IV SOLR
INTRAVENOUS | Status: DC | PRN
  Administered 2014-07-10: 2 g via INTRAVENOUS

## 2014-07-10 MED ORDER — OXYCODONE HCL 5 MG PO TABS: 5.0000 mg | ORAL_TABLET | Freq: Once | ORAL | Status: DC | PRN

## 2014-07-10 MED ORDER — HEPARIN SODIUM (PORCINE) 1000 UNIT/ML IJ SOLN
INTRAMUSCULAR | Status: AC
  Filled 2014-07-10: qty 1

## 2014-07-10 MED ORDER — SODIUM CHLORIDE 0.9 % IV SOLN: INTRAVENOUS | Status: DC

## 2014-07-10 MED ORDER — FENTANYL CITRATE 0.05 MG/ML IJ SOLN: 25.0000 ug | INTRAMUSCULAR | Status: DC | PRN

## 2014-07-10 MED ORDER — TRAMADOL HCL 50 MG PO TABS: 50.0000 mg | ORAL_TABLET | Freq: Two times a day (BID) | ORAL | Status: AC | PRN

## 2014-07-10 MED ORDER — ONDANSETRON HCL 4 MG/2ML IJ SOLN
4.0000 mg | Freq: Once | INTRAMUSCULAR | Status: AC | PRN
Start: 2014-07-10 — End: 2014-07-10
  Administered 2014-07-10: 4 mg via INTRAVENOUS

## 2014-07-10 MED ORDER — SODIUM CHLORIDE 0.9 % IV SOLN
INTRAVENOUS | Status: DC | PRN
  Administered 2014-07-10: 12:00:00 via INTRAVENOUS

## 2014-07-10 MED ORDER — FENTANYL CITRATE 0.05 MG/ML IJ SOLN
INTRAMUSCULAR | Status: DC | PRN
  Administered 2014-07-10 (×3): 50 ug via INTRAVENOUS

## 2014-07-10 MED ORDER — OXYCODONE HCL 5 MG/5ML PO SOLN: 5.0000 mg | Freq: Once | ORAL | Status: DC | PRN

## 2014-07-10 MED ORDER — MIDAZOLAM HCL 2 MG/2ML IJ SOLN
INTRAMUSCULAR | Status: AC
  Filled 2014-07-10: qty 2

## 2014-07-10 MED ORDER — LIDOCAINE HCL (CARDIAC) 20 MG/ML IV SOLN
INTRAVENOUS | Status: AC
  Filled 2014-07-10: qty 5

## 2014-07-10 MED ORDER — HEPARIN SODIUM (PORCINE) 5000 UNIT/ML IJ SOLN
INTRAMUSCULAR | Status: DC | PRN
  Administered 2014-07-10: 500 mL

## 2014-07-10 MED ORDER — PROPOFOL 10 MG/ML IV BOLUS
INTRAVENOUS | Status: AC
  Filled 2014-07-10: qty 20

## 2014-07-10 MED ORDER — MIDAZOLAM HCL 5 MG/5ML IJ SOLN
INTRAMUSCULAR | Status: DC | PRN
  Administered 2014-07-10 (×2): 1 mg via INTRAVENOUS

## 2014-07-10 MED ORDER — ONDANSETRON HCL 4 MG/2ML IJ SOLN
INTRAMUSCULAR | Status: DC | PRN
  Administered 2014-07-10: 4 mg via INTRAVENOUS

## 2014-07-10 SURGICAL SUPPLY — 37 items
ARMBAND PINK RESTRICT EXTREMIT (MISCELLANEOUS) ×3 IMPLANT
BLADE SURG 10 STRL SS (BLADE) ×3 IMPLANT
CANISTER SUCTION 2500CC (MISCELLANEOUS) ×3 IMPLANT
CLIP TI MEDIUM 6 (CLIP) ×3 IMPLANT
CLIP TI WIDE RED SMALL 6 (CLIP) ×3 IMPLANT
COVER PROBE W GEL 5X96 (DRAPES) ×3 IMPLANT
COVER SURGICAL LIGHT HANDLE (MISCELLANEOUS) ×3 IMPLANT
ELECT REM PT RETURN 9FT ADLT (ELECTROSURGICAL) ×3
ELECTRODE REM PT RTRN 9FT ADLT (ELECTROSURGICAL) ×1 IMPLANT
GLOVE BIOGEL M 6.5 STRL (GLOVE) ×2 IMPLANT
GLOVE BIOGEL PI IND STRL 6 (GLOVE) IMPLANT
GLOVE BIOGEL PI IND STRL 6.5 (GLOVE) IMPLANT
GLOVE BIOGEL PI IND STRL 7.0 (GLOVE) IMPLANT
GLOVE BIOGEL PI IND STRL 7.5 (GLOVE) ×1 IMPLANT
GLOVE BIOGEL PI INDICATOR 6 (GLOVE) ×2
GLOVE BIOGEL PI INDICATOR 6.5 (GLOVE) ×2
GLOVE BIOGEL PI INDICATOR 7.0 (GLOVE) ×2
GLOVE BIOGEL PI INDICATOR 7.5 (GLOVE) ×2
GLOVE SURG SS PI 7.5 STRL IVOR (GLOVE) ×3 IMPLANT
GOWN STRL REUS W/ TWL LRG LVL3 (GOWN DISPOSABLE) ×2 IMPLANT
GOWN STRL REUS W/ TWL XL LVL3 (GOWN DISPOSABLE) ×1 IMPLANT
GOWN STRL REUS W/TWL LRG LVL3 (GOWN DISPOSABLE) ×6
GOWN STRL REUS W/TWL XL LVL3 (GOWN DISPOSABLE) ×3
HEMOSTAT SNOW SURGICEL 2X4 (HEMOSTASIS) IMPLANT
KIT BASIN OR (CUSTOM PROCEDURE TRAY) ×3 IMPLANT
KIT ROOM TURNOVER OR (KITS) ×3 IMPLANT
LIQUID BAND (GAUZE/BANDAGES/DRESSINGS) ×3 IMPLANT
NS IRRIG 1000ML POUR BTL (IV SOLUTION) ×3 IMPLANT
PACK CV ACCESS (CUSTOM PROCEDURE TRAY) ×3 IMPLANT
PAD ARMBOARD 7.5X6 YLW CONV (MISCELLANEOUS) ×6 IMPLANT
PROBE PENCIL 8 MHZ STRL DISP (MISCELLANEOUS) ×2 IMPLANT
SUT PROLENE 6 0 CC (SUTURE) ×5 IMPLANT
SUT VIC AB 3-0 SH 27 (SUTURE) ×3
SUT VIC AB 3-0 SH 27X BRD (SUTURE) ×1 IMPLANT
SUT VICRYL 4-0 PS2 18IN ABS (SUTURE) ×2 IMPLANT
UNDERPAD 30X30 INCONTINENT (UNDERPADS AND DIAPERS) ×3 IMPLANT
WATER STERILE IRR 1000ML POUR (IV SOLUTION) ×3 IMPLANT

## 2014-07-10 NOTE — Transfer of Care (Signed)
Immediate Anesthesia Transfer of Care Note  Patient: Luke Dunn  Procedure(s) Performed: Procedure(s): ARTERIOVENOUS (AV) FISTULA CREATION-left (Left)  Patient Location: PACU  Anesthesia Type:General  Level of Consciousness: awake, alert , oriented and patient cooperative  Airway & Oxygen Therapy: Patient Spontanous Breathing and Patient connected to face mask oxygen  Post-op Assessment: Report given to RN, Post -op Vital signs reviewed and stable and Patient moving all extremities  Post vital signs: Reviewed and stable  Last Vitals:  Filed Vitals:   07/10/14 1048  BP: 152/103  Pulse: 79  Temp: 36.4 C  Resp: 18    Complications: No apparent anesthesia complications

## 2014-07-10 NOTE — Anesthesia Procedure Notes (Signed)
Procedure Name: LMA Insertion Date/Time: 07/10/2014 12:15 PM Performed by: Rogers Blocker Pre-anesthesia Checklist: Patient identified, Emergency Drugs available, Suction available, Patient being monitored and Timeout performed Patient Re-evaluated:Patient Re-evaluated prior to inductionOxygen Delivery Method: Circle system utilized Preoxygenation: Pre-oxygenation with 100% oxygen Intubation Type: IV induction LMA: LMA inserted LMA Size: 4.0 Tube type: Oral Number of attempts: 1 Placement Confirmation: positive ETCO2 and breath sounds checked- equal and bilateral Tube secured with: Tape Dental Injury: Teeth and Oropharynx as per pre-operative assessment

## 2014-07-10 NOTE — Interval H&P Note (Signed)
History and Physical Interval Note:  07/10/2014 11:09 AM  Luke Dunn  has presented today for surgery, with the diagnosis of End Stage Renal Disease N18.6  The various methods of treatment have been discussed with the patient and family. After consideration of risks, benefits and other options for treatment, the patient has consented to  Procedure(s): ARTERIOVENOUS (AV) FISTULA CREATION-left (Left) as a surgical intervention .  The patient's history has been reviewed, patient examined, no change in status, stable for surgery.  I have reviewed the patient's chart and labs.  Questions were answered to the patient's satisfaction.     [name], V. WELLS

## 2014-07-10 NOTE — Progress Notes (Signed)
Donnie Interpreter called. Surgery moved up,he will be here asap

## 2014-07-10 NOTE — Op Note (Signed)
    Patient name: Luke Dunn MRN: [id number] DOB: 1950-11-02 Sex: male  07/10/2014 Pre-operative Diagnosis: ESRD  Post-operative diagnosis:  Same Surgeon:  Eldridge Abrahams Assistants:  Silva Bandy Procedure:   Left brachiocephalic fistula  Anesthesia:  Gen. Blood Loss:  See anesthesia record Specimens:  None  Findings:  Excellent vein, 3 mm.  Artery was 3 mm, disease-free  Indications:  The patient is here for dialysis access.  He is left-handed.  Vein mapping showed an adequate left brachiocephalic vein  Procedure:  The patient was identified in the holding area and taken to [place] [facility]  The patient was then placed supine on the table. general anesthesia was administered.  The patient was prepped and draped in the usual sterile fashion.  A time out was called and antibiotics were administered.  Ultrasound was used to map the course of the cephalic vein.  It was adequate beginning at the antecubital crease.  A transverse incision was made antecubital crease.  I dissected out the cephalic vein.  This was a healthy 3-3 0.5 mm vein.  It was mobilized throughout the incision.  I then exposed the brachial artery.  This is a 3 mm disease-free artery.  It was encircled proximally and distally.  3000 years heparin were given.  The vein was then ligated distally after marking it for orientation.  It flushed easily with heparinized saline.  Next, the artery was occluded.  A #11 blade was used to make an arteriotomy which was extended longitudinally with Potts scissors.  The vein was then cut to the appropriate length and spatulated to fit the size of the arteriotomy.  A running anastomosis was created was 6-0 Prolene.  Prior to completion, all flushing maneuvers were performed.  The anastomosis was completed.  The patient had biphasic Doppler signals in the radial and ulnar artery that did not change significantly with fistula compression.  There was an excellent thrill within the fistula.  25 mg of protamine  was administered.  Once hemostasis was satisfactory, the incision was closed with 2 layers of Vicryl followed by Dermabond.  There were no immediate complications.   Disposition:  To PACU in stable condition.   Theotis Burrow, M.D. [facility] Office: [phone] Pager:  [phone]

## 2014-07-10 NOTE — Anesthesia Postprocedure Evaluation (Signed)
  Anesthesia Post-op Note  Patient: Luke Dunn  Procedure(s) Performed: Procedure(s): ARTERIOVENOUS (AV) FISTULA CREATION-left (Left)  Patient Location: PACU  Anesthesia Type:General  Level of Consciousness: awake, alert  and oriented  Airway and Oxygen Therapy: Patient Spontanous Breathing and Patient connected to nasal cannula oxygen  Post-op Pain: mild  Post-op Assessment: Post-op Vital signs reviewed, Patient's Cardiovascular Status Stable, Respiratory Function Stable, Patent Airway, No signs of Nausea or vomiting and Pain level controlled  Post-op Vital Signs: stable  Last Vitals:  Filed Vitals:   07/10/14 1445  BP: 144/84  Pulse: 88  Temp: 36.5 C  Resp: 10    Complications: No apparent anesthesia complications

## 2014-07-10 NOTE — H&P (View-Only) (Signed)
Patient name: Luke Dunn MRN: [id number] DOB: 10/09/1950 Sex: male   Referred by: Dr. Justin Mend  Reason for referral:  Chief Complaint  Patient presents with  . New Evaluation    eval for access placement    HISTORY OF PRESENT ILLNESS: This is a 64 year old gentleman who is right-handed.  He is referred today for dialysis access.  He is not yet on dialysis.  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  The patient suffers from hypercholesterolemia.  His most recent LDL was 127 with a total cholesterol of 207.  He is not on a statin.  He also suffers from anemia.  Past Medical History  Diagnosis Date  . Gout   . CKD (chronic kidney disease) stage 4, GFR 15-29 ml/min     Followed by [facility].  Thought to be due to long-standing HTN  . Essential hypertension   . Gout 05/04/2009    Qualifier: Diagnosis of  By: Dyann Kief MD, Clifton James      Past Surgical History  Procedure Laterality Date  . No past surgeries    . Esophagogastroduodenoscopy N/A 01/16/2014    Procedure: ESOPHAGOGASTRODUODENOSCOPY (EGD);  Surgeon: Beryle Beams, MD;  [facility] ENDOSCOPY;  Service: Endoscopy;  Laterality: N/A;  . Colonoscopy N/A 01/16/2014    Procedure: COLONOSCOPY;  Surgeon: Beryle Beams, MD;  Location: [place];  Service: Endoscopy;  Laterality: N/A;    History   Social History  . Marital Status: Single    Spouse Name: N/A    Number of Children: N/A  . Years of Education: N/A   Occupational History  . Not on file.   Social History Main Topics  . Smoking status: Former Smoker -- 1.00 packs/day for 37 years    Quit date: 02/01/2007  . Smokeless tobacco: Former Systems developer    Quit date: 02/01/2007  . Alcohol Use: No  . Drug Use: No  . Sexual Activity: Not on file   Other Topics Concern  . Not on file   Social History Narrative   [name] speaking only, low educational level, works in Architect.  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         Financial assistance approved for 70% discount at [facility] and has [facility] card.     No family history on file.  Allergies as of 06/29/2014  . (No Known Allergies)    Current Outpatient Prescriptions on File Prior to Visit  Medication Sig Dispense Refill  . amLODipine (NORVASC) 5 MG tablet Take 1 tablet (5 mg total) by mouth daily. 30 tablet 5  . febuxostat (ULORIC) 40 MG tablet Take 2 tablets (80 mg total) by mouth daily. 60 tablet 5  . furosemide (LASIX) 40 MG tablet Take 1 tablet (40 mg total) by mouth daily. 90 tablet 3  . hydrocerin (EUCERIN) CREA Apply 2 application topically 2 (two) times daily. 454 g 0  . Nutritional Supplements (FEEDING SUPPLEMENT, NEPRO CARB STEADY,) LIQD Take 237 mLs by mouth 3 (three) times daily with meals. 30 Can 3  . pantoprazole (PROTONIX) 40 MG tablet Take 1 tablet (40 mg total) by mouth daily. 30 tablet 3  . promethazine (PHENERGAN) 12.5 MG tablet Take 1 tablet (12.5 mg total) by mouth every 6 (six) hours as needed for nausea. 30 tablet 0  . sodium bicarbonate 650 MG tablet Take 2 tablets (1,300 mg total) by mouth 2 (two) times daily. 60 tablet 2  . traMADol (ULTRAM) 50 MG tablet Take  1 tablet (50 mg total) by mouth every 12 (twelve) hours as needed. 60 tablet 0   No current facility-administered medications on file prior to visit.     REVIEW OF SYSTEMS: Cardiovascular: No chest pain, chest pressure, palpitations, orthopnea, or dyspnea on exertion.   No history of DVT or phlebitis.positive for pain in legs on walking as well as leg swelling Pulmonary: No productive cough, asthma or wheezing. Neurologic: No weakness, paresthesias, aphasia, or amaurosis. No dizziness. Hematologic: No bleeding problems or clotting disorders. Musculoskeletal: No joint pain or joint swelling. Gastrointestinal: No blood in stool or hematemesis Genitourinary: No dysuria or hematuria. Psychiatric:: No history of major depression. Integumentary: No rashes or  ulcers. Constitutional: No fever or chills.  PHYSICAL EXAMINATION: General: The patient appears their stated age.  Vital signs are BP 140/94 mmHg  Pulse 90  Ht 5\' 3"  (1.6 m)  Wt 193 lb 12.8 oz (87.907 kg)  BMI 34.34 kg/m2  SpO2 97% HEENT:  No gross abnormalities Pulmonary: Respirations are non-labored Abdomen: Soft and non-tender  Musculoskeletal: There are no major deformities.   Neurologic: No focal weakness or paresthesias are detected, Skin: There are no ulcer or rashes noted. Psychiatric: The patient has normal affect. Cardiovascular: There is a regular rate and rhythm without significant murmur appreciated.palpable left brachial and radial pulse  Diagnostic Studies: I have reviewed his vein mapping which reveals an adequate left cephalic vein as well as left basilic vein.  His arterial evaluation was normal with a normal Allen's test.   Assessment:  Chronic renal insufficiency Plan: Discussion with the patient was held in the presence of an interpreter.  We discussed proceeding with a left upper arm (brachiocephalic) AV fistula.  I discussed the risks and benefits of the operation including the risk of non-maturity and the need for future interventions, as well as the risk of steal syndrome.  All the questions were answered.  This has been scheduled for Friday, February 19     V. Leia Alf, M.D. [facility] Office: [phone] Pager:  [phone]

## 2014-07-10 NOTE — Anesthesia Preprocedure Evaluation (Signed)
Anesthesia Evaluation  Patient identified by MRN, date of birth, ID band Patient awake    Reviewed: Allergy & Precautions, NPO status , Patient's Chart, lab work & pertinent test results  Airway Mallampati: II  TM Distance: >3 FB Neck ROM: Full    Dental  (+) Teeth Intact, Dental Advisory Given   Pulmonary former smoker,  breath sounds clear to auscultation        Cardiovascular hypertension, Rhythm:Regular Rate:Normal     Neuro/Psych    GI/Hepatic   Endo/Other    Renal/GU      Musculoskeletal   Abdominal   Peds  Hematology   Anesthesia Other Findings   Reproductive/Obstetrics                             Anesthesia Physical Anesthesia Plan  ASA: III  Anesthesia Plan: General   Post-op Pain Management:    Induction: Intravenous  Airway Management Planned: LMA  Additional Equipment:   Intra-op Plan:   Post-operative Plan: Extubation in OR  Informed Consent: I have reviewed the patients History and Physical, chart, labs and discussed the procedure including the risks, benefits and alternatives for the proposed anesthesia with the patient or authorized representative who has indicated his/her understanding and acceptance.   Dental advisory given  Plan Discussed with: CRNA and Anesthesiologist  Anesthesia Plan Comments: (Renal insufficiency not yet on dialysis K- 4.7 Hypertension GERD  Plan GA with LMA)        Anesthesia Quick Evaluation

## 2014-07-11 ENCOUNTER — Encounter (HOSPITAL_COMMUNITY): Payer: Self-pay | Admitting: Surgery

## 2014-07-13 ENCOUNTER — Telehealth: Payer: Self-pay | Admitting: Surgery

## 2014-07-13 NOTE — Telephone Encounter (Signed)
No answer, will mail letter, dpm

## 2014-07-13 NOTE — Telephone Encounter (Signed)
-----   Message from Mena Goes, RN sent at 07/10/2014  2:36 PM EST ----- Regarding: Schedule   ----- Message -----    From: Alvia Grove, PA-C    Sent: 07/10/2014   1:04 PM      To: [facility] Charge Pool  S/p left brachiocephalic AVF [id number]  F/u with Dr. Trula Slade in 6 weeks with duplex.  Thanks Maudie Mercury

## 2014-08-03 ENCOUNTER — Other Ambulatory Visit: Payer: Self-pay | Admitting: Internal Medicine

## 2014-08-13 ENCOUNTER — Encounter: Payer: Self-pay | Admitting: Surgery

## 2014-08-13 ENCOUNTER — Ambulatory Visit (HOSPITAL_COMMUNITY)
Admission: RE | Admit: 2014-08-13 | Discharge: 2014-08-13 | Disposition: A | Discharge status: Home / Self Care | Payer: Medicare Other | Source: Ambulatory Visit | Attending: Surgery | Admitting: Surgery

## 2014-08-13 DIAGNOSIS — Z4931 Encounter for adequacy testing for hemodialysis: Secondary | ICD-10-CM

## 2014-08-13 DIAGNOSIS — N186 End stage renal disease: Secondary | ICD-10-CM

## 2014-08-17 ENCOUNTER — Encounter: Payer: Self-pay | Admitting: Surgery

## 2014-08-17 ENCOUNTER — Ambulatory Visit (INDEPENDENT_AMBULATORY_CARE_PROVIDER_SITE_OTHER): Payer: Self-pay | Admitting: Surgery

## 2014-08-17 VITALS — BP 145/87 | HR 85 | Ht 63.0 in | Wt 195.0 lb

## 2014-08-17 DIAGNOSIS — N184 Chronic kidney disease, stage 4 (severe): Secondary | ICD-10-CM

## 2014-08-17 NOTE — Progress Notes (Signed)
Patient name: Luke Dunn MRN: [id number] DOB: March 30, 1951 Sex: male     Chief Complaint  Patient presents with  . Routine Post Op    6 wk f/u s/p l BC AVF    HISTORY OF PRESENT ILLNESS: This is the patient's first postoperative follow-up.  On 07/10/2014, he underwent a left brachiocephalic fistula.  He is not yet on dialysis.  He denies symptoms of left hand pain.  He has no swelling in his left hand or arm.  Past Medical History  Diagnosis Date  . Gout   . CKD (chronic kidney disease) stage 4, GFR 15-29 ml/min     Followed by [facility].  Thought to be due to long-standing HTN  . Essential hypertension   . Gout 05/04/2009    Qualifier: Diagnosis of  By: Dyann Kief MD, Clifton James    . Barrett's esophagus   . Edema   . GERD (gastroesophageal reflux disease)     Past Surgical History  Procedure Laterality Date  . No past surgeries    . Esophagogastroduodenoscopy N/A 01/16/2014    Procedure: ESOPHAGOGASTRODUODENOSCOPY (EGD);  Surgeon: Beryle Beams, MD;  Location: [facility] ENDOSCOPY;  Service: Endoscopy;  Laterality: N/A;  . Colonoscopy N/A 01/16/2014    Procedure: COLONOSCOPY;  Surgeon: Beryle Beams, MD;  Location: [place];  Service: Endoscopy;  Laterality: N/A;  . Av fistula placement Left 07/10/2014    Procedure: ARTERIOVENOUS (AV) FISTULA CREATION-left;  Surgeon: Serafina Mitchell, MD;  Location: [place];  Service: Vascular;  Laterality: Left;    History   Social History  . Marital Status: Single    Spouse Name: N/A  . Number of Children: N/A  . Years of Education: N/A   Occupational History  . Not on file.   Social History Main Topics  . Smoking status: Former Smoker -- 1.00 packs/day for 37 years    Quit date: 02/01/2007  . Smokeless tobacco: Former Systems developer    Quit date: 02/01/2007  . Alcohol Use: No  . Drug Use: No  . Sexual Activity: Not on file   Other Topics Concern  . 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       Financial assistance approved for 70% discount at [facility] and has [facility] card.     History reviewed. No pertinent family history.  Allergies as of 08/17/2014  . (No Known Allergies)    Current Outpatient Prescriptions on File Prior to Visit  Medication Sig Dispense Refill  . amLODipine (NORVASC) 5 MG tablet Take 1 tablet by mouth daily. 30 tablet 5  . furosemide (LASIX) 40 MG tablet Take 1 tablet (40 mg total) by mouth daily. 90 tablet 3  . hydrocerin (EUCERIN) CREA Apply 2 application topically 2 (two) times daily. 454 g 0  . Nutritional Supplements (FEEDING SUPPLEMENT, NEPRO CARB STEADY,) LIQD Take 237 mLs by mouth 3 (three) times daily with meals. 30 Can 3  . pantoprazole (PROTONIX) 40 MG tablet TAKE ONE TABLET BY MOUTH EVERY DAY 30 tablet 5  . promethazine (PHENERGAN) 12.5 MG tablet Take 1 tablet (12.5 mg total) by mouth every 6 (six) hours as needed for nausea. 30 tablet 0  . sodium bicarbonate 650 MG tablet Take 2 tablets (1,300 mg total) by mouth 2 (two) times daily. 120 tablet 2  . traMADol (ULTRAM) 50 MG tablet Take 1 tablet (50 mg total) by mouth every  12 (twelve) hours as needed. 20 tablet 0  . ULORIC 40 MG tablet Take 2 tablets by mouth daily. 60 tablet 5   No current facility-administered medications on file prior to visit.      PHYSICAL EXAMINATION:   Vital signs are  Filed Vitals:   08/17/14 1154 08/17/14 1157  BP: 146/92 145/87  Pulse: 85   Height: 5\' 3"  (1.6 m)   Weight: 195 lb (88.451 kg)   SpO2: 99%    Body mass index is 34.55 kg/(m^2). General: The patient appears their stated age. HEENT:  No gross abnormalities Pulmonary:  Non labored breathing Skin: There are no ulcer or rashes noted. Psychiatric: The patient has normal affect. Cardiovascular: Excellent thrill within left arm fistula   Diagnostic Studies I have reviewed his ultrasound which shows an excellent diameter vein ranging from 0.63-0.93  cm.  Depth measurements and the arm are around 0.6 cm  Assessment: Status post left brachiocephalic fistula Plan: There is an excellent thrill within the fistula, however ultrasound suggests that it may be slightly on the deep side measuring about 0.6 cm.  Based on the clinical examination, I think this is usable and does not need to be elevated, however if he has trouble with cannulation, elevation should be considered.  He also has a moderate sized branch in the mid arm which could be ligated, however this time I do not think this is necessary.  The patient will follow up on an as-needed basis.  Eldridge Abrahams, M.D. [facility] [place] Office: [phone] Pager:  [phone]

## 2014-08-25 ENCOUNTER — Ambulatory Visit (INDEPENDENT_AMBULATORY_CARE_PROVIDER_SITE_OTHER): Payer: Medicare Other | Admitting: Internal Medicine

## 2014-08-25 ENCOUNTER — Encounter: Payer: Self-pay | Admitting: Internal Medicine

## 2014-08-25 VITALS — BP 149/83 | HR 100 | Temp 97.5°F | Ht 63.0 in | Wt 199.2 lb

## 2014-08-25 DIAGNOSIS — N184 Chronic kidney disease, stage 4 (severe): Secondary | ICD-10-CM

## 2014-08-25 DIAGNOSIS — I10 Essential (primary) hypertension: Secondary | ICD-10-CM

## 2014-08-25 DIAGNOSIS — M25562 Pain in left knee: Secondary | ICD-10-CM | POA: Diagnosis not present

## 2014-08-25 DIAGNOSIS — M25561 Pain in right knee: Secondary | ICD-10-CM

## 2014-08-25 MED ORDER — AMLODIPINE BESYLATE 10 MG PO TABS: 10.0000 mg | ORAL_TABLET | Freq: Every day | ORAL | Status: DC

## 2014-08-25 NOTE — Patient Instructions (Signed)
General Instructions:  1. Please schedule a follow up appointment for 4 months.   2. Please take all medications as previously prescribed with the following changes:  INCREASE Norvasc to 10 mg daily (previously taking 5 mg) for BETTER BP CONTROL.   3. If you have worsening of your symptoms or new symptoms arise, please call the clinic [phone]), or go to the ER immediately if symptoms are severe.  You have done a great job in taking all your medications. Please continue to do this.   Thank you for bringing your medicines today. This helps [place] keep you safe from mistakes.   Progress Toward Treatment Goals:  Treatment Goal 08/25/2014  Blood pressure unchanged    Self Care Goals & Plans:  Self Care Goal 08/25/2014  Manage my medications take my medicines as prescribed; bring my medications to every visit; refill my medications on time  Monitor my health -  Eat healthy foods drink diet soda or water instead of juice or soda; eat more vegetables; eat foods that are low in salt; eat baked foods instead of fried foods; eat fruit for snacks and desserts  Be physically active -  Meeting treatment goals maintain the current self-care plan    No flowsheet data found.   Care Management & Community Referrals:  Referral 08/25/2014  Referrals made for care management support -  Referrals made to community resources none

## 2014-08-25 NOTE — Progress Notes (Signed)
Subjective:   Patient ID: Luke Dunn male   DOB: 1951/01/30 64 y.o.   MRN: [id number]  HPI: [name] is a 64 y.o. [place] speaking male w/ PMHx of HTN, Gout, CKD stage 4, and anemia, presents today for follow up appointment regarding his Gout and chronic knee pain. Since his last clinic visit, patient has received a LUE AVF  On 07/10/14 given his worsening CKD. Today, he has very few complaints. He does state that he has been depressed recently as his brother was killed. He seems to have good support regarding this new and terrible loss. As far as his new access is concerned, he has Luke Dunn issues. He recently was seen by Dr. Trula Slade whose note specifies that his AVF may be slightly deep but otherwise is functioning quite well. The patient reports Luke Dunn discomfort.  With regards to his knees, he states they have been much improved. He recently visited a [place] massage therapist who helped with his pain greatly according to the patient. He continues to take his Uloric for gout and reports Luke Dunn recent gout flares.  Luke Dunn other issues per patient report.   Past Medical History  Diagnosis Date  . Gout   . CKD (chronic kidney disease) stage 4, GFR 15-29 ml/min     Followed by [facility].  Thought to be due to long-standing HTN  . Essential hypertension   . Gout 05/04/2009    Qualifier: Diagnosis of  By: Dyann Kief MD, Clifton James    . Barrett's esophagus   . Edema   . GERD (gastroesophageal reflux disease)    Current Outpatient Prescriptions  Medication Sig Dispense Refill  . amLODipine (NORVASC) 5 MG tablet Take 1 tablet by mouth daily. 30 tablet 5  . furosemide (LASIX) 40 MG tablet Take 1 tablet (40 mg total) by mouth daily. 90 tablet 3  . hydrocerin (EUCERIN) CREA Apply 2 application topically 2 (two) times daily. 454 g 0  . Nutritional Supplements (FEEDING SUPPLEMENT, NEPRO CARB STEADY,) LIQD Take 237 mLs by mouth 3 (three) times daily with meals. 30 Can 3  . pantoprazole (PROTONIX) 40 MG tablet  TAKE ONE TABLET BY MOUTH EVERY DAY 30 tablet 5  . promethazine (PHENERGAN) 12.5 MG tablet Take 1 tablet (12.5 mg total) by mouth every 6 (six) hours as needed for nausea. 30 tablet 0  . sodium bicarbonate 650 MG tablet Take 2 tablets (1,300 mg total) by mouth 2 (two) times daily. 120 tablet 2  . traMADol (ULTRAM) 50 MG tablet Take 1 tablet (50 mg total) by mouth every 12 (twelve) hours as needed. 20 tablet 0  . ULORIC 40 MG tablet Take 2 tablets by mouth daily. 60 tablet 5   Luke Dunn current facility-administered medications for this visit.    Review of Systems General: Denies fever, diaphoresis, appetite change, and fatigue.  Respiratory: Denies SOB, cough, and wheezing.   Cardiovascular: Denies chest pain and palpitations.  Gastrointestinal: Denies nausea, vomiting, abdominal pain, and diarrhea Musculoskeletal: Positive for mild intermittent bilateral knee pain. Denies myalgias, back pain, and gait problem.  Neurological: Denies dizziness, syncope, weakness, lightheadedness, and headaches.  Psychiatric/Behavioral: Denies mood changes, sleep disturbance, and agitation.   Objective:   Physical Exam: Filed Vitals:   08/25/14 1355  BP: 149/83  Pulse: 100  Temp: 97.5 F (36.4 C)  TempSrc: Oral  Height: 5\' 3"  (1.6 m)  Weight: 199 lb 3.2 oz (90.357 kg)  SpO2: 100%   General: Alert, cooperative, NAD. HEENT: PERRL, EOMI. Moist mucus membranes Neck:  Full range of motion without pain, supple, Luke Dunn lymphadenopathy or carotid bruits Lungs: Clear to ascultation bilaterally, normal work of respiration, Luke Dunn wheezes, rales, rhonchi Heart: RRR, Luke Dunn murmurs, gallops, or rubs Abdomen: Soft, non-tender, non-distended, BS + Extremities: Luke Dunn cyanosis, clubbing, or edema. LUE AVF well healed incision, good thrill/bruit.  Neurologic: Alert & oriented X3, cranial nerves II-XII intact, strength grossly intact, sensation intact to light touch   Assessment & Plan:   Please see problem based assessment and  plan.

## 2014-08-26 NOTE — Assessment & Plan Note (Signed)
Recent AVF placement given worsening CKD. Doing well, reports normal urine output. Scheduled to see Dr. Justin Mend in 11/2014.   Most recent BMP as follows:  BMP Latest Ref Rng 07/10/2014 03/31/2014 03/26/2014  Glucose 70 - 99 mg/dL 98 151(H) 133(H)  BUN 6 - 23 mg/dL - 35(H) 57(H)  Creatinine 0.50 - 1.35 mg/dL - 2.31(H) 2.90(H)  Sodium 135 - 145 mmol/L 142 144 139  Potassium 3.5 - 5.1 mmol/L 4.7 4.8 5.5(H)  Chloride 96 - 112 mEq/L - 103 106  CO2 19 - 32 mEq/L - 25 23  Calcium 8.4 - 10.5 mg/dL - 9.0 9.2  -Repeat BMP at next clinic visit.

## 2014-08-26 NOTE — Assessment & Plan Note (Signed)
BP Readings from Last 3 Encounters:  08/25/14 149/83  08/17/14 145/87  07/10/14 144/84    Lab Results  Component Value Date   NA 142 07/10/2014   K 4.7 07/10/2014   CREATININE 2.31* 03/31/2014    Assessment: Blood pressure control: mildly elevated Progress toward BP goal:  unchanged Comments: Has been taking Norvasc 5 mg daily + Lasix 40 mg daily.   Plan: Medications:  Increase Norvasc to 10 mg daily.  Educational resources provided: brochure (has information) Self management tools provided: home blood pressure logbook Other plans: Repeat BMP at next clinic visit.

## 2014-08-26 NOTE — Assessment & Plan Note (Signed)
Most likely multifactorial involving gout as well as underlying osteoarthritis. Both seem to be well controlled at this time. Has been seeing a massage therapist who has apparently been helping him with his knee pain. Has increased his level of activity (walking) according to his caregiver.  -Continue Uloric

## 2014-08-27 NOTE — Progress Notes (Signed)
Internal Medicine Clinic Attending  Case discussed with Dr. Jones soon after the resident saw the patient.  We reviewed the resident's history and exam and pertinent patient test results.  I agree with the assessment, diagnosis, and plan of care documented in the resident's note. 

## 2014-10-09 ENCOUNTER — Other Ambulatory Visit: Payer: Self-pay | Admitting: *Deleted

## 2014-10-09 DIAGNOSIS — I10 Essential (primary) hypertension: Secondary | ICD-10-CM

## 2014-10-12 MED ORDER — FUROSEMIDE 40 MG PO TABS: 40.0000 mg | ORAL_TABLET | Freq: Every day | ORAL | Status: DC

## 2014-10-21 ENCOUNTER — Other Ambulatory Visit: Payer: Self-pay | Admitting: Internal Medicine

## 2014-11-19 DIAGNOSIS — N183 Chronic kidney disease, stage 3 (moderate): Secondary | ICD-10-CM | POA: Diagnosis not present

## 2014-11-19 DIAGNOSIS — N2581 Secondary hyperparathyroidism of renal origin: Secondary | ICD-10-CM | POA: Diagnosis not present

## 2014-11-19 DIAGNOSIS — D631 Anemia in chronic kidney disease: Secondary | ICD-10-CM | POA: Diagnosis not present

## 2014-11-19 DIAGNOSIS — R739 Hyperglycemia, unspecified: Secondary | ICD-10-CM | POA: Diagnosis not present

## 2014-11-19 DIAGNOSIS — N189 Chronic kidney disease, unspecified: Secondary | ICD-10-CM | POA: Diagnosis not present

## 2014-11-19 DIAGNOSIS — E785 Hyperlipidemia, unspecified: Secondary | ICD-10-CM | POA: Diagnosis not present

## 2014-11-30 ENCOUNTER — Ambulatory Visit (INDEPENDENT_AMBULATORY_CARE_PROVIDER_SITE_OTHER): Payer: Medicare Other | Admitting: Internal Medicine

## 2014-11-30 ENCOUNTER — Encounter: Payer: Self-pay | Admitting: Internal Medicine

## 2014-11-30 VITALS — BP 143/79 | HR 97 | Temp 97.9°F | Ht 63.0 in | Wt 203.0 lb

## 2014-11-30 DIAGNOSIS — R739 Hyperglycemia, unspecified: Secondary | ICD-10-CM

## 2014-11-30 DIAGNOSIS — L609 Nail disorder, unspecified: Secondary | ICD-10-CM

## 2014-11-30 DIAGNOSIS — I129 Hypertensive chronic kidney disease with stage 1 through stage 4 chronic kidney disease, or unspecified chronic kidney disease: Secondary | ICD-10-CM

## 2014-11-30 DIAGNOSIS — R600 Localized edema: Secondary | ICD-10-CM | POA: Insufficient documentation

## 2014-11-30 DIAGNOSIS — L602 Onychogryphosis: Secondary | ICD-10-CM | POA: Insufficient documentation

## 2014-11-30 DIAGNOSIS — N184 Chronic kidney disease, stage 4 (severe): Secondary | ICD-10-CM

## 2014-11-30 DIAGNOSIS — I10 Essential (primary) hypertension: Secondary | ICD-10-CM

## 2014-11-30 LAB — GLUCOSE, CAPILLARY: Glucose-Capillary: 107 mg/dL — ABNORMAL HIGH (ref 65–99)

## 2014-11-30 LAB — POCT GLYCOSYLATED HEMOGLOBIN (HGB A1C): Hemoglobin A1C: 5.7

## 2014-11-30 NOTE — Assessment & Plan Note (Signed)
See by Dr. Justin Mend quite recently. Kidney function generally stable, Cr ~3. Now s/p AVF placement.  -Will follow up w/ Dr. Justin Mend in ~6 months.

## 2014-11-30 NOTE — Progress Notes (Signed)
   Subjective:   Patient ID: Luke Dunn male   DOB: [birth year] 64 y.o.   MRN: [id number]  HPI: [name] is a 64 y.o. [place] speaking male w/ PMHx of HTN, Gout, CKD stage 4, and anemia, presents today for follow up visit regarding his chronic kidney disease. Patient is doing well today, only complaining of some left ankle swelling and pain. This is not a new issue for him, he states the pain and swelling is worst at the end of the day. He denies morning stiffness and does state the swelling improves if he keeps his legs elevated at night. He denies any significant weight gain, SOB, PND, orthopnea, or chest pain.  Patient was recently seen by Dr. Justin Dunn (nephrology) who states that his renal function is generally stable. Has had recent LUE AVF placement.   Patient also states that he has had an issue w/ a cockroach infestation at his apartment. His RN/aide, Luke Dunn is helping him w/ housing authority, etc.     Current Outpatient Prescriptions  Medication Sig Dispense Refill  . amLODipine (NORVASC) 10 MG tablet Take 1 tablet (10 mg total) by mouth daily. 30 tablet 5  . furosemide (LASIX) 40 MG tablet Take 1 tablet (40 mg total) by mouth daily. 90 tablet 3  . hydrocerin (EUCERIN) CREA Apply 2 application topically 2 (two) times daily. 454 g 0  . Nutritional Supplements (FEEDING SUPPLEMENT, NEPRO CARB STEADY,) LIQD Take 237 mLs by mouth 3 (three) times daily with meals. 30 Can 3  . pantoprazole (PROTONIX) 40 MG tablet TAKE ONE TABLET BY MOUTH EVERY DAY 30 tablet 5  . promethazine (PHENERGAN) 12.5 MG tablet Take 1 tablet (12.5 mg total) by mouth every 6 (six) hours as needed for nausea. 30 tablet 0  . sodium bicarbonate 650 MG tablet TAKE TWO TABLETS BY MOUTH TWICE DAILY 120 tablet 5  . traMADol (ULTRAM) 50 MG tablet Take 1 tablet (50 mg total) by mouth every 12 (twelve) hours as needed. 20 tablet 0  . ULORIC 40 MG tablet Take 2 tablets by mouth daily. 60 tablet 5   No current  facility-administered medications for this visit.   Review of Systems  General: Denies fever, diaphoresis, appetite change, and fatigue.  Respiratory: Denies SOB, cough, and wheezing.   Cardiovascular: Denies chest pain and palpitations.  Gastrointestinal: Denies nausea, vomiting, abdominal pain, and diarrhea Musculoskeletal: Positive for arthralgias. Denies myalgias, back pain, and gait problem.  Neurological: Denies dizziness, syncope, weakness, lightheadedness, and headaches.  Psychiatric/Behavioral: Denies mood changes, sleep disturbance, and agitation.    Objective:   Physical Exam: Filed Vitals:   11/30/14 1604  BP: 143/79  Pulse: 97  Temp: 97.9 F (36.6 C)  TempSrc: Oral  Height: 5\' 3"  (1.6 m)  Weight: 203 lb (92.08 kg)  SpO2: 100%   General: Alert, cooperative, NAD. HEENT: PERRL, EOMI. Moist mucus membranes Neck: Full range of motion without pain, supple, no lymphadenopathy or carotid bruits Lungs: Clear to ascultation bilaterally, normal work of respiration, no wheezes, rales, rhonchi Heart: RRR, no murmurs, gallops, or rubs Abdomen: Soft, non-tender, non-distended, BS + Extremities: No cyanosis, clubbing. LUE AVF well healed incision, good thrill/bruit. LE edema (+2), L>R.  Neurologic: Alert & oriented X3, cranial nerves II-XII intact, strength grossly intact, sensation intact to light touch   Assessment & Plan:   Please see problem based assessment and plan.

## 2014-11-30 NOTE — Patient Instructions (Signed)
1. Please make an appointment for 6-12 weeks.   2. Please take all medications as previously prescribed with the following changes:  Please elevate your legs in the evening. USE ICE on left ankle.   If swelling does not improve, call in 2 weeks, we can give you Lasix 80 mg once daily for 2 days to see if the swelling improves.   3. If you have worsening of your symptoms or new symptoms arise, please call the clinic [phone]), or go to the ER immediately if symptoms are severe.  You have done a great job in taking all your medications. Please continue to do this.

## 2014-12-01 NOTE — Assessment & Plan Note (Signed)
L>R w/ accompanied left ankle pain, worse after full day and ambulation. Most likely associated w/ mild volume overload, left ankle osteoarthritis, and Norvasc. Do not feel this is very significant at this time, will continue w/ conservative management for now. No apparent gout flare, has not had recent attack.  -Elevate LE's when able -Ice in evening -Tylenol prn for pain; can consider Tramadol prn in future if no improvement -Can also consider short increased dose of Lasix 80 mg daily for 2 days if swelling does not improve, vs changing off of amlodipine, however, feel his blood pressure is significantly benefiting from this medication.

## 2014-12-01 NOTE — Assessment & Plan Note (Signed)
Patient has overgrown toenails, has trouble cutting them himself.  -Podiatry referral

## 2014-12-01 NOTE — Assessment & Plan Note (Signed)
Noted in recent nephrology labs.  -HbA1c checked, 5.7.

## 2014-12-01 NOTE — Assessment & Plan Note (Signed)
BP Readings from Last 3 Encounters:  11/30/14 143/79  08/25/14 149/83  08/17/14 145/87    Lab Results  Component Value Date   NA 142 07/10/2014   K 4.7 07/10/2014   CREATININE 2.31* 03/31/2014    Assessment: Blood pressure control:  Mild elevation. Comments: Compliant w/ Norvasc + Lasix  Plan: Medications:  continue current medications; Norvasc is likely contributing to LE edema, however, feel the benefit of this medication outweighs adverse effect of mild LE swelling.  Other plans: RTC in 6-12 weeks. Can recheck BMP at that time.

## 2014-12-02 NOTE — Progress Notes (Signed)
Internal Medicine Clinic Attending  Case discussed with Dr. Jones soon after the resident saw the patient.  We reviewed the resident's history and exam and pertinent patient test results.  I agree with the assessment, diagnosis, and plan of care documented in the resident's note. 

## 2014-12-10 NOTE — Addendum Note (Signed)
Addended by: Hulan Fray on: 12/10/2014 07:01 AM   Modules accepted: Orders

## 2014-12-16 ENCOUNTER — Telehealth: Payer: Self-pay | Admitting: Internal Medicine

## 2014-12-16 DIAGNOSIS — M25561 Pain in right knee: Secondary | ICD-10-CM

## 2014-12-16 DIAGNOSIS — M25562 Pain in left knee: Principal | ICD-10-CM

## 2014-12-16 NOTE — Telephone Encounter (Signed)
Talked with Hoyle Sauer [phone] - swelling in legs much better. Problems with left knee and clinic gave pt Rx for Voltaren gel in past. Pt is just about out of the gel - uses [employer] [facility]. Hilda Blades Lorielle Boehning RN 12/16/14 3:30PM

## 2014-12-16 NOTE — Telephone Encounter (Signed)
Calling debbie back

## 2014-12-16 NOTE — Telephone Encounter (Signed)
Calling to give the dr a update about the patients swelling in his ankles that it was better. And patient also requesting a medication.

## 2014-12-16 NOTE — Telephone Encounter (Signed)
Message left on ID recording.

## 2014-12-18 MED ORDER — DICLOFENAC SODIUM 1 % TD GEL: 2.0000 g | Freq: Four times a day (QID) | TRANSDERMAL | Status: DC

## 2014-12-29 ENCOUNTER — Other Ambulatory Visit: Payer: Self-pay | Admitting: Internal Medicine

## 2015-01-11 ENCOUNTER — Encounter: Payer: Medicare Other | Admitting: Surgery

## 2015-01-11 ENCOUNTER — Other Ambulatory Visit (HOSPITAL_COMMUNITY): Payer: Medicare Other

## 2015-01-27 ENCOUNTER — Other Ambulatory Visit: Payer: Self-pay | Admitting: Internal Medicine

## 2015-01-27 NOTE — Telephone Encounter (Signed)
Sch appt with PCP Oct or Nov pls

## 2015-02-25 DIAGNOSIS — I10 Essential (primary) hypertension: Secondary | ICD-10-CM

## 2015-02-25 NOTE — Congregational Nurse Program (Signed)
Home visit.  BP 132/76.  States he is feeling well.  No gout pain.  Taking medications as prescribed. Pill boxes filled x 2 weeks supply.

## 2015-03-02 ENCOUNTER — Ambulatory Visit (INDEPENDENT_AMBULATORY_CARE_PROVIDER_SITE_OTHER): Payer: Medicare Other | Admitting: Internal Medicine

## 2015-03-02 VITALS — BP 126/77 | HR 98 | Temp 97.7°F | Ht 63.0 in | Wt 204.3 lb

## 2015-03-02 DIAGNOSIS — N184 Chronic kidney disease, stage 4 (severe): Secondary | ICD-10-CM

## 2015-03-02 DIAGNOSIS — Z0000 Encounter for general adult medical examination without abnormal findings: Secondary | ICD-10-CM

## 2015-03-02 DIAGNOSIS — I129 Hypertensive chronic kidney disease with stage 1 through stage 4 chronic kidney disease, or unspecified chronic kidney disease: Secondary | ICD-10-CM

## 2015-03-02 DIAGNOSIS — Z23 Encounter for immunization: Secondary | ICD-10-CM

## 2015-03-02 DIAGNOSIS — M1A39X0 Chronic gout due to renal impairment, multiple sites, without tophus (tophi): Secondary | ICD-10-CM

## 2015-03-02 DIAGNOSIS — I10 Essential (primary) hypertension: Secondary | ICD-10-CM | POA: Diagnosis not present

## 2015-03-02 DIAGNOSIS — D509 Iron deficiency anemia, unspecified: Secondary | ICD-10-CM | POA: Diagnosis not present

## 2015-03-02 MED ORDER — FUROSEMIDE 40 MG PO TABS: 40.0000 mg | ORAL_TABLET | Freq: Every day | ORAL | Status: DC

## 2015-03-02 MED ORDER — SODIUM BICARBONATE 650 MG PO TABS: 1300.0000 mg | ORAL_TABLET | Freq: Two times a day (BID) | ORAL | Status: DC

## 2015-03-02 NOTE — Patient Instructions (Signed)
General Instructions:  1. Please schedule a follow up appointment for 6 months.   2. Please take all medications as previously prescribed.  3. If you have worsening of your symptoms or new symptoms arise, please call the clinic [phone]), or go to the ER immediately if symptoms are severe.  You have done a great job in taking all your medications. Please continue to do this.  Thank you for bringing your medicines today. This helps [place] keep you safe from mistakes.   Progress Toward Treatment Goals:  Treatment Goal 03/02/2015  Blood pressure at goal    Self Care Goals & Plans:  Self Care Goal 03/02/2015  Manage my medications take my medicines as prescribed; bring my medications to every visit; refill my medications on time  Monitor my health -  Eat healthy foods drink diet soda or water instead of juice or soda; eat more vegetables; eat foods that are low in salt  Be physically active -  Meeting treatment goals maintain the current self-care plan    No flowsheet data found.   Care Management & Community Referrals:  Referral 03/02/2015  Referrals made for care management support none needed  Referrals made to community resources none

## 2015-03-02 NOTE — Congregational Nurse Program (Signed)
Home visit to fill pill box.  Accompanied patient to [facility] visit with Dr. Luanne Bras.  No medication changes. BP goal met.  Return in 6 months.

## 2015-03-02 NOTE — Progress Notes (Signed)
   Subjective:   Patient ID: Luke Dunn male   DOB: 1951-05-10 64 y.o.   MRN: [id number]  HPI: [name] is a 64 y.o. [place] speaking male w/ PMHx of HTN, Gout, CKD stage 4, and anemia, presents today for follow up visit regarding his chronic kidney disease and Gout. Patient has no significant complaints today. Says he is felling quite well, no abdominal pain, joint pain, or difficulty ambulating as he has described in the past. Patient has also had recent issues w/ cockroaches in his house, however, this has resolved since the housing authority "sprayed" for them.   Current Outpatient Prescriptions  Medication Sig Dispense Refill  . amLODipine (NORVASC) 10 MG tablet TAKE 1 TABLET BY MOUTH EVERY DAY 30 tablet 5  . diclofenac sodium (VOLTAREN) 1 % GEL Apply 2 g topically 4 (four) times daily. 100 g 1  . furosemide (LASIX) 40 MG tablet Take 1 tablet (40 mg total) by mouth daily. 90 tablet 3  . hydrocerin (EUCERIN) CREA Apply 2 application topically 2 (two) times daily. 454 g 0  . Nutritional Supplements (FEEDING SUPPLEMENT, NEPRO CARB STEADY,) LIQD Take 237 mLs by mouth 3 (three) times daily with meals. 30 Can 3  . pantoprazole (PROTONIX) 40 MG tablet TAKE ONE TABLET BY MOUTH EVERY DAY 30 tablet 5  . sodium bicarbonate 650 MG tablet Take 2 tablets (1,300 mg total) by mouth 2 (two) times daily. 120 tablet 5  . traMADol (ULTRAM) 50 MG tablet Take 1 tablet (50 mg total) by mouth every 12 (twelve) hours as needed. 20 tablet 0  . ULORIC 40 MG tablet Take 2 tablets by mouth daily. 90 tablet 1   No current facility-administered medications for this visit.   Review of Systems  General: Denies fever, diaphoresis, appetite change, and fatigue.  Respiratory: Denies SOB, cough, and wheezing.   Cardiovascular: Denies chest pain and palpitations.  Gastrointestinal: Denies nausea, vomiting, abdominal pain, and diarrhea Musculoskeletal: Denies myalgias, arthralgias, back pain, and gait problem.    Neurological: Denies dizziness, syncope, weakness, lightheadedness, and headaches.  Psychiatric/Behavioral: Denies mood changes, sleep disturbance, and agitation.   Objective:   Physical Exam: Filed Vitals:   03/02/15 1526  BP: 126/77  Pulse: 98  Temp: 97.7 F (36.5 C)  TempSrc: Oral  Height: 5\' 3"  (1.6 m)  Weight: 204 lb 4.8 oz (92.67 kg)  SpO2: 97%     General: Alert, cooperative, NAD. HEENT: PERRL, EOMI. Moist mucus membranes. Poor dentition.  Neck: Full range of motion without pain, supple, no lymphadenopathy or carotid bruits Lungs: Clear to ascultation bilaterally, normal work of respiration, no wheezes, rales, rhonchi Heart: RRR, no murmurs, gallops, or rubs Abdomen: Soft, non-tender, non-distended, BS + Extremities: No cyanosis, clubbing, or edema Neurologic: Alert & oriented X3, cranial nerves II-XII intact, strength grossly intact, sensation intact to light touch    Assessment & Plan:   Please see problem based assessment and plan.

## 2015-03-03 LAB — CBC WITH DIFFERENTIAL/PLATELET
Basophils Absolute: 0 10*3/uL (ref 0.0–0.2)
Basos: 0 %
EOS (ABSOLUTE): 0.2 10*3/uL (ref 0.0–0.4)
EOS: 4 %
HEMATOCRIT: 37.4 % — AB (ref 37.5–51.0)
Hemoglobin: 12.2 g/dL — ABNORMAL LOW (ref 12.6–17.7)
Immature Grans (Abs): 0 10*3/uL (ref 0.0–0.1)
Immature Granulocytes: 0 %
Lymphocytes Absolute: 1.2 10*3/uL (ref 0.7–3.1)
Lymphs: 21 %
MCH: 25.5 pg — ABNORMAL LOW (ref 26.6–33.0)
MCHC: 32.6 g/dL (ref 31.5–35.7)
MCV: 78 fL — AB (ref 79–97)
MONOCYTES: 8 %
Monocytes Absolute: 0.5 10*3/uL (ref 0.1–0.9)
NEUTROS PCT: 67 %
Neutrophils Absolute: 3.9 10*3/uL (ref 1.4–7.0)
Platelets: 195 10*3/uL (ref 150–379)
RBC: 4.79 x10E6/uL (ref 4.14–5.80)
RDW: 16.7 % — ABNORMAL HIGH (ref 12.3–15.4)
WBC: 5.8 10*3/uL (ref 3.4–10.8)

## 2015-03-03 LAB — BMP8+ANION GAP
Anion Gap: 19 mmol/L — ABNORMAL HIGH (ref 10.0–18.0)
BUN/Creatinine Ratio: 11 (ref 10–22)
BUN: 36 mg/dL — ABNORMAL HIGH (ref 8–27)
CALCIUM: 9.5 mg/dL (ref 8.6–10.2)
CO2: 28 mmol/L (ref 18–29)
Chloride: 93 mmol/L — ABNORMAL LOW (ref 97–108)
Creatinine, Ser: 3.31 mg/dL — ABNORMAL HIGH (ref 0.76–1.27)
GFR calc Af Amer: 21 mL/min/{1.73_m2} — ABNORMAL LOW (ref >59)
GFR, EST NON AFRICAN AMERICAN: 18 mL/min/{1.73_m2} — AB (ref >59)
Glucose: 130 mg/dL — ABNORMAL HIGH (ref 65–99)
POTASSIUM: 4.3 mmol/L (ref 3.5–5.2)
Sodium: 140 mmol/L (ref 134–144)

## 2015-03-03 NOTE — Assessment & Plan Note (Signed)
BP Readings from Last 3 Encounters:  03/02/15 126/77  02/25/15 132/76  11/30/14 143/79    Lab Results  Component Value Date   NA 140 03/02/2015   K 4.3 03/02/2015   CREATININE 3.31* 03/02/2015    Assessment: Blood pressure control: controlled Progress toward BP goal:  at goal Comments: Compliant w/ Norvasc + Lasix  Plan: Medications:  continue current medications Other plans: Repeat BMP shows increasing Cr but no electrolyte abnormalities. Follows w/ Dr. Justin Mend. Will follow up in December.

## 2015-03-03 NOTE — Assessment & Plan Note (Signed)
No acute abnormalities.  -Continue Uloric 40 mg daily.

## 2015-03-03 NOTE — Assessment & Plan Note (Signed)
Flu shot given today

## 2015-03-03 NOTE — Assessment & Plan Note (Signed)
Repeat BMP as follows:  BMP Latest Ref Rng 03/02/2015 07/10/2014 03/31/2014  Glucose 65 - 99 mg/dL 130(H) 98 151(H)  BUN 8 - 27 mg/dL 36(H) - 35(H)  Creatinine 0.76 - 1.27 mg/dL 3.31(H) - 2.31(H)  BUN/Creat Ratio 10 - 22 11 - -  Sodium 134 - 144 mmol/L 140 142 144  Potassium 3.5 - 5.2 mmol/L 4.3 4.7 4.8  Chloride 97 - 108 mmol/L 93(L) - 103  CO2 18 - 29 mmol/L 28 - 25  Calcium 8.6 - 10.2 mg/dL 9.5 - 9.0  -Cr has increased but no electrolyte abnormalities. Follows w/ Dr. Justin Mend, will see in 04/2015.  -RTC in 6 months.

## 2015-03-03 NOTE — Assessment & Plan Note (Signed)
Checked CBC today, only mild microcytic anemia, most likely multifactorial, related to IDA and renal disease. Can repeat iron studies at next visit and consider starting iron supplementation if warranted.

## 2015-03-04 NOTE — Progress Notes (Signed)
Internal Medicine Clinic Attending  Case discussed with Dr. Jones at the time of the visit.  We reviewed the resident's history and exam and pertinent patient test results.  I agree with the assessment, diagnosis, and plan of care documented in the resident's note.  

## 2015-03-17 DIAGNOSIS — I10 Essential (primary) hypertension: Secondary | ICD-10-CM

## 2015-03-17 DIAGNOSIS — Z79899 Other long term (current) drug therapy: Secondary | ICD-10-CM

## 2015-03-17 NOTE — Congregational Nurse Program (Signed)
Home visit to fill pillbox.  BP 142/80.  Taking meds. As prescribed.  Asked Patient about setting up dental appointment and he said he does not want to go because "too much hurt."  Will continue to encourage and educate regarding need to see dentist.

## 2015-03-31 DIAGNOSIS — I10 Essential (primary) hypertension: Secondary | ICD-10-CM

## 2015-03-31 DIAGNOSIS — Z79899 Other long term (current) drug therapy: Secondary | ICD-10-CM

## 2015-03-31 NOTE — Congregational Nurse Program (Signed)
Home visit. Medication boxes filled for 2 weeks.  BP 148/88.

## 2015-04-06 ENCOUNTER — Encounter: Payer: Medicare Other | Admitting: Dietician

## 2015-04-16 DIAGNOSIS — I10 Essential (primary) hypertension: Secondary | ICD-10-CM

## 2015-04-16 DIAGNOSIS — Z79899 Other long term (current) drug therapy: Secondary | ICD-10-CM

## 2015-04-19 NOTE — Congregational Nurse Program (Signed)
Home visit.  BP 136/88.  Medication boxes filled for 2 weeks.

## 2015-04-28 DIAGNOSIS — Z79899 Other long term (current) drug therapy: Secondary | ICD-10-CM

## 2015-04-28 DIAGNOSIS — I10 Essential (primary) hypertension: Secondary | ICD-10-CM

## 2015-04-28 NOTE — Congregational Nurse Program (Signed)
Home visit. Med. Boxes filled for 2 weeks.  BP 150/88.  Discussed exercise and low sodium diet.  States he walks everyday weather permitting.  He does use fish sauce periodically. Suggested he either eliminate or reduce amount of fish sauce and limit salt. Has 05/11/15 appointment with Dr. Justin Mend at [facility]

## 2015-05-11 DIAGNOSIS — N2581 Secondary hyperparathyroidism of renal origin: Secondary | ICD-10-CM | POA: Diagnosis not present

## 2015-05-11 DIAGNOSIS — E785 Hyperlipidemia, unspecified: Secondary | ICD-10-CM | POA: Diagnosis not present

## 2015-05-11 DIAGNOSIS — N179 Acute kidney failure, unspecified: Secondary | ICD-10-CM

## 2015-05-11 DIAGNOSIS — N183 Chronic kidney disease, stage 3 (moderate): Secondary | ICD-10-CM | POA: Diagnosis not present

## 2015-05-11 DIAGNOSIS — N189 Chronic kidney disease, unspecified: Secondary | ICD-10-CM | POA: Diagnosis not present

## 2015-05-11 DIAGNOSIS — D631 Anemia in chronic kidney disease: Secondary | ICD-10-CM | POA: Diagnosis not present

## 2015-05-11 NOTE — Congregational Nurse Program (Signed)
Home visit to remind patient of today's appointment with Dr. Edrick Oh at [facility].Pill box filled for 2 weeks.  Accompanied patient to appointment with Dr. Justin Mend who told him that he reviewed labork from 03/02/15 appointment at [facility] and told him results  remain stable.  Kidney function remains at 20%.  Vital sign WNL today.  Heart and lungs sound good and no edema present.  Additional lab work ordered for today and patient is to return in 61 mos.

## 2015-05-19 DIAGNOSIS — I10 Essential (primary) hypertension: Secondary | ICD-10-CM

## 2015-05-19 DIAGNOSIS — Z79899 Other long term (current) drug therapy: Secondary | ICD-10-CM

## 2015-05-20 NOTE — Congregational Nurse Program (Signed)
Congregational Nurse Program Note  Date of Encounter: 05/19/2015  Past Medical History: Past Medical History  Diagnosis Date  . Gout   . CKD (chronic kidney disease) stage 4, GFR 15-29 ml/min     Followed by [facility].  Thought to be due to long-standing HTN  . Essential hypertension   . Gout 05/04/2009    Qualifier: Diagnosis of  By: Dyann Kief MD, Clifton James    . Barrett's esophagus   . Edema   . GERD (gastroesophageal reflux disease)     Encounter Details:     CNP Questionnaire - 05/19/15 1730    Patient Demographics   Is this a new or existing patient? Existing   Patient is considered a/an Refugee   Race Asian   Patient Assistance   Location of Patient Assistance Not Applicable   Patient's financial/insurance status --  Medicare   Uninsured Patient No   Patient referred to apply for the following financial assistance Not Applicable   Food insecurities addressed Not Applicable   Transportation assistance No   Assistance securing medications No   Educational health offerings Medications   Encounter Details   Primary purpose of visit Chronic Illness/Condition Visit   Was an Emergency Department visit averted? Not Applicable   Does patient have a medical provider? Yes   Patient referred to Not Applicable   Was a mental health screening completed? (GAINS tool) No   Does patient have dental issues? No   Was a dental referral made? No   Since previous encounter, have you referred patient for abnormal blood pressure that resulted in a new diagnosis or medication change? No   Since previous encounter, have you referred patient for abnormal blood glucose that resulted in a new diagnosis or medication change? No   For Abstraction Use Only   Does patient have insurance? Yes       Home visit with [facility] interpreter.  BP 14/80.  Pt. States he is doing well.  Medication box filled.  Helped him complete an application for low income energy assistance.

## 2015-06-02 DIAGNOSIS — I10 Essential (primary) hypertension: Secondary | ICD-10-CM

## 2015-06-02 DIAGNOSIS — Z79899 Other long term (current) drug therapy: Secondary | ICD-10-CM

## 2015-06-02 NOTE — Congregational Nurse Program (Signed)
Congregational Nurse Program Note  Date of Encounter: 06/02/2015  Past Medical History: Past Medical History  Diagnosis Date  . Gout   . CKD (chronic kidney disease) stage 4, GFR 15-29 ml/min     Followed by [facility].  Thought to be due to long-standing HTN  . Essential hypertension   . Gout 05/04/2009    Qualifier: Diagnosis of  By: Dyann Kief MD, Clifton James    . Barrett's esophagus   . Edema   . GERD (gastroesophageal reflux disease)     Encounter Details:     CNP Questionnaire - 06/02/15 1738    Patient Demographics   Is this a new or existing patient? Existing   Patient is considered a/an Refugee   Race Asian   Patient Assistance   Location of Patient Assistance Not Applicable   Patient's financial/insurance status Low Income;Medicaid;Medicare   Uninsured Patient No   Patient referred to apply for the following financial assistance Not Applicable   Food insecurities addressed Not Applicable   Transportation assistance No   Assistance securing medications No   Educational health offerings Medications   Encounter Details   Primary purpose of visit Chronic Illness/Condition Visit   Was an Emergency Department visit averted? Not Applicable   Does patient have a medical provider? Yes   Patient referred to Not Applicable   Was a mental health screening completed? (GAINS tool) No   Does patient have dental issues? No   Was a dental referral made? No   Since previous encounter, have you referred patient for abnormal blood pressure that resulted in a new diagnosis or medication change? No   Since previous encounter, have you referred patient for abnormal blood glucose that resulted in a new diagnosis or medication change? No   For Abstraction Use Only   Does patient have insurance? Yes       BP 140/88.  Filled pill box for 2 weeks.  Showed patient where he had missed 3 evening doses of the sodium bicarb.  Stated he forgot it ut will start taking it just before bed  everyday.

## 2015-06-11 ENCOUNTER — Other Ambulatory Visit: Payer: Self-pay | Admitting: Internal Medicine

## 2015-06-11 NOTE — Telephone Encounter (Signed)
Next scheduled visit 5/2 /17

## 2015-06-16 DIAGNOSIS — Z79899 Other long term (current) drug therapy: Secondary | ICD-10-CM

## 2015-06-16 DIAGNOSIS — I10 Essential (primary) hypertension: Secondary | ICD-10-CM

## 2015-06-16 NOTE — Congregational Nurse Program (Signed)
Congregational Nurse Program Note  Date of Encounter: 06/16/2015  Past Medical History: Past Medical History  Diagnosis Date  . Gout   . CKD (chronic kidney disease) stage 4, GFR 15-29 ml/min     Followed by [facility].  Thought to be due to long-standing HTN  . Essential hypertension   . Gout 05/04/2009    Qualifier: Diagnosis of  By: Dyann Kief MD, Clifton James    . Barrett's esophagus   . Edema   . GERD (gastroesophageal reflux disease)     Encounter Details:     CNP Questionnaire - 06/16/15 1723    Patient Demographics   Is this a new or existing patient? Existing   Patient is considered a/an Refugee   Race Asian   Patient Assistance   Location of Patient Assistance Not Applicable   Patient's financial/insurance status Low Income;Medicaid;Medicare   Uninsured Patient No   Patient referred to apply for the following financial assistance Not Applicable   Food insecurities addressed Not Applicable   Transportation assistance No   Assistance securing medications No   Educational health offerings Medications   Encounter Details   Primary purpose of visit Chronic Illness/Condition Visit   Was an Emergency Department visit averted? Not Applicable   Does patient have a medical provider? Yes   Patient referred to Not Applicable   Was a mental health screening completed? (GAINS tool) No   Does patient have dental issues? No   Was a dental referral made? No   Since previous encounter, have you referred patient for abnormal blood pressure that resulted in a new diagnosis or medication change? No   Since previous encounter, have you referred patient for abnormal blood glucose that resulted in a new diagnosis or medication change? No   For Abstraction Use Only   Does patient have insurance? Yes       BP 144/80.  Medication boxes filled for 2 weeks.  Again pointed out to patient that he had missed 3 of the evening doses of the sodium bicarb.

## 2015-06-23 DIAGNOSIS — Z79899 Other long term (current) drug therapy: Secondary | ICD-10-CM

## 2015-06-23 DIAGNOSIS — Z719 Counseling, unspecified: Secondary | ICD-10-CM

## 2015-06-23 NOTE — Congregational Nurse Program (Signed)
Congregational Nurse Program Note  Date of Encounter: 06/23/2015  Past Medical History: Past Medical History  Diagnosis Date  . Gout   . CKD (chronic kidney disease) stage 4, GFR 15-29 ml/min     Followed by [facility].  Thought to be due to long-standing HTN  . Essential hypertension   . Gout 05/04/2009    Qualifier: Diagnosis of  By: Dyann Kief MD, Clifton James    . Barrett's esophagus   . Edema   . GERD (gastroesophageal reflux disease)     Encounter Details:     CNP Questionnaire - 06/23/15 2040    Patient Demographics   Is this a new or existing patient? Existing   Patient is considered a/an Refugee   Race Asian   Patient Assistance   Location of Patient Assistance Not Applicable   Patient's financial/insurance status Low Income;Medicaid;Medicare   Uninsured Patient No   Patient referred to apply for the following financial assistance Not Applicable   Food insecurities addressed Not Applicable   Transportation assistance No   Assistance securing medications Yes   Educational health offerings Medications   Encounter Details   Primary purpose of visit Chronic Illness/Condition Visit;Other   Was an Emergency Department visit averted? Not Applicable   Does patient have a medical provider? Yes   Patient referred to Not Applicable   Was a mental health screening completed? (GAINS tool) No   Does patient have dental issues? No   Was a dental referral made? No   Since previous encounter, have you referred patient for abnormal blood pressure that resulted in a new diagnosis or medication change? No   Since previous encounter, have you referred patient for abnormal blood glucose that resulted in a new diagnosis or medication change? No   For Abstraction Use Only   Does patient have insurance? Yes       Home visit.  Delivered medications that were picked up from [place] and filled pill box x 1 week.  Assisted with food stamp renewal form.

## 2015-06-30 DIAGNOSIS — I10 Essential (primary) hypertension: Secondary | ICD-10-CM

## 2015-06-30 DIAGNOSIS — Z79899 Other long term (current) drug therapy: Secondary | ICD-10-CM

## 2015-06-30 NOTE — Congregational Nurse Program (Signed)
Congregational Nurse Program Note  Date of Encounter: 06/30/2015  Past Medical History: Past Medical History  Diagnosis Date  . Gout   . CKD (chronic kidney disease) stage 4, GFR 15-29 ml/min     Followed by [facility].  Thought to be due to long-standing HTN  . Essential hypertension   . Gout 05/04/2009    Qualifier: Diagnosis of  By: Dyann Kief MD, Clifton James    . Barrett's esophagus   . Edema   . GERD (gastroesophageal reflux disease)     Encounter Details:     CNP Questionnaire - 06/30/15 2107    Patient Demographics   Is this a new or existing patient? Existing   Patient is considered a/an Refugee   Race Asian   Patient Assistance   Location of Patient Assistance Not Applicable   Patient's financial/insurance status Low Income;Medicaid;Medicare   Uninsured Patient No   Patient referred to apply for the following financial assistance Not Applicable   Food insecurities addressed Not Applicable   Transportation assistance No   Assistance securing medications Yes   Educational health offerings Medications   Encounter Details   Primary purpose of visit Chronic Illness/Condition Visit;Other   Was an Emergency Department visit averted? Not Applicable   Does patient have a medical provider? Yes   Patient referred to Not Applicable   Was a mental health screening completed? (GAINS tool) No   Does patient have dental issues? No   Was a dental referral made? No   Does patient have vision issues? No   Since previous encounter, have you referred patient for abnormal blood pressure that resulted in a new diagnosis or medication change? No   Since previous encounter, have you referred patient for abnormal blood glucose that resulted in a new diagnosis or medication change? No   For Abstraction Use Only   Does patient have insurance? Yes       Home visit with interpreter Luke Dunn.  BP 140/82. Picked up Sodium Bicarb from pharmacy and filled pill boxes for 2 weeks.

## 2015-07-08 ENCOUNTER — Other Ambulatory Visit: Payer: Self-pay | Admitting: Internal Medicine

## 2015-07-14 DIAGNOSIS — Z79899 Other long term (current) drug therapy: Secondary | ICD-10-CM

## 2015-07-14 DIAGNOSIS — I10 Essential (primary) hypertension: Secondary | ICD-10-CM

## 2015-07-14 NOTE — Congregational Nurse Program (Signed)
Congregational Nurse Program Note  Date of Encounter: 07/14/2015  Past Medical History: Past Medical History  Diagnosis Date  . Gout   . CKD (chronic kidney disease) stage 4, GFR 15-29 ml/min     Followed by [facility].  Thought to be due to long-standing HTN  . Essential hypertension   . Gout 05/04/2009    Qualifier: Diagnosis of  By: Dyann Kief MD, Clifton James    . Barrett's esophagus   . Edema   . GERD (gastroesophageal reflux disease)     Encounter Details:     CNP Questionnaire - 07/14/15 1411    Patient Demographics   Is this a new or existing patient? Existing   Patient is considered a/an Refugee   Race Asian   Patient Assistance   Location of Patient Assistance Not Applicable   Patient's financial/insurance status Low Income;Medicaid;Medicare   Uninsured Patient No   Patient referred to apply for the following financial assistance Not Applicable   Food insecurities addressed Not Applicable   Transportation assistance No   Assistance securing medications Yes   Type of Assistance [facility]   Educational health offerings Medications   Encounter Details   Primary purpose of visit Chronic Illness/Condition Visit;Other   Was an Emergency Department visit averted? Not Applicable   Does patient have a medical provider? Yes   Patient referred to Not Applicable   Was a mental health screening completed? (GAINS tool) No   Does patient have dental issues? No   Was a dental referral made? No   Does patient have vision issues? No   Since previous encounter, have you referred patient for abnormal blood pressure that resulted in a new diagnosis or medication change? No   Since previous encounter, have you referred patient for abnormal blood glucose that resulted in a new diagnosis or medication change? No   For Abstraction Use Only   Does patient have insurance? Yes       BP 130/76.  Filled pill boxes x 2 weeks.

## 2015-07-21 ENCOUNTER — Other Ambulatory Visit: Payer: Self-pay | Admitting: Internal Medicine

## 2015-07-28 DIAGNOSIS — I10 Essential (primary) hypertension: Secondary | ICD-10-CM

## 2015-07-28 DIAGNOSIS — Z79899 Other long term (current) drug therapy: Secondary | ICD-10-CM

## 2015-07-28 DIAGNOSIS — Z719 Counseling, unspecified: Secondary | ICD-10-CM

## 2015-07-28 NOTE — Congregational Nurse Program (Signed)
Congregational Nurse Program Note  Date of Encounter: 07/28/2015  Past Medical History: Past Medical History  Diagnosis Date  . Gout   . CKD (chronic kidney disease) stage 4, GFR 15-29 ml/min     Followed by [facility].  Thought to be due to long-standing HTN  . Essential hypertension   . Gout 05/04/2009    Qualifier: Diagnosis of  By: Dyann Kief MD, Clifton James    . Barrett's esophagus   . Edema   . GERD (gastroesophageal reflux disease)     Encounter Details:     CNP Questionnaire - 07/28/15 2128    Patient Demographics   Is this a new or existing patient? Existing   Patient is considered a/an Refugee   Race Asian   Patient Assistance   Location of Patient Assistance Not Applicable   Patient's financial/insurance status Low Income;Medicaid;Medicare   Uninsured Patient No   Patient referred to apply for the following financial assistance Not Applicable   Food insecurities addressed Not Applicable   Transportation assistance No   Assistance securing medications Yes   Type of Assistance [facility]   Educational health offerings Other;Hypertension;Nutrition   Encounter Details   Primary purpose of visit Chronic Illness/Condition Visit;Other   Was an Emergency Department visit averted? Not Applicable   Does patient have a medical provider? Yes   Patient referred to Not Applicable   Was a mental health screening completed? (GAINS tool) No   Does patient have dental issues? No   Was a dental referral made? No   Does patient have vision issues? No   Since previous encounter, have you referred patient for abnormal blood pressure that resulted in a new diagnosis or medication change? No   Since previous encounter, have you referred patient for abnormal blood glucose that resulted in a new diagnosis or medication change? No   For Abstraction Use Only   Does patient have insurance? Yes       Home Visit--BP 150/82.  States he has been eating a lot of fish sauce.   Encouraged to decrease intake.  Will recheck BP in 2 weeks.  Filled pill boxes x 2 weeks. Also assisted with obtaining missing documents for LIE[employer] (Energy assistance).

## 2015-08-11 DIAGNOSIS — I10 Essential (primary) hypertension: Secondary | ICD-10-CM

## 2015-08-11 DIAGNOSIS — Z719 Counseling, unspecified: Secondary | ICD-10-CM

## 2015-08-11 DIAGNOSIS — Z79899 Other long term (current) drug therapy: Secondary | ICD-10-CM

## 2015-08-11 NOTE — Congregational Nurse Program (Signed)
Congregational Nurse Program Note  Date of Encounter: 08/11/2015  Past Medical History: Past Medical History  Diagnosis Date  . Gout   . CKD (chronic kidney disease) stage 4, GFR 15-29 ml/min     Followed by [facility].  Thought to be due to long-standing HTN  . Essential hypertension   . Gout 05/04/2009    Qualifier: Diagnosis of  By: Dyann Kief MD, Clifton James    . Barrett's esophagus   . Edema   . GERD (gastroesophageal reflux disease)     Encounter Details:     CNP Questionnaire - 08/11/15 1656    Patient Demographics   Is this a new or existing patient? Existing   Patient is considered a/an Refugee   Race Asian   Patient Assistance   Location of Patient Assistance Not Applicable   Patient's financial/insurance status Low Income;Medicaid;Medicare   Uninsured Patient No   Patient referred to apply for the following financial assistance Not Applicable   Food insecurities addressed Not Applicable   Transportation assistance No   Assistance securing medications Yes   Type of Assistance [facility]   Educational health offerings Other;Hypertension;Nutrition   Encounter Details   Primary purpose of visit Chronic Illness/Condition Visit;Other   Was an Emergency Department visit averted? Not Applicable   Does patient have a medical provider? Yes   Patient referred to Not Applicable   Was a mental health screening completed? (GAINS tool) No   Does patient have dental issues? No   Was a dental referral made? No   Does patient have vision issues? No   Since previous encounter, have you referred patient for abnormal blood pressure that resulted in a new diagnosis or medication change? No   Since previous encounter, have you referred patient for abnormal blood glucose that resulted in a new diagnosis or medication change? No   For Abstraction Use Only   Does patient have insurance? Yes       Home visit with Interpreter Luke Dunn.  Filled pill box for 2 weeks.  BP  140/84.  Pt. States he is doing well. No complaints.  [name] RN, Congregational Nurse [phone]

## 2015-08-13 DIAGNOSIS — Z79899 Other long term (current) drug therapy: Secondary | ICD-10-CM

## 2015-08-13 NOTE — Congregational Nurse Program (Signed)
Congregational Nurse Program Note  Date of Encounter: 08/13/2015  Past Medical History: Past Medical History  Diagnosis Date  . Gout   . CKD (chronic kidney disease) stage 4, GFR 15-29 ml/min     Followed by [facility].  Thought to be due to long-standing HTN  . Essential hypertension   . Gout 05/04/2009    Qualifier: Diagnosis of  By: Dyann Kief MD, Clifton James    . Barrett's esophagus   . Edema   . GERD (gastroesophageal reflux disease)     Encounter Details:     CNP Questionnaire - 08/13/15 1127    Patient Demographics   Is this a new or existing patient? Existing   Patient is considered a/an Refugee   Race Asian   Patient Assistance   Location of Patient Assistance Not Applicable   Patient's financial/insurance status Low Income;Medicaid;Medicare   Uninsured Patient No   Patient referred to apply for the following financial assistance Not Applicable   Food insecurities addressed Not Applicable   Transportation assistance No   Assistance securing medications Yes   Type of Assistance [facility]   Educational health offerings Medications   Encounter Details   Primary purpose of visit Chronic Illness/Condition Visit;Other   Was an Emergency Department visit averted? Not Applicable   Does patient have a medical provider? Yes   Patient referred to Not Applicable   Was a mental health screening completed? (GAINS tool) No   Does patient have dental issues? No   Was a dental referral made? No   Does patient have vision issues? No   Since previous encounter, have you referred patient for abnormal blood pressure that resulted in a new diagnosis or medication change? No   Since previous encounter, have you referred patient for abnormal blood glucose that resulted in a new diagnosis or medication change? No   For Abstraction Use Only   Does patient have insurance? Yes       Home visit to complete filling pill boxes.  He did not have enough Sodium Bicarb for 2 weeks on  08/11/2015 visit.  CN picked up prescription at [facility] and took it to patient today.  [name] RN, Congregational Nurse [phone]

## 2015-08-25 DIAGNOSIS — I10 Essential (primary) hypertension: Secondary | ICD-10-CM

## 2015-08-25 DIAGNOSIS — Z79899 Other long term (current) drug therapy: Secondary | ICD-10-CM

## 2015-08-25 NOTE — Congregational Nurse Program (Signed)
Congregational Nurse Program Note  Date of Encounter: 08/25/2015  Past Medical History: Past Medical History  Diagnosis Date  . Gout   . CKD (chronic kidney disease) stage 4, GFR 15-29 ml/min     Followed by [facility].  Thought to be due to long-standing HTN  . Essential hypertension   . Gout 05/04/2009    Qualifier: Diagnosis of  By: Dyann Kief MD, Clifton James    . Barrett's esophagus   . Edema   . GERD (gastroesophageal reflux disease)     Encounter Details:     CNP Questionnaire - 08/25/15 1847    Patient Demographics   Is this a new or existing patient? Existing   Patient is considered a/an Refugee   Race Asian   Patient Assistance   Location of Patient Assistance Not Applicable   Patient's financial/insurance status Low Income;Medicaid;Medicare   Uninsured Patient No   Patient referred to apply for the following financial assistance Not Applicable   Food insecurities addressed Not Applicable   Transportation assistance No   Assistance securing medications Yes   Type of Assistance [facility]   Educational health offerings Medications   Encounter Details   Primary purpose of visit Chronic Illness/Condition Visit;Other   Was an Emergency Department visit averted? Not Applicable   Does patient have a medical provider? Yes   Patient referred to Not Applicable   Was a mental health screening completed? (GAINS tool) No   Does patient have dental issues? No   Was a dental referral made? No   Does patient have vision issues? No   Since previous encounter, have you referred patient for abnormal blood pressure that resulted in a new diagnosis or medication change? No   Since previous encounter, have you referred patient for abnormal blood glucose that resulted in a new diagnosis or medication change? No   For Abstraction Use Only   Does patient have insurance? Yes       Home visit.  BP 140/84.  Pill boxes filled for 2 weeks. [name] RN, Congregational Nurse   [phone]

## 2015-09-08 DIAGNOSIS — Z79899 Other long term (current) drug therapy: Secondary | ICD-10-CM

## 2015-09-08 DIAGNOSIS — I10 Essential (primary) hypertension: Secondary | ICD-10-CM

## 2015-09-08 NOTE — Congregational Nurse Program (Signed)
Congregational Nurse Program Note  Date of Encounter: 09/08/2015  Past Medical History: Past Medical History  Diagnosis Date  . Gout   . CKD (chronic kidney disease) stage 4, GFR 15-29 ml/min     Followed by [facility].  Thought to be due to long-standing HTN  . Essential hypertension   . Gout 05/04/2009    Qualifier: Diagnosis of  By: Dyann Kief MD, Clifton James    . Barrett's esophagus   . Edema   . GERD (gastroesophageal reflux disease)     Encounter Details:     CNP Questionnaire - 09/08/15 1911    Patient Demographics   Is this a new or existing patient? Existing   Patient is considered a/an Refugee   Race Asian   Patient Assistance   Location of Patient Assistance Not Applicable   Patient's financial/insurance status Low Income;Medicaid;Medicare   Uninsured Patient No   Patient referred to apply for the following financial assistance Not Applicable   Food insecurities addressed Not Applicable   Transportation assistance No   Assistance securing medications No   Educational health offerings Not Applicable   Encounter Details   Primary purpose of visit Chronic Illness/Condition Visit;Other   Was an Emergency Department visit averted? Not Applicable   Does patient have a medical provider? Yes   Patient referred to Not Applicable   Was a mental health screening completed? (GAINS tool) No   Does patient have dental issues? No   Was a dental referral made? No   Does patient have vision issues? No   Since previous encounter, have you referred patient for abnormal blood pressure that resulted in a new diagnosis or medication change? No   Since previous encounter, have you referred patient for abnormal blood glucose that resulted in a new diagnosis or medication change? No   For Abstraction Use Only   Does patient have insurance? Yes       Home visit.  Filled pill boxes for 2 weeks.  BP 140/76.  Patient states he is doing "fine." Informed him of MD appointment with Dr.  Ronnald Ramp on May 2,2017 @ 2:15 pm. [name] RN, Congregational Nurse. [phone].

## 2015-09-21 ENCOUNTER — Encounter: Payer: Self-pay | Admitting: Internal Medicine

## 2015-09-21 ENCOUNTER — Ambulatory Visit (INDEPENDENT_AMBULATORY_CARE_PROVIDER_SITE_OTHER): Payer: Medicare Other | Admitting: Internal Medicine

## 2015-09-21 ENCOUNTER — Ambulatory Visit (HOSPITAL_COMMUNITY)
Admission: RE | Admit: 2015-09-21 | Discharge: 2015-09-21 | Disposition: A | Discharge status: Home / Self Care | Payer: Medicare Other | Source: Ambulatory Visit | Attending: Cardiology | Admitting: Cardiology

## 2015-09-21 VITALS — BP 143/77 | HR 101 | Temp 99.0°F | Ht 63.0 in | Wt 210.1 lb

## 2015-09-21 DIAGNOSIS — N184 Chronic kidney disease, stage 4 (severe): Secondary | ICD-10-CM | POA: Insufficient documentation

## 2015-09-21 DIAGNOSIS — I129 Hypertensive chronic kidney disease with stage 1 through stage 4 chronic kidney disease, or unspecified chronic kidney disease: Secondary | ICD-10-CM | POA: Insufficient documentation

## 2015-09-21 DIAGNOSIS — K219 Gastro-esophageal reflux disease without esophagitis: Secondary | ICD-10-CM | POA: Insufficient documentation

## 2015-09-21 DIAGNOSIS — Z719 Counseling, unspecified: Secondary | ICD-10-CM

## 2015-09-21 DIAGNOSIS — M7989 Other specified soft tissue disorders: Secondary | ICD-10-CM

## 2015-09-21 DIAGNOSIS — M1A39X0 Chronic gout due to renal impairment, multiple sites, without tophus (tophi): Secondary | ICD-10-CM

## 2015-09-21 DIAGNOSIS — I10 Essential (primary) hypertension: Secondary | ICD-10-CM | POA: Diagnosis not present

## 2015-09-21 DIAGNOSIS — Z79899 Other long term (current) drug therapy: Secondary | ICD-10-CM

## 2015-09-21 DIAGNOSIS — D509 Iron deficiency anemia, unspecified: Secondary | ICD-10-CM

## 2015-09-21 DIAGNOSIS — M1030 Gout due to renal impairment, unspecified site: Secondary | ICD-10-CM | POA: Diagnosis not present

## 2015-09-21 DIAGNOSIS — R600 Localized edema: Secondary | ICD-10-CM

## 2015-09-21 MED ORDER — FUROSEMIDE 40 MG PO TABS: 40.0000 mg | ORAL_TABLET | Freq: Two times a day (BID) | ORAL | Status: DC

## 2015-09-21 NOTE — Patient Instructions (Signed)
1. Please make a follow up appointment for 1-2 weeks.   2. Please take all medications as previously prescribed with the following changes:  Increase Lasix to 40 mg TWICE daily.   We will schedule an ultrasound of the left arm for you. We will also place a referral to see the vascular surgeon as well for this.   3. If you have worsening of your symptoms or new symptoms arise, please call the clinic [phone]), or go to the ER immediately if symptoms are severe.  You have done a great job in taking all your medications. Please continue to do this.

## 2015-09-21 NOTE — Progress Notes (Signed)
   Subjective:   Patient ID: Luke Dunn male   DOB: 06/29/1950 65 y.o.   MRN: [id number]  HPI: Luke Dunn is a 65 y.o. [place] speaking male w/ PMHx of HTN, Gout, CKD stage 4, and anemia, presents today for follow up visit regarding his chronic kidney disease and Gout. Patient says he is doing quite well recently, except for the fact that he feels his left arm is swollen. Feels this has been present for about 1-2 months. Denies any significant pain, only swelling. No numbness or tingling in the hand or fingers. Patient received AVF in the LUE last year in 06/2014 given his worsening renal function. Patient also notes some swelling in his LE's bilaterally, as well as some DOE that is slightly worse recently. Takes Lasix 40 mg daily. No chest pain, dizziness, lightheadedness, or palpitations. No fever, chills, nausea, or vomiting. Sleeps comfortably at night.    Current Outpatient Prescriptions  Medication Sig Dispense Refill  . amLODipine (NORVASC) 10 MG tablet TAKE 1 TABLET BY MOUTH EVERY DAY 90 tablet 1  . diclofenac sodium (VOLTAREN) 1 % GEL Apply 2 g topically 4 (four) times daily. 100 g 1  . furosemide (LASIX) 40 MG tablet TAKE 1 TABLET BY MOUTH EVERY DAY 90 tablet 1  . hydrocerin (EUCERIN) CREA Apply 2 application topically 2 (two) times daily. 454 g 0  . Nutritional Supplements (FEEDING SUPPLEMENT, NEPRO CARB STEADY,) LIQD Take 237 mLs by mouth 3 (three) times daily with meals. 30 Can 3  . pantoprazole (PROTONIX) 40 MG tablet TAKE 1 TABLET BY MOUTH EVERY DAY 90 tablet 1  . sodium bicarbonate 650 MG tablet Take 2 tablets (1,300 mg total) by mouth 2 (two) times daily. 120 tablet 5  . ULORIC 40 MG tablet Take 2 tablets by mouth daily. 180 tablet 1   No current facility-administered medications for this visit.   Review of Systems  General: Denies fever, diaphoresis, appetite change, and fatigue.  Respiratory: Positive for DOE. Denies cough, and wheezing.   Cardiovascular: Positive for LE  swelling. Denies chest pain and palpitations.  Gastrointestinal: Denies nausea, vomiting, abdominal pain, and diarrhea Musculoskeletal: Positive for LUE swelling. Denies myalgias, arthralgias, back pain, and gait problem.  Neurological: Denies dizziness, syncope, weakness, lightheadedness, and headaches.  Psychiatric/Behavioral: Denies mood changes, sleep disturbance, and agitation.   Objective:   Physical Exam: Filed Vitals:   09/21/15 1525  BP: 143/77  Pulse: 101  Temp: 99 F (37.2 C)  TempSrc: Oral  Height: 5\' 3"  (1.6 m)  Weight: 210 lb 1.6 oz (95.301 kg)  SpO2: 97%     General: very pleasant [place] speaking male, alert, cooperative, NAD. HEENT: PERRL, EOMI. Moist mucus membranes. Poor dentition.  Neck: Full range of motion without pain, supple, no lymphadenopathy or carotid bruits. No apparent JVD.  Lungs: Clear to ascultation bilaterally, normal work of respiration, no wheezes, rales, rhonchi Heart: Mild tachycardia, no murmurs, gallops, or rubs Abdomen: Soft, non-tender, non-distended, BS +. Mild edema on lower abdominal pannus.  Extremities: No cyanosis, clubbing. LUE with diffuse swelling. Non-pitting. Pulse intact distally. No lesions in web spaces. Distal extremity warm to the touch. AVF with good thrill and bruit. LE's with +3 pitting edema extending to the knees bilaterally.  Neurologic: Alert & oriented x3, cranial nerves II-XII intact, strength grossly intact, sensation intact to light touch    Assessment & Plan:   Please see problem based assessment and plan.

## 2015-09-21 NOTE — Congregational Nurse Program (Signed)
Congregational Nurse Program Note  Date of Encounter: 09/21/2015  Past Medical History: Past Medical History  Diagnosis Date  . Gout   . CKD (chronic kidney disease) stage 4, GFR 15-29 ml/min (HCC)     Followed by [facility].  Thought to be due to long-standing HTN  . Essential hypertension   . Gout 05/04/2009    Qualifier: Diagnosis of  By: Dyann Kief MD, Clifton James    . Barrett's esophagus   . Edema   . GERD (gastroesophageal reflux disease)     Encounter Details:     CNP Questionnaire - 09/21/15 1903    Patient Demographics   Is this a new or existing patient? Existing   Patient is considered a/an Refugee   Race Asian   Patient Assistance   Location of Patient Assistance Not Applicable   Patient's financial/insurance status Low Income;Medicaid;Medicare   Uninsured Patient No   Patient referred to apply for the following financial assistance Not Applicable   Food insecurities addressed Not Applicable   Transportation assistance No   Assistance securing medications No   Educational health offerings Not Applicable   Encounter Details   Primary purpose of visit Chronic Illness/Condition Visit;Other;Navigating the Healthcare System   Was an Emergency Department visit averted? Not Applicable   Does patient have a medical provider? Yes   Patient referred to Not Applicable   Was a mental health screening completed? (GAINS tool) No   Does patient have dental issues? No   Was a dental referral made? No   Does patient have vision issues? No   Since previous encounter, have you referred patient for abnormal blood pressure that resulted in a new diagnosis or medication change? No   Since previous encounter, have you referred patient for abnormal blood glucose that resulted in a new diagnosis or medication change? No   For Abstraction Use Only   Does patient have insurance? Yes         Amb Nursing Assessment - 09/21/15 1528    Nutrition Screen   BMI - recorded 37.22   Nutritional Status BMI > 30  Obese   Nutritional Risks None   Diabetes No   Functional Status   Activities of Daily Living Independent   Ambulation Independent   Medication Administration Independent   Home Management Independent   Risk/Barriers  Assessment   Barriers to Care Management & Learning Language  Jarah-Interpreter   Abuse/Neglect Assessment   Do you feel unsafe in your current relationship? No   Do you feel physically threatened by others? No   Anyone hurting you at home, work, or school? No   Unable to ask? No   Information provided on Community resources No   Patient Literacy   How often do you need to have someone help you when you read instructions, pamphlets, or other written materials from your doctor or pharmacy? 1 - Never   What is the last grade level you completed in school? no school   Programmer, multimedia   Interpreter Needed? Yes   Interpreter Agency [employer]   Interpreter Name Modena Nunnery   Patient Declined Interpreter  No   Patient signed [facility] waiver No   Comments   Information entered by : Sander Nephew, RN 09/21/2015 3:30 PM      Home visit to fill pill box for 2 weeks.  Accompanied patient to follow-up appointment with Dr. Ronnald Ramp at [facility] and to [facility] [address]for an ultrasound of the left forearm and  hand due to swelling x2 weeks.[name] RN, Congregational Nurse [phone].

## 2015-09-22 LAB — BMP8+ANION GAP
Anion Gap: 18 mmol/L (ref 10.0–18.0)
BUN / CREAT RATIO: 10 (ref 10–24)
BUN: 38 mg/dL — AB (ref 8–27)
CO2: 25 mmol/L (ref 18–29)
CREATININE: 3.72 mg/dL — AB (ref 0.76–1.27)
Calcium: 8.7 mg/dL (ref 8.6–10.2)
Chloride: 102 mmol/L (ref 96–106)
GFR calc Af Amer: 18 mL/min/{1.73_m2} — ABNORMAL LOW (ref >59)
GFR calc non Af Amer: 16 mL/min/{1.73_m2} — ABNORMAL LOW (ref >59)
Glucose: 94 mg/dL (ref 65–99)
Potassium: 4.3 mmol/L (ref 3.5–5.2)
SODIUM: 145 mmol/L — AB (ref 134–144)

## 2015-09-22 LAB — CBC
HEMATOCRIT: 35.4 % — AB (ref 37.5–51.0)
Hemoglobin: 11.3 g/dL — ABNORMAL LOW (ref 12.6–17.7)
MCH: 25.5 pg — AB (ref 26.6–33.0)
MCHC: 31.9 g/dL (ref 31.5–35.7)
MCV: 80 fL (ref 79–97)
PLATELETS: 181 10*3/uL (ref 150–379)
RBC: 4.44 x10E6/uL (ref 4.14–5.80)
RDW: 15.1 % (ref 12.3–15.4)
WBC: 5.7 10*3/uL (ref 3.4–10.8)

## 2015-09-22 LAB — FERRITIN: Ferritin: 158 ng/mL (ref 30–400)

## 2015-09-22 NOTE — Assessment & Plan Note (Signed)
Controlled on Ulor[employer]. Explained importance of compliance with this given increase in diuretic.

## 2015-09-22 NOTE — Assessment & Plan Note (Signed)
Repeat CBC and ferritin suggests anemia of chronic disease, also likely related to renal dysfunction.  -No intervention at this time -Continue to monitor

## 2015-09-22 NOTE — Assessment & Plan Note (Signed)
Says this has been present for 1-2 months. Non-pitting. No pain. AVF placed last year. On exam, fistula with good thrill and bruit. Distal pulses intact. No paresthesias. Distal extremity warm. Venous doppler performed, no DVT in LUE.  -Referral to [facility] given possible association with AVF. -RTC in 1-2 weeks

## 2015-09-22 NOTE — Assessment & Plan Note (Signed)
BP Readings from Last 3 Encounters:  09/21/15 143/77  08/25/15 140/84  07/28/15 150/82    Lab Results  Component Value Date   NA 145* 09/21/2015   K 4.3 09/21/2015   CREATININE 3.72* 09/21/2015    Assessment: Blood pressure control:  Mild elevation Comments: Patient takes Lasix 40 mg daily + Norvasc 10 mg daily  Plan: Medications:  Increase Lasix to 40 mg bid. Continue Norvasc. CCB can potentially be contributing to LE edema, however, wonder if this is purely related to worsening renal function. Cr now 3.72.  Other plans: RTC in 1-2 weeks for follow up. REPEAT BMP AT THAT TIME given increase in Lasix dosing.

## 2015-09-22 NOTE — Assessment & Plan Note (Signed)
Patient with slowly worsening renal function. Cr 3.72 now. Electrolytes stable. HCO3 wnl. Taking bicarb 1300 mg bid, lasix 40 mg daily. Worsening LE edema and DOE. Lungs clear on exam.  -Increase Lasix to 40 mg bid -Check weight at next clinic visit -RTC in 1-2 weeks -Check BMP at that time -Low threshold to check ECHO if patient not diuresing well, symptomatic, increased weight, etc.

## 2015-09-22 NOTE — Progress Notes (Signed)
Internal Medicine Clinic Attending  Case discussed with Dr. Jones at the time of the visit.  We reviewed the resident's history and exam and pertinent patient test results.  I agree with the assessment, diagnosis, and plan of care documented in the resident's note.  

## 2015-09-22 NOTE — Assessment & Plan Note (Signed)
Think this is mostly related to worsening renal function. Weight has also increased. +3 pitting edema extending to the knees bilaterally. Lungs clear.  -Increase Lasix to 40 mg bid. May need higher dose like 80 mg bid if no improvement with 40 mg bid -May need ECHO  -RTC in 1-2 weeks

## 2015-09-23 ENCOUNTER — Encounter: Payer: Self-pay | Admitting: Surgery

## 2015-09-27 ENCOUNTER — Encounter: Payer: Self-pay | Admitting: Surgery

## 2015-09-27 ENCOUNTER — Ambulatory Visit (INDEPENDENT_AMBULATORY_CARE_PROVIDER_SITE_OTHER): Payer: Medicare Other | Admitting: Surgery

## 2015-09-27 VITALS — BP 118/79 | HR 87 | Ht 63.0 in | Wt 204.3 lb

## 2015-09-27 DIAGNOSIS — N184 Chronic kidney disease, stage 4 (severe): Secondary | ICD-10-CM | POA: Diagnosis not present

## 2015-09-27 DIAGNOSIS — Z719 Counseling, unspecified: Secondary | ICD-10-CM

## 2015-09-27 NOTE — Progress Notes (Signed)
Vascular and Vein Specialist of [facility]  Patient name: Luke Dunn MRN: [id number] DOB: 1951/01/30 Sex: male  REASON FOR VISIT: left arm swelling  HPI: This is the patient's first postoperative follow-up. On 07/10/2014, he underwent a left brachiocephalic fistula. He is not yet on dialysis. Recently he had an episode of swelling in his bilateral lower extremity as well as his left arm near the wrist.  He had a left upper extremity ultrasound which was negative for DVT.  After increasing his diuretic, the swelling has resolved.  He did not have any swelling in his right arm  Past Medical History  Diagnosis Date  . Gout   . CKD (chronic kidney disease) stage 4, GFR 15-29 ml/min (HCC)     Followed by [facility].  Thought to be due to long-standing HTN  . Essential hypertension   . Gout 05/04/2009    Qualifier: Diagnosis of  By: Dyann Kief MD, Clifton James    . Barrett's esophagus   . Edema   . GERD (gastroesophageal reflux disease)     No family history on file.  SOCIAL HISTORY: Social History  Substance Use Topics  . Smoking status: Former Smoker -- 1.00 packs/day for 37 years    Quit date: 02/01/2007  . Smokeless tobacco: Former Systems developer    Quit date: 02/01/2007  . Alcohol Use: No    No Known Allergies  Current Outpatient Prescriptions  Medication Sig Dispense Refill  . amLODipine (NORVASC) 10 MG tablet TAKE 1 TABLET BY MOUTH EVERY DAY 90 tablet 1  . diclofenac sodium (VOLTAREN) 1 % GEL Apply 2 g topically 4 (four) times daily. 100 g 1  . furosemide (LASIX) 40 MG tablet Take 1 tablet (40 mg total) by mouth 2 (two) times daily. 180 tablet 1  . pantoprazole (PROTONIX) 40 MG tablet TAKE 1 TABLET BY MOUTH EVERY DAY 90 tablet 1  . sodium bicarbonate 650 MG tablet Take 2 tablets (1,300 mg total) by mouth 2 (two) times daily. 120 tablet 5  . ULORIC 40 MG tablet Take 2 tablets by mouth daily. 180 tablet 1  . hydrocerin (EUCERIN) CREA Apply 2 application topically 2 (two) times daily.  (Patient not taking: Reported on 09/27/2015) 454 g 0  . Nutritional Supplements (FEEDING SUPPLEMENT, NEPRO CARB STEADY,) LIQD Take 237 mLs by mouth 3 (three) times daily with meals. (Patient not taking: Reported on 09/27/2015) 30 Can 3   No current facility-administered medications for this visit.    REVIEW OF SYSTEMS:  [X]  denotes positive finding, [ ]  denotes negative finding Cardiac  Comments:  Chest pain or chest pressure:    Shortness of breath upon exertion:    Short of breath when lying flat:    Irregular heart rhythm:        Vascular    Pain in calf, thigh, or hip brought on by ambulation:    Pain in feet at night that wakes you up from your sleep:     Blood clot in your veins:    Leg swelling:  Arm swelling       Pulmonary    Oxygen at home:    Productive cough:     Wheezing:         Neurologic    Sudden weakness in arms or legs:     Sudden numbness in arms or legs:     Sudden onset of difficulty speaking or slurred speech:    Temporary loss of vision in one eye:     Problems with  dizziness:         Gastrointestinal    Blood in stool:     Vomited blood:         Genitourinary    Burning when urinating:     Blood in urine:        Psychiatric    Major depression:         Hematologic    Bleeding problems:    Problems with blood clotting too easily:        Skin    Rashes or ulcers:        Constitutional    Fever or chills:      PHYSICAL EXAM: Filed Vitals:   09/27/15 1543  BP: 118/79  Pulse: 87  Height: 5\' 3"  (1.6 m)  Weight: 204 lb 4.8 oz (92.67 kg)  SpO2: 98%    GENERAL: The patient is a well-nourished male, in no acute distress. The vital signs are documented above. CARDIAC: There is a regular rate and rhythm.  VASCULAR: Excellent thrill within left upper arm AV fistula.  No significant left arm swelling  PULMONARY: There is good air exchange bilaterally without wheezing or rales. MUSCULOSKELETAL: There are no major deformities or  cyanosis. NEUROLOGIC: No focal weakness or paresthesias are detected. SKIN: There are no ulcers or rashes noted.  DATA:  I have reviewed his ultrasound from 09/22/2015.  This shows no evidence of deep or superficial venous thrombus in the left arm   MEDICAL ISSUES: SWELLING: Currently the patient's symptoms have resolved.  It is not clear as to the exact etiology of his arm swelling.  I doubt that it is secondary to his fistula as it was isolated to an area down near the wrist.  I have told the patient that if he develops new or worsening symptoms to contact me.   Annamarie Major Vascular and Vein Specialists of [employer]: [phone]

## 2015-09-27 NOTE — Congregational Nurse Program (Signed)
Congregational Nurse Program Note  Date of Encounter: 09/27/2015  Past Medical History: Past Medical History  Diagnosis Date  . Gout   . CKD (chronic kidney disease) stage 4, GFR 15-29 ml/min (HCC)     Followed by [facility].  Thought to be due to long-standing HTN  . Essential hypertension   . Gout 05/04/2009    Qualifier: Diagnosis of  By: Dyann Kief MD, Clifton James    . Barrett's esophagus   . Edema   . GERD (gastroesophageal reflux disease)     Encounter Details:     CNP Questionnaire - 09/27/15 1953    Patient Demographics   Is this a new or existing patient? Existing   Patient is considered a/an Refugee   Race Asian   Patient Assistance   Location of Patient Assistance Not Applicable   Patient's financial/insurance status Low Income;Medicaid;Medicare   Uninsured Patient No   Patient referred to apply for the following financial assistance Not Applicable   Food insecurities addressed Not Applicable   Transportation assistance No   Assistance securing medications No   Educational health offerings Nutrition;Exercise/physical activity   Encounter Details   Primary purpose of visit Chronic Illness/Condition Visit;Other;Navigating the Healthcare System   Was an Emergency Department visit averted? Not Applicable   Does patient have a medical provider? Yes   Patient referred to Not Applicable   Was a mental health screening completed? (GAINS tool) No   Does patient have dental issues? No   Was a dental referral made? No   Does patient have vision issues? No   Does your patient have an abnormal blood pressure today? No   Since previous encounter, have you referred patient for abnormal blood pressure that resulted in a new diagnosis or medication change? No   Does your patient have an abnormal blood glucose today? No   Since previous encounter, have you referred patient for abnormal blood glucose that resulted in a new diagnosis or medication change? No   Was there a  life-saving intervention made? No   For Abstraction Use Only   Was the patient insured? Yes         Amb Nursing Assessment - 09/21/15 1528    Nutrition Screen   BMI - recorded 37.22   Nutritional Status BMI > 30  Obese   Nutritional Risks None   Diabetes No   Functional Status   Activities of Daily Living Independent   Ambulation Independent   Medication Administration Independent   Home Management Independent   Risk/Barriers  Assessment   Barriers to Care Management & Learning Language  Jarah-Interpreter   Abuse/Neglect Assessment   Do you feel unsafe in your current relationship? No   Do you feel physically threatened by others? No   Anyone hurting you at home, work, or school? No   Unable to ask? No   Information provided on Community resources No   Patient Literacy   How often do you need to have someone help you when you read instructions, pamphlets, or other written materials from your doctor or pharmacy? 1 - Never   What is the last grade level you completed in school? no school   Programmer, multimedia   Interpreter Needed? Yes   Interpreter Agency [employer]   Interpreter Name Modena Nunnery   Patient Declined Interpreter  No   Patient signed [facility] waiver No   Comments   Information entered by : Sander Nephew, RN 09/21/2015 3:30 PM      Accompanied patient to  [facility] for appointment with Dr. Trula Slade. He stated he does not believe problem with edema in left arm is related to fistula.  [name] RN, Congregational Nurse.  [phone].

## 2015-09-28 ENCOUNTER — Other Ambulatory Visit: Payer: Self-pay | Admitting: Internal Medicine

## 2015-10-06 ENCOUNTER — Encounter: Payer: Self-pay | Admitting: Internal Medicine

## 2015-10-06 ENCOUNTER — Ambulatory Visit (INDEPENDENT_AMBULATORY_CARE_PROVIDER_SITE_OTHER): Payer: Medicare Other | Admitting: Internal Medicine

## 2015-10-06 VITALS — BP 135/72 | HR 91 | Temp 98.5°F | Ht 63.0 in | Wt 208.7 lb

## 2015-10-06 DIAGNOSIS — M7989 Other specified soft tissue disorders: Secondary | ICD-10-CM

## 2015-10-06 DIAGNOSIS — Z719 Counseling, unspecified: Secondary | ICD-10-CM

## 2015-10-06 DIAGNOSIS — I129 Hypertensive chronic kidney disease with stage 1 through stage 4 chronic kidney disease, or unspecified chronic kidney disease: Secondary | ICD-10-CM

## 2015-10-06 DIAGNOSIS — N184 Chronic kidney disease, stage 4 (severe): Secondary | ICD-10-CM | POA: Diagnosis not present

## 2015-10-06 DIAGNOSIS — R600 Localized edema: Secondary | ICD-10-CM

## 2015-10-06 DIAGNOSIS — I10 Essential (primary) hypertension: Secondary | ICD-10-CM | POA: Diagnosis not present

## 2015-10-06 DIAGNOSIS — Z79899 Other long term (current) drug therapy: Secondary | ICD-10-CM

## 2015-10-06 MED ORDER — FUROSEMIDE 40 MG PO TABS: 40.0000 mg | ORAL_TABLET | Freq: Two times a day (BID) | ORAL | Status: DC

## 2015-10-06 NOTE — Progress Notes (Signed)
   Subjective:   Patient ID: Luke Dunn male   DOB: Dec 22, 1950 65 y.o.   MRN: [id number]  HPI: [name] is a 65 y.o. [place] speaking male w/ PMHx of HTN, Gout, CKD stage 4, and anemia, presents today for follow up visit regarding his recent LE swelling and LUE swelling. Given the fact that he had a LUE AVF placed within the past year, there was some concern for AVF dysfunction vs clot. UE doppler was negative for DVT and patient was seen by [facility], by that time his LUE had almost resolved. Today, patient says he feels great, no complaints. LE swelling has completely resolved after his dose of Lasix was increased to 40 mg bid. No further complaints.    Current Outpatient Prescriptions  Medication Sig Dispense Refill  . amLODipine (NORVASC) 10 MG tablet TAKE 1 TABLET BY MOUTH EVERY DAY 90 tablet 1  . diclofenac sodium (VOLTAREN) 1 % GEL Apply 2 g topically 4 (four) times daily. 100 g 1  . furosemide (LASIX) 40 MG tablet Take 1 tablet (40 mg total) by mouth 2 (two) times daily. 180 tablet 1  . hydrocerin (EUCERIN) CREA Apply 2 application topically 2 (two) times daily. (Patient not taking: Reported on 09/27/2015) 454 g 0  . Nutritional Supplements (FEEDING SUPPLEMENT, NEPRO CARB STEADY,) LIQD Take 237 mLs by mouth 3 (three) times daily with meals. (Patient not taking: Reported on 09/27/2015) 30 Can 3  . pantoprazole (PROTONIX) 40 MG tablet TAKE 1 TABLET BY MOUTH EVERY DAY 90 tablet 1  . sodium bicarbonate 650 MG tablet Take 2 tablets (1,300 mg total) by mouth 2 (two) times daily. 120 tablet 5  . ULORIC 40 MG tablet Take 2 tablets by mouth daily. 180 tablet 1   No current facility-administered medications for this visit.   Review of Systems  General: Denies fever, diaphoresis, appetite change, and fatigue.  Respiratory: Denies SOB, cough, and wheezing.   Cardiovascular: Denies chest pain and palpitations.  Gastrointestinal: Denies nausea, vomiting, abdominal pain, and diarrhea Musculoskeletal:  Denies myalgias, arthralgias, back pain, and gait problem.  Neurological: Denies dizziness, syncope, weakness, lightheadedness, and headaches.  Psychiatric/Behavioral: Denies mood changes, sleep disturbance, and agitation.   Objective:   Physical Exam: Filed Vitals:   10/06/15 1430  BP: 135/72  Pulse: 91  Temp: 98.5 F (36.9 C)  TempSrc: Oral  Height: 5\' 3"  (1.6 m)  Weight: 208 lb 11.2 oz (94.666 kg)  SpO2: 98%     General: very pleasant [place] speaking male, alert, cooperative, NAD. HEENT: PERRL, EOMI. Moist mucus membranes. Poor dentition.  Neck: Full range of motion without pain, supple, no lymphadenopathy or carotid bruits. No apparent JVD.  Lungs: Clear to ascultation bilaterally, normal work of respiration, no wheezes, rales, rhonchi Heart: Mild tachycardia, no murmurs, gallops, or rubs Abdomen: Soft, non-tender, non-distended, BS +.  Extremities: No cyanosis, clubbing or edema.  Neurologic: Alert & oriented x3, cranial nerves II-XII intact, strength grossly intact, sensation intact to light touch    Assessment & Plan:   Please see problem based assessment and plan.

## 2015-10-06 NOTE — Patient Instructions (Signed)
1. Please schedule a follow up appointment for 3 months.   2. Please take all medications as previously prescribed with the following changes:  Continue Lasix 40 mg TWICE DAILY.   Try to eat food low in sodium.   3. If you have worsening of your symptoms or new symptoms arise, please call the clinic [phone]), or go to the ER immediately if symptoms are severe.  You have done a great job in taking all your medications. Please continue to do this.   Food Basics for Chronic Kidney Disease When your kidneys are not working well, they cannot remove waste and excess substances from your blood as effectively as they did before. This can lead to a buildup and imbalance of these substances, which can affect how your body functions. This buildup can also make your kidneys work harder, causing even more damage. You may need to eat less of certain foods that can lead to the buildup of these substances in your body. By making the changes to your diet that are recommended by your dietitian or health care provider, you could possibly help prevent further kidney damage and delay or prevent the need for dialysis. The following information can help give you a basic understanding of these substances and how they affect your bodily functions. The information also gives examples of foods that contain the highest amounts of these substances. WHAT DO I NEED TO KNOW ABOUT SUBSTANCES IN MY FOOD THAT I MAY NEED TO ADJUST? Food adjustments will be different for each person with chronic kidney disease. It is important that you see a dietitian who can help you determine the specific adjustments that you will need to make for each of the following substances: Potassium Potassium affects how steadily your heart beats. If too much potassium builds up in your blood, it can cause an irregular heartbeat or even a heart attack. Examples of foods rich in potassium  include:  Milk.  Fruits.  Vegetables. Phosphorus Phosphorus is a mineral found in your bones. A balance between calcium and phosphorous is needed to build and maintain healthy bones. Too much phosphorus pulls calcium from your bones. This can make your bones weak and more likely to break. Too much phosphorus can also make your skin itch. Examples of foods rich in phosphorus include:  Milk and cheese.  Dried beans.  Peas.  Colas.  Nuts and peanut butter. Animal Protein Animal protein helps you make and keep muscle. It also helps in the repair of your body's cells and tissues. One of the natural breakdown products of protein is a waste product called urea. When your kidneys are not working properly, they cannot remove wastes such as urea like they did before you developed chronic kidney disease. You will likely need to limit the amount of protein you eat to help prevent a buildup of urea in your blood. Examples of animal protein include:  Meat (all types).  Fish and seafood.  Poultry.  Eggs. Sodium Sodium, which is found in salt, helps maintain a healthy balance of fluids in your body. Too much sodium can increase your blood pressure level and have a negative affect on the function of your heart and lungs. Too much sodium also can cause your body to retain too much fluid, making your kidneys work harder. Examples of foods with high levels of sodium include:  Salt seasonings.  Soy sauce.  Cured and processed meats.  Salted crackers and snack foods.  Fast food.  Canned soups and most canned  foods. Glucose Glucose provides energy for your body. If you have diabetes mellitus that is not properly controlled, you have too much glucose in your blood. Too much glucose in your blood can worsen the function of your kidneys by damaging small blood vessels. This prevents enough blood flow to your kidneys to give them what they need to work. If you have diabetes mellitus and chronic  kidney disease, it is important to maintain your blood glucose at a level recommended by your health care provider. SHOULD I TAKE A VITAMIN AND MINERAL SUPPLEMENT? Because you may need to avoid eating certain foods, you may not get all of the vitamins and minerals that would normally come from those foods. Your health care provider or dietitian may recommend that you take a supplement to ensure that you get all of the vitamins and minerals that your body needs.    This information is not intended to replace advice given to you by your health care provider. Make sure you discuss any questions you have with your health care provider.   Document Released: 07/29/2002 Document Revised: 05/29/2014 Document Reviewed: 04/04/2013 Elsevier Interactive Patient Education [employer].

## 2015-10-06 NOTE — Congregational Nurse Program (Signed)
Congregational Nurse Program Note  Date of Encounter: 10/06/2015  Past Medical History: Past Medical History  Diagnosis Date  . Gout   . CKD (chronic kidney disease) stage 4, GFR 15-29 ml/min (HCC)     Followed by [facility].  Thought to be due to long-standing HTN  . Essential hypertension   . Gout 05/04/2009    Qualifier: Diagnosis of  By: Dyann Kief MD, Clifton James    . Barrett's esophagus   . Edema   . GERD (gastroesophageal reflux disease)     Encounter Details:     CNP Questionnaire - 10/06/15 2310    Patient Demographics   Is this a new or existing patient? Existing   Patient is considered a/an Refugee   Race Asian   Patient Assistance   Location of Patient Assistance Not Applicable   Patient's financial/insurance status Low Income;Medicaid;Medicare   Uninsured Patient No   Patient referred to apply for the following financial assistance Not Applicable   Food insecurities addressed Not Applicable   Transportation assistance No   Assistance securing medications No   Educational health offerings Navigating the healthcare system;Chronic disease   Encounter Details   Primary purpose of visit Chronic Illness/Condition Visit;Navigating the Healthcare System   Was an Emergency Department visit averted? Not Applicable   Does patient have a medical provider? Yes   Patient referred to Not Applicable   Was a mental health screening completed? (GAINS tool) No   Does patient have dental issues? No   Was a dental referral made? No   Does patient have vision issues? No   Does your patient have an abnormal blood pressure today? No   Since previous encounter, have you referred patient for abnormal blood pressure that resulted in a new diagnosis or medication change? No   Does your patient have an abnormal blood glucose today? No   Since previous encounter, have you referred patient for abnormal blood glucose that resulted in a new diagnosis or medication change? No   Was there a  life-saving intervention made? No   For Abstraction Use Only   Was the patient insured? Yes         Amb Nursing Assessment - 10/06/15 1432    Pre-visit preparation   Pre-visit preparation completed Yes   Pain Assessment   Pain Assessment No/denies pain   Nutrition Screen   BMI - recorded 36.97   Nutritional Status BMI > 30  Obese   Nutritional Risks None   Diabetes No   Functional Status   Activities of Daily Living Independent   Ambulation Independent   Medication Administration Independent   Home Management Independent   Risk/Barriers  Assessment   Barriers to Care Management & Learning Language  Mike Gip -Interpreter   Abuse/Neglect Assessment   Do you feel unsafe in your current relationship? No   Do you feel physically threatened by others? No   Anyone hurting you at home, work, or school? No   Unable to ask? No   Information provided on Community resources No   Patient Literacy   How often do you need to have someone help you when you read instructions, pamphlets, or other written materials from your doctor or pharmacy? 5 - Always   What is the last grade level you completed in school? no school    Programmer, multimedia   Interpreter Needed? Yes   Interpreter Agency [employer]   Interpreter Name Modena Nunnery   Patient Declined Interpreter  No   Patient signed [facility]  waiver No   Comments   Information entered by : Sander Nephew, RN 10/06/2015 2:34 PM       Accompanied patient to follow-up appointment with Dr. Ronnald Ramp for left upper extremity swelling. Condition improved.  Labwork ordered for kidney function and if indicated he will continue on increased dose of Furosemide. Filled pill boxes for 2 weeks. [name] RN, Congregational Nurse  [phone]

## 2015-10-07 LAB — BMP8+ANION GAP
Anion Gap: 16 mmol/L (ref 10.0–18.0)
BUN / CREAT RATIO: 13 (ref 10–24)
BUN: 54 mg/dL — ABNORMAL HIGH (ref 8–27)
CO2: 27 mmol/L (ref 18–29)
CREATININE: 4.2 mg/dL — AB (ref 0.76–1.27)
Calcium: 8.8 mg/dL (ref 8.6–10.2)
Chloride: 99 mmol/L (ref 96–106)
GFR, EST AFRICAN AMERICAN: 16 mL/min/{1.73_m2} — AB (ref >59)
GFR, EST NON AFRICAN AMERICAN: 14 mL/min/{1.73_m2} — AB (ref >59)
Glucose: 109 mg/dL — ABNORMAL HIGH (ref 65–99)
Potassium: 4.5 mmol/L (ref 3.5–5.2)
Sodium: 142 mmol/L (ref 134–144)

## 2015-10-07 NOTE — Assessment & Plan Note (Signed)
Resolved. Unclear etiology, may be related to mild hypervolemia 2/2 his renal disease. LUE doppler negative. Seen by vascular, no acute issues at that time.

## 2015-10-07 NOTE — Assessment & Plan Note (Signed)
Volume status improved with Lasix 40 mg bid. Cr has increased to 4.20, however, GFR only mildly affected. Patient is scheduled to follow up with Dr. Justin Mend in 11/2015.  -Continue Lasix 40 mg bid -Continue NaHCO3 1300 mg bid

## 2015-10-07 NOTE — Assessment & Plan Note (Signed)
Resolved.  -Continue Lasix 40 mg bid

## 2015-10-07 NOTE — Assessment & Plan Note (Signed)
BP Readings from Last 3 Encounters:  10/06/15 135/72  09/27/15 118/79  09/21/15 143/77    Lab Results  Component Value Date   NA 142 10/06/2015   K 4.5 10/06/2015   CREATININE 4.20* 10/06/2015    Assessment: Blood pressure control:  Well controlled  Progress toward BP goal:   At goal Comments: Taking Norvasc 10 mg daily + Lasix 40 mg bid  Plan: Medications:  continue current medications Educational resources provided: brochure (denies need ) Self management tools provided:   Other plans: RTC in 3 months. Check BMP at that time.

## 2015-10-11 NOTE — Progress Notes (Signed)
Internal Medicine Clinic Attending  Case discussed with Dr. Jones at the time of the visit.  We reviewed the resident's history and exam and pertinent patient test results.  I agree with the assessment, diagnosis, and plan of care documented in the resident's note.  

## 2015-10-13 DIAGNOSIS — Z719 Counseling, unspecified: Secondary | ICD-10-CM

## 2015-10-13 DIAGNOSIS — I10 Essential (primary) hypertension: Secondary | ICD-10-CM

## 2015-10-13 DIAGNOSIS — Z79899 Other long term (current) drug therapy: Secondary | ICD-10-CM

## 2015-10-13 NOTE — Congregational Nurse Program (Signed)
Congregational Nurse Program Note  Date of Encounter: 10/13/2015  Past Medical History: Past Medical History  Diagnosis Date  . Gout   . CKD (chronic kidney disease) stage 4, GFR 15-29 ml/min (HCC)     Followed by [facility].  Thought to be due to long-standing HTN  . Essential hypertension   . Gout 05/04/2009    Qualifier: Diagnosis of  By: Dyann Kief MD, Clifton James    . Barrett's esophagus   . Edema   . GERD (gastroesophageal reflux disease)     Encounter Details:     CNP Questionnaire - 10/13/15 1855    Patient Demographics   Is this a new or existing patient? Existing   Patient is considered a/an Refugee   Race Asian   Patient Assistance   Location of Patient Assistance Not Applicable   Patient's financial/insurance status Low Income;Medicaid;Medicare   Uninsured Patient No   Patient referred to apply for the following financial assistance Not Applicable   Food insecurities addressed Not Applicable   Transportation assistance No   Assistance securing medications Yes   Type of Assistance [facility]   Educational health offerings Navigating the healthcare system   Encounter Details   Primary purpose of visit Chronic Illness/Condition Visit;Navigating the Healthcare System   Was an Emergency Department visit averted? Not Applicable   Does patient have a medical provider? Yes   Patient referred to Not Applicable   Was a mental health screening completed? (GAINS tool) No   Does patient have dental issues? No   Was a dental referral made? No   Does patient have vision issues? No   Does your patient have an abnormal blood pressure today? No   Since previous encounter, have you referred patient for abnormal blood pressure that resulted in a new diagnosis or medication change? No   Does your patient have an abnormal blood glucose today? No   Since previous encounter, have you referred patient for abnormal blood glucose that resulted in a new diagnosis or medication  change? No   Was there a life-saving intervention made? No   For Abstraction Use Only   Was the patient insured? Yes         Amb Nursing Assessment - 10/06/15 1432    Pre-visit preparation   Pre-visit preparation completed Yes   Pain Assessment   Pain Assessment No/denies pain   Nutrition Screen   BMI - recorded 36.97   Nutritional Status BMI > 30  Obese   Nutritional Risks None   Diabetes No   Functional Status   Activities of Daily Living Independent   Ambulation Independent   Medication Administration Independent   Home Management Independent   Risk/Barriers  Assessment   Barriers to Care Management & Learning Language  Mike Gip -Interpreter   Abuse/Neglect Assessment   Do you feel unsafe in your current relationship? No   Do you feel physically threatened by others? No   Anyone hurting you at home, work, or school? No   Unable to ask? No   Information provided on Community resources No   Patient Literacy   How often do you need to have someone help you when you read instructions, pamphlets, or other written materials from your doctor or pharmacy? 5 - Always   What is the last grade level you completed in school? no school    Programmer, multimedia   Interpreter Needed? Yes   Interpreter Agency [employer]   Interpreter Name Modena Nunnery   Patient Declined Interpreter  No  Patient signed [facility] waiver No   Comments   Information entered by : Sander Nephew, RN 10/06/2015 2:34 PM    Home visit.  Reviewed medications.  Called in refill for Furosemide. Pt. Now taking 40 mg b.i.d. BP 140/80.  Assisted in understanding EOB from insurance.  Will visit in 1 week to complete filling of medication boxes.  [name] RN, Congregational Nurse [phone]

## 2015-11-01 ENCOUNTER — Encounter: Payer: Self-pay | Admitting: *Deleted

## 2015-11-03 DIAGNOSIS — Z79899 Other long term (current) drug therapy: Secondary | ICD-10-CM

## 2015-11-03 DIAGNOSIS — I10 Essential (primary) hypertension: Secondary | ICD-10-CM

## 2015-11-03 NOTE — Congregational Nurse Program (Signed)
Congregational Nurse Program Note  Date of Encounter: 11/03/2015  Past Medical History: Past Medical History  Diagnosis Date  . Gout   . CKD (chronic kidney disease) stage 4, GFR 15-29 ml/min (HCC)     Followed by [facility].  Thought to be due to long-standing HTN  . Essential hypertension   . Gout 05/04/2009    Qualifier: Diagnosis of  By: Dyann Kief MD, Clifton James    . Barrett's esophagus   . Edema   . GERD (gastroesophageal reflux disease)     Encounter Details:     CNP Questionnaire - 11/03/15 1544    Patient Demographics   Is this a new or existing patient? Existing   Patient is considered a/an Refugee   Race Asian   Patient Assistance   Location of Patient Assistance Not Applicable   Patient's financial/insurance status Low Income;Medicaid;Medicare   Uninsured Patient No   Patient referred to apply for the following financial assistance Not Applicable   Food insecurities addressed Not Applicable   Transportation assistance No   Assistance securing medications No   Encounter Details   Primary purpose of visit Chronic Illness/Condition Visit   Was an Emergency Department visit averted? Not Applicable   Does patient have a medical provider? Yes   Patient referred to Not Applicable   Was a mental health screening completed? (GAINS tool) No   Does patient have dental issues? No   Was a dental referral made? No   Does patient have vision issues? No   Does your patient have an abnormal blood pressure today? No   Since previous encounter, have you referred patient for abnormal blood pressure that resulted in a new diagnosis or medication change? No   Does your patient have an abnormal blood glucose today? No   Since previous encounter, have you referred patient for abnormal blood glucose that resulted in a new diagnosis or medication change? No   Was there a life-saving intervention made? No   For Abstraction Use Only   Was the patient insured? Yes       Home visit  with interpreter [name].  BP 138/80.  Filled pill boxes for 2 weeks.  Patient states he is doing well.  No complaints.  [name] RN, Congregational Nurse [phone]

## 2015-11-08 ENCOUNTER — Other Ambulatory Visit: Payer: Self-pay | Admitting: Internal Medicine

## 2015-11-17 DIAGNOSIS — Z79899 Other long term (current) drug therapy: Secondary | ICD-10-CM

## 2015-11-17 DIAGNOSIS — I10 Essential (primary) hypertension: Secondary | ICD-10-CM

## 2015-11-17 NOTE — Congregational Nurse Program (Signed)
Congregational Nurse Program Note  Date of Encounter: 11/17/2015  Past Medical History: Past Medical History  Diagnosis Date  . Gout   . CKD (chronic kidney disease) stage 4, GFR 15-29 ml/min (HCC)     Followed by [facility].  Thought to be due to long-standing HTN  . Essential hypertension   . Gout 05/04/2009    Qualifier: Diagnosis of  By: Dyann Kief MD, Clifton James    . Barrett's esophagus   . Edema   . GERD (gastroesophageal reflux disease)     Encounter Details:     CNP Questionnaire - 11/17/15 1953    Patient Demographics   Is this a new or existing patient? Existing   Patient is considered a/an Refugee   Race Asian   Patient Assistance   Location of Patient Assistance Not Applicable   Patient's financial/insurance status Low Income;Medicaid;Medicare   Uninsured Patient No   Patient referred to apply for the following financial assistance Not Applicable   Food insecurities addressed Not Applicable   Transportation assistance No   Assistance securing medications No   Type of Assistance [facility]   Educational health offerings Not Applicable   Encounter Details   Primary purpose of visit Chronic Illness/Condition Visit   Was an Emergency Department visit averted? Not Applicable   Does patient have a medical provider? Yes   Patient referred to Not Applicable   Was a mental health screening completed? (GAINS tool) No   Does patient have dental issues? No   Was a dental referral made? No   Does patient have vision issues? No   Does your patient have an abnormal blood pressure today? No   Since previous encounter, have you referred patient for abnormal blood pressure that resulted in a new diagnosis or medication change? No   Does your patient have an abnormal blood glucose today? No   Since previous encounter, have you referred patient for abnormal blood glucose that resulted in a new diagnosis or medication change? No   Was there a life-saving intervention made?  No   For Abstraction Use Only   Was the patient insured? Yes       Home visit.  BP 142/82.  Medication boxes filled for 2 weeks.  Patient states he is doing well.  No edema in feet and ankles.  [name] RN, Congregational Nurse [phone]

## 2015-12-01 DIAGNOSIS — I10 Essential (primary) hypertension: Secondary | ICD-10-CM

## 2015-12-01 DIAGNOSIS — Z79899 Other long term (current) drug therapy: Secondary | ICD-10-CM

## 2015-12-01 DIAGNOSIS — Z719 Counseling, unspecified: Secondary | ICD-10-CM

## 2015-12-01 NOTE — Congregational Nurse Program (Signed)
Congregational Nurse Program Note  Date of Encounter: 12/01/2015  Past Medical History: Past Medical History  Diagnosis Date  . Gout   . CKD (chronic kidney disease) stage 4, GFR 15-29 ml/min (HCC)     Followed by [facility].  Thought to be due to long-standing HTN  . Essential hypertension   . Gout 05/04/2009    Qualifier: Diagnosis of  By: Dyann Kief MD, Clifton James    . Barrett's esophagus   . Edema   . GERD (gastroesophageal reflux disease)     Encounter Details:     CNP Questionnaire - 12/01/15 1508    Patient Demographics   Is this a new or existing patient? Existing   Patient is considered a/an Refugee   Race Asian   Patient Assistance   Location of Patient Assistance Not Applicable   Patient's financial/insurance status Low Income;Medicaid;Medicare   Uninsured Patient No   Patient referred to apply for the following financial assistance Not Applicable   Food insecurities addressed Not Applicable   Transportation assistance No   Assistance securing medications No   Type of Assistance [facility]   Educational health offerings Medications;Navigating the healthcare system   Encounter Details   Primary purpose of visit Chronic Illness/Condition Visit;Navigating the Healthcare System   Was an Emergency Department visit averted? Not Applicable   Does patient have a medical provider? Yes   Patient referred to Not Applicable   Was a mental health screening completed? (GAINS tool) No   Does patient have dental issues? No   Was a dental referral made? No   Does patient have vision issues? No   Does your patient have an abnormal blood pressure today? No   Since previous encounter, have you referred patient for abnormal blood pressure that resulted in a new diagnosis or medication change? No   Does your patient have an abnormal blood glucose today? No   Since previous encounter, have you referred patient for abnormal blood glucose that resulted in a new diagnosis or  medication change? No   Was there a life-saving intervention made? No   For Abstraction Use Only   Was the patient insured? Yes       Home visit to fill patient's pill boxes x 2 weeks.He has forgotten to take medications 4 times in past 2 weeks.  Talked with him regarding importance of taking them every day. BP 148/78.  Reminded him of appointment with Dr. Justin Mend @ [facility] on December 14, 2015 at 2:00 pm. Luke Michaelis RN, Congregational Nurse [phone]

## 2015-12-05 ENCOUNTER — Other Ambulatory Visit: Payer: Self-pay | Admitting: Internal Medicine

## 2015-12-14 DIAGNOSIS — N183 Chronic kidney disease, stage 3 (moderate): Secondary | ICD-10-CM | POA: Diagnosis not present

## 2015-12-14 DIAGNOSIS — E785 Hyperlipidemia, unspecified: Secondary | ICD-10-CM | POA: Diagnosis not present

## 2015-12-14 DIAGNOSIS — Z719 Counseling, unspecified: Secondary | ICD-10-CM

## 2015-12-14 DIAGNOSIS — N189 Chronic kidney disease, unspecified: Secondary | ICD-10-CM | POA: Diagnosis not present

## 2015-12-14 DIAGNOSIS — D631 Anemia in chronic kidney disease: Secondary | ICD-10-CM | POA: Diagnosis not present

## 2015-12-14 DIAGNOSIS — N2581 Secondary hyperparathyroidism of renal origin: Secondary | ICD-10-CM | POA: Diagnosis not present

## 2015-12-14 NOTE — Congregational Nurse Program (Signed)
Congregational Nurse Program Note  Date of Encounter: 12/14/2015  Past Medical History: Past Medical History:  Diagnosis Date  . Barrett's esophagus   . CKD (chronic kidney disease) stage 4, GFR 15-29 ml/min (HCC)    Followed by [facility].  Thought to be due to long-standing HTN  . Edema   . Essential hypertension   . GERD (gastroesophageal reflux disease)   . Gout   . Gout 05/04/2009   Qualifier: Diagnosis of  By: Dyann Kief MD, Matthias Hughs Details:     CNP Questionnaire - 12/14/15 1646      Patient Demographics   Is this a new or existing patient? Existing   Patient is considered a/an Refugee   Race Asian     Patient Assistance   Location of Patient Assistance Not Applicable   Patient's financial/insurance status Low Income;Medicaid;Medicare   Uninsured Patient No   Patient referred to apply for the following financial assistance Not Applicable   Food insecurities addressed Not Applicable   Transportation assistance No   Assistance securing medications No   Educational health offerings Medications;Navigating the healthcare system     Encounter Details   Primary purpose of visit Chronic Illness/Condition Visit;Navigating the Healthcare System   Was an Emergency Department visit averted? Not Applicable   Does patient have a medical provider? Yes   Patient referred to Not Applicable   Was a mental health screening completed? (GAINS tool) No   Does patient have dental issues? No   Was a dental referral made? No   Does patient have vision issues? No   Does your patient have an abnormal blood pressure today? No   Since previous encounter, have you referred patient for abnormal blood pressure that resulted in a new diagnosis or medication change? No   Does your patient have an abnormal blood glucose today? No   Since previous encounter, have you referred patient for abnormal blood glucose that resulted in a new diagnosis or medication change? No   Was there a  life-saving intervention made? No     For Abstraction Use Only   Was the patient insured? Yes      CN and interpreter Marva Panda accompanied patient to see Dr. Edrick Oh at [facility].  BP 136/82. Weight 206 lbs.  Dr. Justin Mend told patient that his kidney function had remained stable on his last visit and that was very good.  He wants to continue monitoring him every 6 months with lab work. [name] RN, Congregational Nurse [phone]

## 2015-12-17 ENCOUNTER — Telehealth: Payer: Self-pay

## 2015-12-17 NOTE — Telephone Encounter (Signed)
Dr. Jason Nest office called Congregational l Nurse to report that lab work done on 12/14/2015 indicate that his kidney function is stable.  [name] RN Congregational Nurse  [phone]

## 2015-12-29 DIAGNOSIS — Z719 Counseling, unspecified: Secondary | ICD-10-CM

## 2015-12-29 DIAGNOSIS — Z79899 Other long term (current) drug therapy: Secondary | ICD-10-CM

## 2015-12-29 DIAGNOSIS — I10 Essential (primary) hypertension: Secondary | ICD-10-CM

## 2015-12-29 NOTE — Congregational Nurse Program (Signed)
Congregational Nurse Program Note  Date of Encounter: 12/29/2015  Past Medical History: Past Medical History:  Diagnosis Date  . Barrett's esophagus   . CKD (chronic kidney disease) stage 4, GFR 15-29 ml/min (HCC)    Followed by [facility].  Thought to be due to long-standing HTN  . Edema   . Essential hypertension   . GERD (gastroesophageal reflux disease)   . Gout   . Gout 05/04/2009   Qualifier: Diagnosis of  By: Dyann Kief MD, Matthias Hughs Details:     CNP Questionnaire - 12/29/15 2018      Patient Demographics   Is this a new or existing patient? Existing   Patient is considered a/an Refugee   Race Asian     Patient Assistance   Location of Patient Assistance Not Applicable   Patient's financial/insurance status Low Income;Medicaid;Medicare   Uninsured Patient No   Patient referred to apply for the following financial assistance Not Applicable   Food insecurities addressed Not Applicable   Transportation assistance No   Assistance securing medications No   Educational health offerings Not Applicable     Encounter Details   Primary purpose of visit Chronic Illness/Condition Visit;Other   Was an Emergency Department visit averted? Not Applicable   Does patient have a medical provider? Yes   Patient referred to Not Applicable   Was a mental health screening completed? (GAINS tool) No   Does patient have dental issues? No   Was a dental referral made? No   Does patient have vision issues? No   Does your patient have an abnormal blood pressure today? No   Since previous encounter, have you referred patient for abnormal blood pressure that resulted in a new diagnosis or medication change? No   Does your patient have an abnormal blood glucose today? No   Since previous encounter, have you referred patient for abnormal blood glucose that resulted in a new diagnosis or medication change? No   Was there a life-saving intervention made? No     For Abstraction  Use Only   Was the patient insured? Yes      Home visit. Filled pill boxes x 2 weeks. Informed patient of appointment on 01/10/2016 at [facility] internal medicine with new doctor--Dr. Inda Castle.  BP today 138/78.  [name] RN, Congregational Nurse [phone]

## 2016-01-08 ENCOUNTER — Other Ambulatory Visit: Payer: Self-pay | Admitting: Internal Medicine

## 2016-01-08 DIAGNOSIS — N184 Chronic kidney disease, stage 4 (severe): Secondary | ICD-10-CM

## 2016-01-08 DIAGNOSIS — R600 Localized edema: Secondary | ICD-10-CM

## 2016-01-08 DIAGNOSIS — I10 Essential (primary) hypertension: Secondary | ICD-10-CM

## 2016-01-10 ENCOUNTER — Ambulatory Visit (INDEPENDENT_AMBULATORY_CARE_PROVIDER_SITE_OTHER): Payer: Medicare Other | Admitting: Internal Medicine

## 2016-01-10 ENCOUNTER — Encounter: Payer: Self-pay | Admitting: Internal Medicine

## 2016-01-10 VITALS — BP 148/78 | HR 100 | Temp 98.4°F | Wt 203.1 lb

## 2016-01-10 DIAGNOSIS — Z87891 Personal history of nicotine dependence: Secondary | ICD-10-CM | POA: Diagnosis not present

## 2016-01-10 DIAGNOSIS — K22719 Barrett's esophagus with dysplasia, unspecified: Secondary | ICD-10-CM

## 2016-01-10 DIAGNOSIS — R739 Hyperglycemia, unspecified: Secondary | ICD-10-CM | POA: Diagnosis not present

## 2016-01-10 DIAGNOSIS — I129 Hypertensive chronic kidney disease with stage 1 through stage 4 chronic kidney disease, or unspecified chronic kidney disease: Secondary | ICD-10-CM

## 2016-01-10 DIAGNOSIS — K2270 Barrett's esophagus without dysplasia: Secondary | ICD-10-CM | POA: Diagnosis not present

## 2016-01-10 DIAGNOSIS — N184 Chronic kidney disease, stage 4 (severe): Secondary | ICD-10-CM | POA: Diagnosis not present

## 2016-01-10 DIAGNOSIS — R0600 Dyspnea, unspecified: Secondary | ICD-10-CM

## 2016-01-10 DIAGNOSIS — Z719 Counseling, unspecified: Secondary | ICD-10-CM

## 2016-01-10 DIAGNOSIS — Z23 Encounter for immunization: Secondary | ICD-10-CM

## 2016-01-10 DIAGNOSIS — Z0000 Encounter for general adult medical examination without abnormal findings: Secondary | ICD-10-CM

## 2016-01-10 DIAGNOSIS — I10 Essential (primary) hypertension: Secondary | ICD-10-CM

## 2016-01-10 LAB — GLUCOSE, CAPILLARY: Glucose-Capillary: 141 mg/dL — ABNORMAL HIGH (ref 65–99)

## 2016-01-10 LAB — POCT GLYCOSYLATED HEMOGLOBIN (HGB A1C): HEMOGLOBIN A1C: 5.7

## 2016-01-10 MED ORDER — ZOSTER VACCINE LIVE 19400 UNT/0.65ML ~~LOC~~ SUSR: 0.6500 mL | Freq: Once | SUBCUTANEOUS | 0 refills | Status: AC

## 2016-01-10 NOTE — Assessment & Plan Note (Addendum)
BP Readings from Last 3 Encounters:  01/10/16 (!) 148/78  12/29/15 138/78  11/17/15 (!) 142/82   Lab Results  Component Value Date   CREATININE 4.20 (H) 10/06/2015   Lab Results  Component Value Date   K 4.5 10/06/2015    Assessment BP goal: <140/90 BP control: controlled  On amlodipine 10 mg daily and furosemide 40 mg BID.  Plan Medications: -Continue current meds  Other: -Counseled regarding diet, exercise, and weight loss

## 2016-01-10 NOTE — Assessment & Plan Note (Addendum)
Lab Results  Component Value Date   HGBA1C 5.7 01/10/2016   HGBA1C 5.7 11/30/2014   HGBA1C 5.7 06/05/2013   Hyperglycemia noted on prior random serum chemistries, last glucose 122 on 12/14/15.  -A1c today stable from prior -Repeat A1c in 1 year -Counseled regarding diet, exercise, and weight loss

## 2016-01-10 NOTE — Assessment & Plan Note (Signed)
-  Flu shot -Prevnar -Zostavax

## 2016-01-10 NOTE — Patient Instructions (Addendum)
Your blood pressure is doing well!  Since you were a smoker and are having shortness of breath, I would like you to scheduling a breathing test to see how well your lungs are working.  We gave you the flu and pneumonia vaccines today.  You can take prescription for shingles vaccine to your pharmacy.

## 2016-01-10 NOTE — Telephone Encounter (Signed)
pls remind her of appt today with PCP

## 2016-01-10 NOTE — Assessment & Plan Note (Signed)
Reviewed 12/14/15 labs from [facility].  Stable CKD 3/4, Cr 3.65.  No signs of uremia, hyperkalemia.  -Follow-up with [facility] as scheduled -Continue lasix 40 BID and Sodium Bicarb 1300 BID

## 2016-01-10 NOTE — Assessment & Plan Note (Signed)
Exertional dyspnea in patient with extensive smoking history.  No desats during ambulatory pulse oximetry in clinic.  Differential includes pulmonary and cardiac etiologies, but is most likely COPD.  Euvolemic on exam, and no chest discomfort, PND, or orthopnea to support angina or CHF.  -PFTs

## 2016-01-10 NOTE — Progress Notes (Signed)
   CC: Shortness of breath with walking  HPI:  [name] is a 65 y.o. man with history of HTN, CKD, gout, and anemia who presents for a regularly scheduled appointment for management of his chronic medical conditions.  History obtained with in-person interpreter.  Collateral from [name], RN, Scientist, research (physical sciences).  He walks 2-3 blocks to the corner store once or twice a day and gets short of breath, having to stop to rest 3 or 4 times.  This has not changed in years.  He does not have chest pain, palpitations, dizziness, or dyspnea at rest.  No PND or orthopnea. Former smoker with 70 pack year smoking history.  Past Medical History:  Diagnosis Date  . Barrett's esophagus   . CKD (chronic kidney disease) stage 4, GFR 15-29 ml/min (HCC)    Followed by [facility].  Thought to be due to long-standing HTN  . Edema   . Essential hypertension   . GERD (gastroesophageal reflux disease)   . Gout 05/04/2009   Qualifier: Diagnosis of  By: Dyann Kief MD, Clifton James      Review of Systems:   Review of Systems  Constitutional: Negative for chills, fever and weight loss.  HENT: Negative for sore throat.   Eyes: Negative for blurred vision and double vision.  Respiratory: Positive for shortness of breath. Negative for cough and wheezing.   Cardiovascular: Negative for chest pain, palpitations, orthopnea, leg swelling and PND.  Gastrointestinal: Negative for abdominal pain, blood in stool, constipation, diarrhea, heartburn, melena, nausea and vomiting.  Genitourinary: Negative for dysuria and hematuria.  Musculoskeletal: Negative for joint pain and myalgias.  Skin: Negative for rash.       Chronic itch on left upper abdomen.  Neurological: Negative for focal weakness, loss of consciousness and headaches.  Psychiatric/Behavioral: Negative for depression and substance abuse.    Physical Exam:  Vitals:   01/10/16 1506  BP: (!) 148/78  Pulse: 100  Temp: 98.4 F (36.9 C)  TempSrc:  Oral  SpO2: 98%  Weight: 203 lb 1.6 oz (92.1 kg)   O2 sats 96-100% with ambulatory pulse oximetry today.  Physical Exam  Constitutional: He is oriented to person, place, and time.  Obese, in no distress  HENT:  Head: Normocephalic and atraumatic.  Mouth/Throat: Oropharynx is clear and moist.  Poor dentition  Eyes: Conjunctivae are normal. No scleral icterus.  Neck: Normal range of motion. Neck supple.  Cardiovascular: Normal rate, regular rhythm, normal heart sounds and intact distal pulses.   Fistula on L upper arm with palpable thrill  Pulmonary/Chest: Effort normal and breath sounds normal.  Faint crackles in left lower lung field  Abdominal:  Soft, obese, non-tender  Musculoskeletal: Normal range of motion. He exhibits no edema or tenderness.  Lymphadenopathy:    He has no cervical adenopathy.  Neurological: He is alert and oriented to person, place, and time. He displays normal reflexes. No cranial nerve deficit.  Skin: Skin is warm and dry. No erythema.  ~1 cm, raised, slightly scaly, skin colored papule without erythema or hyperpigmentation below L inframammary skin fold  Psychiatric: He has a normal mood and affect. His behavior is normal.   CBC and BMP from 12/14/2015 by [facility] reviewed.  Hgb 10.6  Glucose 122 Creatinine 3.65 K 3.6 Phosphate 2.0   Assessment & Plan:   See Encounters Tab for problem based charting.  Patient seen with Dr. Evette Doffing

## 2016-01-10 NOTE — Assessment & Plan Note (Signed)
No dyspepsia or dysphagia.   -Repeat EGD due in 2018 [name] called gastoenterologist) -Continue PPI

## 2016-01-11 NOTE — Progress Notes (Signed)
Internal Medicine Clinic Attending  I saw and evaluated the patient.  I personally confirmed the key portions of the history and exam documented by Dr. O'Sullivan and I reviewed pertinent patient test results.  The assessment, diagnosis, and plan were formulated together and I agree with the documentation in the resident's note.   

## 2016-01-11 NOTE — Congregational Nurse Program (Signed)
Congregational Nurse Program Note  Date of Encounter: 01/10/2016  Past Medical History: Past Medical History:  Diagnosis Date  . Barrett's esophagus   . CKD (chronic kidney disease) stage 4, GFR 15-29 ml/min (HCC)    Followed by [facility].  Thought to be due to long-standing HTN  . Edema   . Essential hypertension   . GERD (gastroesophageal reflux disease)   . Gout 05/04/2009   Qualifier: Diagnosis of  By: Dyann Kief MD, Matthias Hughs Details:     CNP Questionnaire - 01/10/16 1651      Patient Demographics   Is this a new or existing patient? Existing   Patient is considered a/an Refugee   Race Asian     Patient Assistance   Location of Patient Assistance Not Applicable   Patient's financial/insurance status Low Income;Medicaid;Medicare   Uninsured Patient No   Patient referred to apply for the following financial assistance Not Applicable   Food insecurities addressed Not Applicable   Transportation assistance No   Assistance securing medications No   Educational health offerings Not Applicable     Encounter Details   Primary purpose of visit Chronic Illness/Condition Visit;Other   Was an Emergency Department visit averted? Not Applicable   Does patient have a medical provider? Yes   Patient referred to Not Applicable   Was a mental health screening completed? (GAINS tool) No   Does patient have dental issues? No   Was a dental referral made? No   Does patient have vision issues? No   Does your patient have an abnormal blood pressure today? No   Since previous encounter, have you referred patient for abnormal blood pressure that resulted in a new diagnosis or medication change? No   Does your patient have an abnormal blood glucose today? No   Since previous encounter, have you referred patient for abnormal blood glucose that resulted in a new diagnosis or medication change? No   Was there a life-saving intervention made? No     For Abstraction Use Only    Was the patient insured? Yes         Amb Nursing Assessment - 01/10/16 1506      Pre-visit preparation   Pre-visit preparation completed No     Pain Assessment   Pain Assessment No/denies pain     Nutrition Screen   BMI - recorded 35.98   Nutritional Status BMI > 30  Obese   Nutritional Risks None   Diabetes No     Functional Status   Activities of Daily Living (!)  Needs Assist   Feeding Independent   Dressing/Grooming Independent   Bathing Independent   Toileting Independent   Transfer Independent   Ambulation Independent   Medication Administration Independent   Home Management Independent     Risk/Barriers  Assessment   Barriers to Care Management & Learning Language  montagnard jarai     Abuse/Neglect Assessment   Do you feel unsafe in your current relationship? No   Do you feel physically threatened by others? No   Anyone hurting you at home, work, or school? No   Unable to ask? No     Patient Literacy   How often do you need to have someone help you when you read instructions, pamphlets, or other written materials from your doctor or pharmacy? 5 - Always   What is the last grade level you completed in school? 3rd or 4th grade     Programmer, multimedia  Interpreter Needed? Yes   [place]   Interpreter Name Lemont Fillers   Patient Declined Interpreter  No   Patient signed [facility] waiver No     Comments   Information entered by : Modena Morrow 01/10/2016 1518     CN accompanied patient to PCP appointment at [facility].  CN will schedule Pulmonary function test ordered by Dr. Inda Castle, provide patient with some brown rice to sample, OTC cream for itching (dry skin) on abdomin, and try to secure shingles vaccine for patient. (has prescription).  No medication changes.  Return appointment scheduled for 04/17/2016 @ 3:45 pm.  [name] RN, Congregational Nurse [phone].

## 2016-01-20 ENCOUNTER — Ambulatory Visit (HOSPITAL_COMMUNITY)
Admission: RE | Admit: 2016-01-20 | Discharge: 2016-01-20 | Disposition: A | Discharge status: Home / Self Care | Payer: Medicare Other | Source: Ambulatory Visit | Attending: Student in an Organized Health Care Education/Training Program | Admitting: Student in an Organized Health Care Education/Training Program

## 2016-01-20 DIAGNOSIS — Z719 Counseling, unspecified: Secondary | ICD-10-CM

## 2016-01-20 DIAGNOSIS — R0600 Dyspnea, unspecified: Secondary | ICD-10-CM | POA: Diagnosis not present

## 2016-01-20 LAB — PULMONARY FUNCTION TEST
DL/VA % PRED: 100 %
DL/VA: 4.11 ml/min/mmHg/L
DLCO UNC: 15.08 ml/min/mmHg
DLCO unc % pred: 65 %
FEF 25-75 POST: 1.62 L/s
FEF 25-75 Pre: 1.16 L/sec
FEF2575-%Change-Post: 39 %
FEF2575-%PRED-POST: 82 %
FEF2575-%PRED-PRE: 59 %
FEV1-%Change-Post: 8 %
FEV1-%PRED-PRE: 64 %
FEV1-%Pred-Post: 70 %
FEV1-Post: 1.75 L
FEV1-Pre: 1.6 L
FEV1FVC-%CHANGE-POST: 6 %
FEV1FVC-%PRED-PRE: 99 %
FEV6-%CHANGE-POST: 1 %
FEV6-%Pred-Post: 69 %
FEV6-%Pred-Pre: 68 %
FEV6-Post: 2.22 L
FEV6-Pre: 2.18 L
FEV6FVC-%PRED-POST: 106 %
FEV6FVC-%Pred-Pre: 106 %
FVC-%Change-Post: 1 %
FVC-%PRED-POST: 65 %
FVC-%PRED-PRE: 64 %
FVC-POST: 2.22 L
FVC-PRE: 2.18 L
POST FEV6/FVC RATIO: 100 %
PRE FEV1/FVC RATIO: 74 %
Post FEV1/FVC ratio: 79 %
Pre FEV6/FVC Ratio: 100 %
RV % pred: 80 %
RV: 1.63 L
TLC % PRED: 68 %
TLC: 3.85 L

## 2016-01-20 MED ORDER — ALBUTEROL SULFATE (2.5 MG/3ML) 0.083% IN NEBU
2.5000 mg | INHALATION_SOLUTION | Freq: Once | RESPIRATORY_TRACT | Status: AC
  Administered 2016-01-20: 2.5 mg via RESPIRATORY_TRACT

## 2016-01-20 NOTE — Congregational Nurse Program (Signed)
Congregational Nurse Program Note  Date of Encounter: 01/20/2016  Past Medical History: Past Medical History:  Diagnosis Date  . Barrett's esophagus   . CKD (chronic kidney disease) stage 4, GFR 15-29 ml/min (HCC)    Followed by [facility].  Thought to be due to long-standing HTN  . Edema   . Essential hypertension   . GERD (gastroesophageal reflux disease)   . Gout 05/04/2009   Qualifier: Diagnosis of  By: Dyann Kief MD, Matthias Hughs Details:     CNP Questionnaire - 01/20/16 2121      Patient Demographics   Is this a new or existing patient? Existing   Patient is considered a/an Refugee   Race Asian     Patient Assistance   Location of Patient Assistance Not Applicable   Patient's financial/insurance status Low Income;Medicaid;Medicare   Uninsured Patient No   Patient referred to apply for the following financial assistance Not Applicable   Food insecurities addressed Not Applicable   Transportation assistance No   Assistance securing medications No   Type of Assistance [facility]   Educational health offerings Not Applicable     Encounter Details   Primary purpose of visit Chronic Illness/Condition Visit;Other   Was an Emergency Department visit averted? Not Applicable   Does patient have a medical provider? Yes   Patient referred to Not Applicable   Was a mental health screening completed? (GAINS tool) No   Does patient have dental issues? No   Was a dental referral made? No   Does patient have vision issues? No   Does your patient have an abnormal blood pressure today? No   Since previous encounter, have you referred patient for abnormal blood pressure that resulted in a new diagnosis or medication change? No   Does your patient have an abnormal blood glucose today? No   Since previous encounter, have you referred patient for abnormal blood glucose that resulted in a new diagnosis or medication change? No   Was there a life-saving intervention  made? No     For Abstraction Use Only   Was the patient insured? Yes         Amb Nursing Assessment - 01/10/16 1506      Pre-visit preparation   Pre-visit preparation completed No     Pain Assessment   Pain Assessment No/denies pain     Nutrition Screen   BMI - recorded 35.98   Nutritional Status BMI > 30  Obese   Nutritional Risks None   Diabetes No     Functional Status   Activities of Daily Living (!)  Needs Assist   Feeding Independent   Dressing/Grooming Independent   Bathing Independent   Toileting Independent   Transfer Independent   Ambulation Independent   Medication Administration Independent   Home Management Independent     Risk/Barriers  Assessment   Barriers to Care Management & Learning Language  montagnard jarai     Abuse/Neglect Assessment   Do you feel unsafe in your current relationship? No   Do you feel physically threatened by others? No   Anyone hurting you at home, work, or school? No   Unable to ask? No     Patient Literacy   How often do you need to have someone help you when you read instructions, pamphlets, or other written materials from your doctor or pharmacy? 5 - Always   What is the last grade level you completed in school? 3rd or 4th grade  Investment banker, operational Needed? Yes   [place]   Interpreter Name Lemont Fillers   Patient Declined Interpreter  No   Patient signed [facility] waiver No     Comments   Information entered by : Modena Morrow 01/10/2016 1518     Accompanied patient to pulmonary function test at [facility]

## 2016-01-26 DIAGNOSIS — Z79899 Other long term (current) drug therapy: Secondary | ICD-10-CM

## 2016-01-26 DIAGNOSIS — I10 Essential (primary) hypertension: Secondary | ICD-10-CM

## 2016-01-26 DIAGNOSIS — Z719 Counseling, unspecified: Secondary | ICD-10-CM

## 2016-01-26 NOTE — Congregational Nurse Program (Signed)
Congregational Nurse Program Note  Date of Encounter: 01/26/2016  Past Medical History: Past Medical History:  Diagnosis Date  . Barrett's esophagus   . CKD (chronic kidney disease) stage 4, GFR 15-29 ml/min (HCC)    Followed by [facility].  Thought to be due to long-standing HTN  . Edema   . Essential hypertension   . GERD (gastroesophageal reflux disease)   . Gout 05/04/2009   Qualifier: Diagnosis of  By: Dyann Kief MD, Matthias Hughs Details:     CNP Questionnaire - 01/26/16 1528      Patient Demographics   Is this a new or existing patient? Existing   Patient is considered a/an Refugee   Race Asian     Patient Assistance   Location of Patient Assistance Not Applicable   Patient's financial/insurance status Low Income;Medicaid;Medicare   Uninsured Patient No   Patient referred to apply for the following financial assistance Not Applicable   Food insecurities addressed Not Applicable   Transportation assistance No   Assistance securing medications No   Type of Assistance [facility];Other   Educational health offerings Not Applicable     Encounter Details   Primary purpose of visit Chronic Illness/Condition Visit;Other   Was an Emergency Department visit averted? Not Applicable   Does patient have a medical provider? Yes   Patient referred to Not Applicable   Was a mental health screening completed? (GAINS tool) No   Does patient have dental issues? No   Was a dental referral made? No   Does patient have vision issues? No   Does your patient have an abnormal blood pressure today? No   Since previous encounter, have you referred patient for abnormal blood pressure that resulted in a new diagnosis or medication change? No   Does your patient have an abnormal blood glucose today? No   Since previous encounter, have you referred patient for abnormal blood glucose that resulted in a new diagnosis or medication change? No   Was there a life-saving  intervention made? No     For Abstraction Use Only   Was the patient insured? Yes         Amb Nursing Assessment - 01/10/16 1506      Pre-visit preparation   Pre-visit preparation completed No     Pain Assessment   Pain Assessment No/denies pain     Nutrition Screen   BMI - recorded 35.98   Nutritional Status BMI > 30  Obese   Nutritional Risks None   Diabetes No     Functional Status   Activities of Daily Living (!)  Needs Assist   Feeding Independent   Dressing/Grooming Independent   Bathing Independent   Toileting Independent   Transfer Independent   Ambulation Independent   Medication Administration Independent   Home Management Independent     Risk/Barriers  Assessment   Barriers to Care Management & Learning Language  montagnard jarai     Abuse/Neglect Assessment   Do you feel unsafe in your current relationship? No   Do you feel physically threatened by others? No   Anyone hurting you at home, work, or school? No   Unable to ask? No     Patient Literacy   How often do you need to have someone help you when you read instructions, pamphlets, or other written materials from your doctor or pharmacy? 5 - Always   What is the last grade level you completed in school? 3rd or 4th grade  Investment banker, operational Needed? Yes   [place]   Interpreter Name Lemont Fillers   Patient Declined Interpreter  No   Patient signed [facility] waiver No     Comments   Information entered by : Modena Morrow 01/10/2016 1518     Home visit and pharmacy visit to pick up medication for patient.  Filled pill boxes for 2 weeks.  BP 150/80.  States he took medication today.  Discussed shingles vaccine--told patient he can go to [facility] on [address] And receive the shot for $3.70.  He plans to go on Friday 01/28/2016.  Also gave instructions for using an OTC anti-itch cream for a dry patch on his abdomen which Dr. Inda Castle checked on last  visit.  Gave patient a bag of brown rice also recommended by his doctor.  [name] RN, Congregational Nurse [phone].

## 2016-01-30 ENCOUNTER — Encounter: Payer: Self-pay | Admitting: Internal Medicine

## 2016-02-09 DIAGNOSIS — Z79899 Other long term (current) drug therapy: Secondary | ICD-10-CM

## 2016-02-09 DIAGNOSIS — Z719 Counseling, unspecified: Secondary | ICD-10-CM

## 2016-02-09 DIAGNOSIS — I10 Essential (primary) hypertension: Secondary | ICD-10-CM

## 2016-02-09 NOTE — Congregational Nurse Program (Signed)
Congregational Nurse Program Note  Date of Encounter: 02/09/2016  Past Medical History: Past Medical History:  Diagnosis Date  . Barrett's esophagus   . CKD (chronic kidney disease) stage 4, GFR 15-29 ml/min (HCC)    Followed by [facility].  Thought to be due to long-standing HTN  . Edema   . Essential hypertension   . GERD (gastroesophageal reflux disease)   . Gout 05/04/2009   Qualifier: Diagnosis of  By: Dyann Kief MD, Matthias Hughs Details: Home visit. Pill box filled for 2 weeks.  BP 130/80.  Reviewed mail from [facility] regarding pulmonary function test results and letter from [employer] and explained contents to patient.  [name] RN, Congregational Nurse [phone]     CNP Questionnaire - 02/09/16 1709      Patient Demographics   Is this a new or existing patient? Existing   Patient is considered a/an Refugee   Race Asian     Patient Assistance   Location of Patient Assistance Not Applicable   Patient's financial/insurance status Low Income;Medicaid;Medicare   Uninsured Patient No   Patient referred to apply for the following financial assistance Not Applicable   Food insecurities addressed Not Applicable   Transportation assistance No   Assistance securing medications No   Educational health offerings Not Applicable     Encounter Details   Primary purpose of visit Chronic Illness/Condition Visit;Other   Was an Emergency Department visit averted? Not Applicable   Does patient have a medical provider? Yes   Patient referred to Not Applicable   Was a mental health screening completed? (GAINS tool) No   Does patient have dental issues? No   Was a dental referral made? No   Does patient have vision issues? No   Does your patient have an abnormal blood pressure today? No   Since previous encounter, have you referred patient for abnormal blood pressure that resulted in a new diagnosis or medication change? No   Does your patient have an abnormal  blood glucose today? No   Since previous encounter, have you referred patient for abnormal blood glucose that resulted in a new diagnosis or medication change? No   Was there a life-saving intervention made? No     For Abstraction Use Only   Was the patient insured? Yes

## 2016-02-23 DIAGNOSIS — I10 Essential (primary) hypertension: Secondary | ICD-10-CM

## 2016-02-23 DIAGNOSIS — Z79899 Other long term (current) drug therapy: Secondary | ICD-10-CM

## 2016-02-23 NOTE — Congregational Nurse Program (Signed)
Congregational Nurse Program Note  Date of Encounter: 02/23/2016  Past Medical History: Past Medical History:  Diagnosis Date  . Barrett's esophagus   . CKD (chronic kidney disease) stage 4, GFR 15-29 ml/min (HCC)    Followed by [facility].  Thought to be due to long-standing HTN  . Edema   . Essential hypertension   . GERD (gastroesophageal reflux disease)   . Gout 05/04/2009   Qualifier: Diagnosis of  By: Dyann Kief MD, Matthias Hughs Details:Home visit.  Filled pill box for 2 weeks.  BP 146/78.  [name] RN Congregational Nurse [phone].     CNP Questionnaire - 02/23/16 1752      Patient Demographics   Is this a new or existing patient? Existing   Patient is considered a/an Refugee   Race Asian     Patient Assistance   Location of Patient Assistance Not Applicable   Patient's financial/insurance status Low Income;Medicaid;Medicare   Uninsured Patient No   Patient referred to apply for the following financial assistance Not Applicable   Food insecurities addressed Not Applicable   Transportation assistance No   Assistance securing medications No   Type of Assistance [facility];Other   Educational health offerings Not Applicable     Encounter Details   Primary purpose of visit Chronic Illness/Condition Visit;Other   Was an Emergency Department visit averted? Not Applicable   Does patient have a medical provider? Yes   Patient referred to Not Applicable   Was a mental health screening completed? (GAINS tool) No   Does patient have dental issues? No   Was a dental referral made? No   Does patient have vision issues? No   Does your patient have an abnormal blood pressure today? No   Since previous encounter, have you referred patient for abnormal blood pressure that resulted in a new diagnosis or medication change? No   Does your patient have an abnormal blood glucose today? No   Since previous encounter, have you referred patient for abnormal  blood glucose that resulted in a new diagnosis or medication change? No   Was there a life-saving intervention made? No     For Abstraction Use Only   Was the patient insured? Yes

## 2016-02-29 ENCOUNTER — Other Ambulatory Visit: Payer: Self-pay | Admitting: Internal Medicine

## 2016-03-08 DIAGNOSIS — Z719 Counseling, unspecified: Secondary | ICD-10-CM

## 2016-03-08 DIAGNOSIS — Z79899 Other long term (current) drug therapy: Secondary | ICD-10-CM

## 2016-03-08 NOTE — Congregational Nurse Program (Signed)
Congregational Nurse Program Note  Date of Encounter: 03/08/2016  Past Medical History: Past Medical History:  Diagnosis Date  . Barrett's esophagus   . CKD (chronic kidney disease) stage 4, GFR 15-29 ml/min (HCC)    Followed by [facility].  Thought to be due to long-standing HTN  . Edema   . Essential hypertension   . GERD (gastroesophageal reflux disease)   . Gout 05/04/2009   Qualifier: Diagnosis of  By: Dyann Kief MD, Matthias Hughs Details: Home visit.  Filled pill box for 2 weeks.  BP 150/78.  Has a lot  Of edema in feet, ankles and legs.  States he did yard work at his church on Saturday 03/04/2016 and noticed the swelling on Monday.  CN told him that I was going to call his PCP.  Patient refused to have doctor notified. Advised him to keep his feet elevated.  CN will follow-up on Monday 03/13/2016 and if no improvement will contact PCP.  [name] RN, Congregational Nurse [phone]     CNP Questionnaire - 03/08/16 1732      Patient Demographics   Is this a new or existing patient? Existing   Patient is considered a/an Refugee   Race Asian     Patient Assistance   Location of Patient Assistance Not Applicable   Patient's financial/insurance status Low Income;Medicaid;Medicare   Uninsured Patient No   Patient referred to apply for the following financial assistance Not Applicable   Food insecurities addressed Not Applicable   Transportation assistance No   Assistance securing medications No   Educational health offerings Not Applicable     Encounter Details   Primary purpose of visit Chronic Illness/Condition Visit;Other   Was an Emergency Department visit averted? Not Applicable   Does patient have a medical provider? Yes   Patient referred to Not Applicable   Was a mental health screening completed? (GAINS tool) No   Does patient have dental issues? No   Was a dental referral made? No   Does patient have vision issues? No   Does your patient  have an abnormal blood pressure today? No   Since previous encounter, have you referred patient for abnormal blood pressure that resulted in a new diagnosis or medication change? No   Does your patient have an abnormal blood glucose today? No   Since previous encounter, have you referred patient for abnormal blood glucose that resulted in a new diagnosis or medication change? No   Was there a life-saving intervention made? No     For Abstraction Use Only   Was the patient insured? Yes

## 2016-03-13 DIAGNOSIS — Z719 Counseling, unspecified: Secondary | ICD-10-CM

## 2016-03-13 NOTE — Congregational Nurse Program (Signed)
Congregational Nurse Program Note  Date of Encounter: 03/13/2016  Past Medical History: Past Medical History:  Diagnosis Date  . Barrett's esophagus   . CKD (chronic kidney disease) stage 4, GFR 15-29 ml/min (HCC)    Followed by [facility].  Thought to be due to long-standing HTN  . Edema   . Essential hypertension   . GERD (gastroesophageal reflux disease)   . Gout 05/04/2009   Qualifier: Diagnosis of  By: Dyann Kief MD, Matthias Hughs Details:  Home visit.  Still has significant edema in feet and lower legs however it appears to have decreased.  Also appears to have enlarged abdomen with some S.O.B. BP 160/80. Taking medication as directed. Patient states he is better and still refuses to go to doctor.  Will visit again on 03/15/2016 with interpreter in an attempt to convince him to go to doctor.  [name] RN, Congregational Nurse [phone].     CNP Questionnaire - 03/13/16 2012      Patient Demographics   Is this a new or existing patient? Existing   Patient is considered a/an Refugee   Race Asian     Patient Assistance   Location of Patient Assistance Not Applicable   Patient's financial/insurance status Medicare   Uninsured Patient No   Patient referred to apply for the following financial assistance Not Applicable   Food insecurities addressed Not Applicable   Transportation assistance Yes   Type of Assistance Other   Assistance securing medications No   Educational health offerings Chronic disease     Encounter Details   Primary purpose of visit Chronic Illness/Condition Visit   Was an Emergency Department visit averted? Not Applicable   Does patient have a medical provider? Yes   Patient referred to Follow up with established PCP   Was a mental health screening completed? (GAINS tool) No   Does patient have dental issues? No   Was a dental referral made? No   Does patient have vision issues? No   Does your patient have an abnormal blood  pressure today? Yes   Since previous encounter, have you referred patient for abnormal blood pressure that resulted in a new diagnosis or medication change? No   Does your patient have an abnormal blood glucose today? No   Since previous encounter, have you referred patient for abnormal blood glucose that resulted in a new diagnosis or medication change? No   Was there a life-saving intervention made? No     For Abstraction Use Only   Was the patient insured? Yes

## 2016-03-15 DIAGNOSIS — Z79899 Other long term (current) drug therapy: Secondary | ICD-10-CM

## 2016-03-15 DIAGNOSIS — Z719 Counseling, unspecified: Secondary | ICD-10-CM

## 2016-03-15 NOTE — Congregational Nurse Program (Signed)
Congregational Nurse Program Note  Date of Encounter: 03/15/2016  Past Medical History: Past Medical History:  Diagnosis Date  . Barrett's esophagus   . CKD (chronic kidney disease) stage 4, GFR 15-29 ml/min (HCC)    Followed by [facility].  Thought to be due to long-standing HTN  . Edema   . Essential hypertension   . GERD (gastroesophageal reflux disease)   . Gout 05/04/2009   Qualifier: Diagnosis of  By: Dyann Kief MD, Matthias Hughs Details:  [facility]  Swelling in right foot much better.  Left foot and ankle still have moderate edema.  States breathing is improved and in general feels better.  BP 152/88.  Although he has 3 extra days of medication remaining in his pill box he affirms that he has taken his medicine each day--finally conceded that maybe he had forgotten to take it.  Stressed importance of not missing medication.  Plan to visit again in 1 week.  [name] RN, Congregational Nurse [phone]     CNP Questionnaire - 03/15/16 2033      Patient Demographics   Is this a new or existing patient? Existing   Patient is considered a/an Refugee   Race Asian     Patient Assistance   Location of Patient Assistance Not Applicable   Patient's financial/insurance status Medicare   Uninsured Patient No   Patient referred to apply for the following financial assistance Not Applicable   Food insecurities addressed Not Applicable   Transportation assistance No   Type of Assistance Other   Assistance securing medications No   Educational health offerings Chronic disease     Encounter Details   Primary purpose of visit Chronic Illness/Condition Visit   Was an Emergency Department visit averted? Not Applicable   Does patient have a medical provider? Yes   Was a mental health screening completed? (GAINS tool) No   Does patient have dental issues? No   Was a dental referral made? No   Does patient have vision issues? No   Does  your patient have an abnormal blood pressure today? Yes   Since previous encounter, have you referred patient for abnormal blood pressure that resulted in a new diagnosis or medication change? No   Does your patient have an abnormal blood glucose today? No   Since previous encounter, have you referred patient for abnormal blood glucose that resulted in a new diagnosis or medication change? No   Was there a life-saving intervention made? No     For Abstraction Use Only   Was the patient insured? Yes

## 2016-03-16 IMAGING — CT CT ABD-PELV W/O CM
2 of 4 series · 16 of 46 positions shown, 18 images · non-contrast
Comparison: Abdominal radiographs 01/13/2014

CLINICAL DATA: Abdominal mass.  Pain.

EXAM:
CT ABDOMEN AND PELVIS WITHOUT CONTRAST
TECHNIQUE: Multidetector CT imaging of the abdomen and pelvis was performed
following the standard protocol without IV contrast.

[Series 2: abd/ pelvis 5.0 i30f 1 · axial · 0.77mm/px · z∈[-256,+189]mm · 13 of 97 slices shown, 15 images]
[im 4/97  soft-tissue]
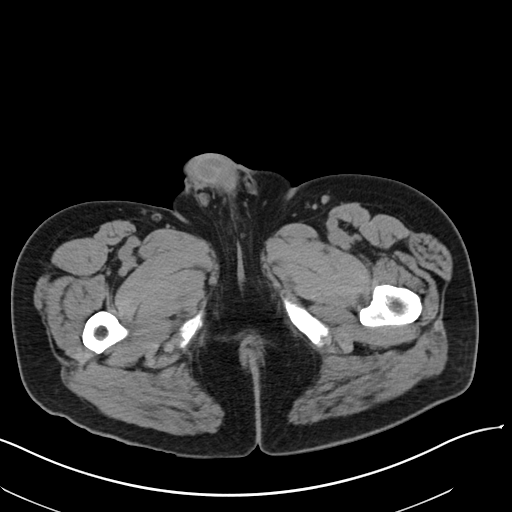
[im 4/97  bone]
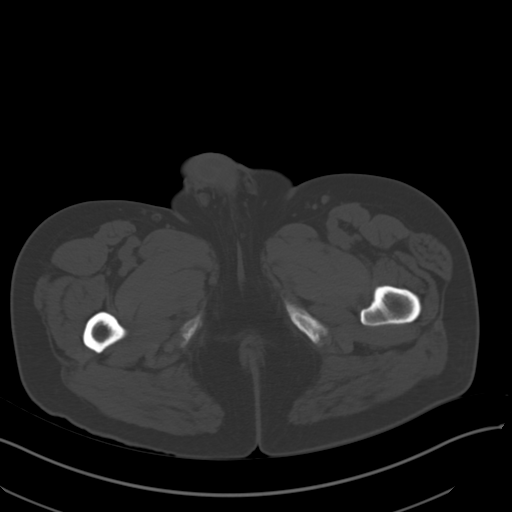
[im 12/97  soft-tissue]
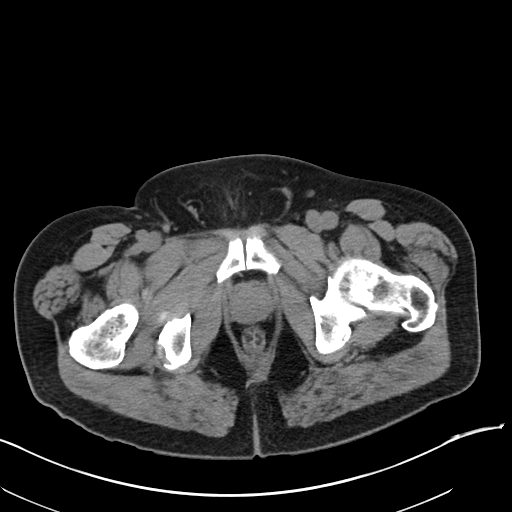
[im 20/97  soft-tissue]
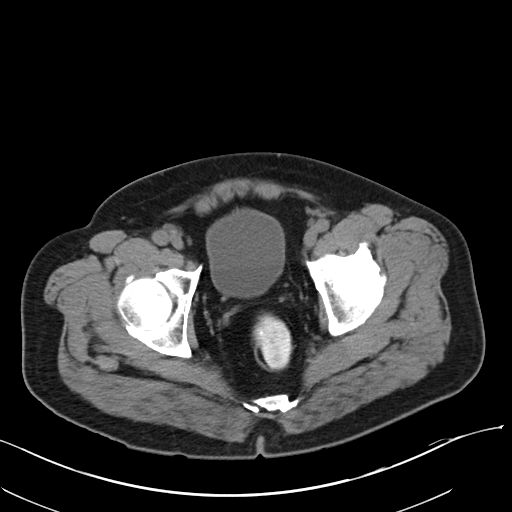
[im 27/97  soft-tissue]
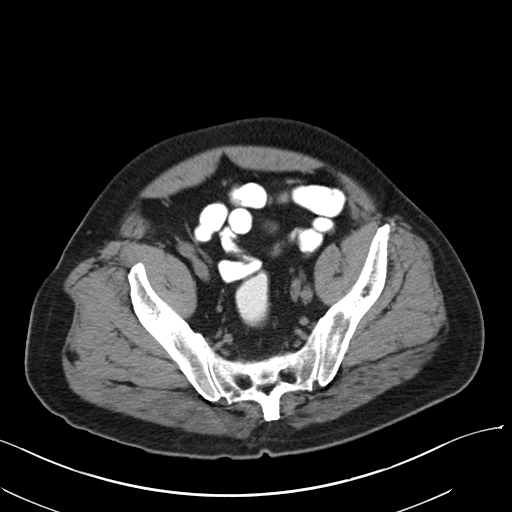
[im 35/97  soft-tissue]
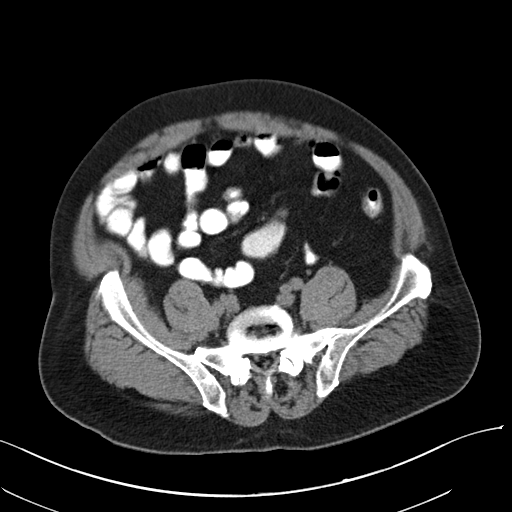
[im 43/97  soft-tissue]
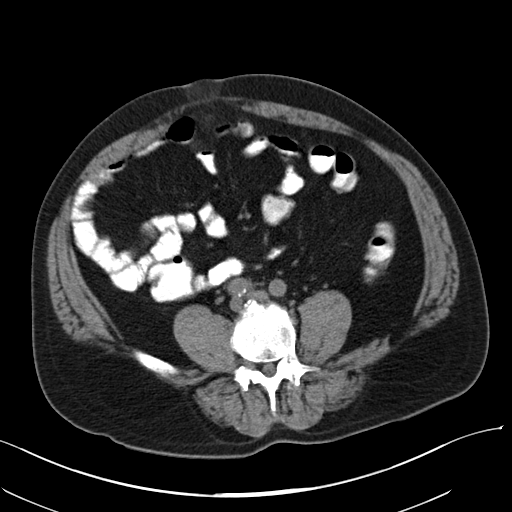
[im 50/97  soft-tissue]
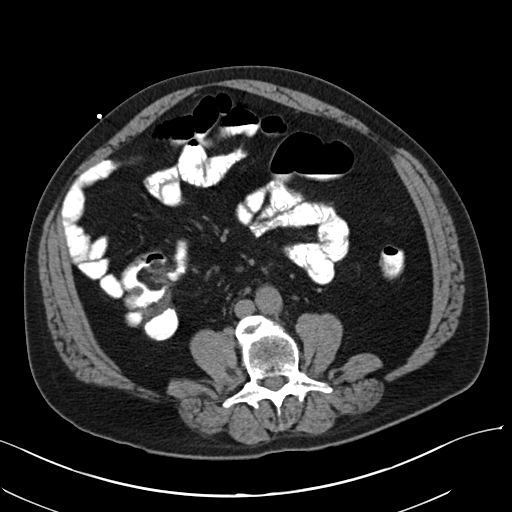
[im 54/97  soft-tissue]
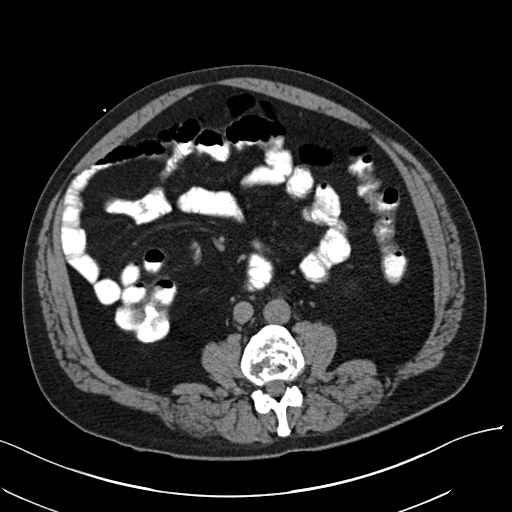
[im 62/97  soft-tissue]
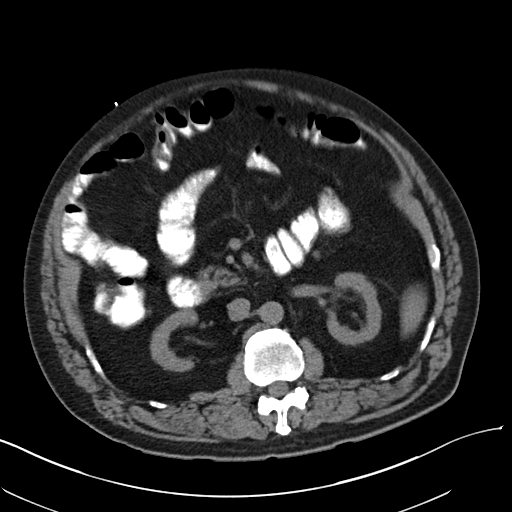
[im 62/97  bone]
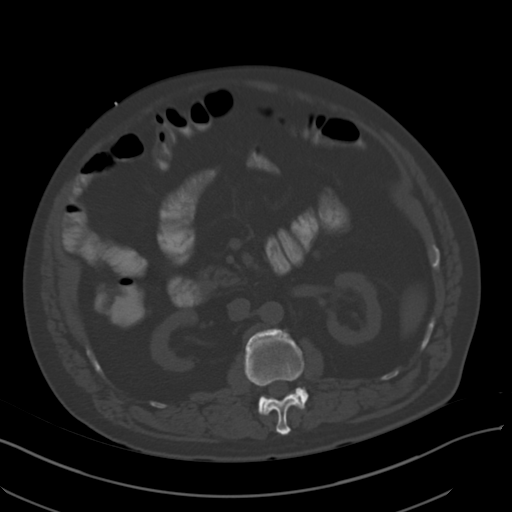
[im 70/97  soft-tissue]
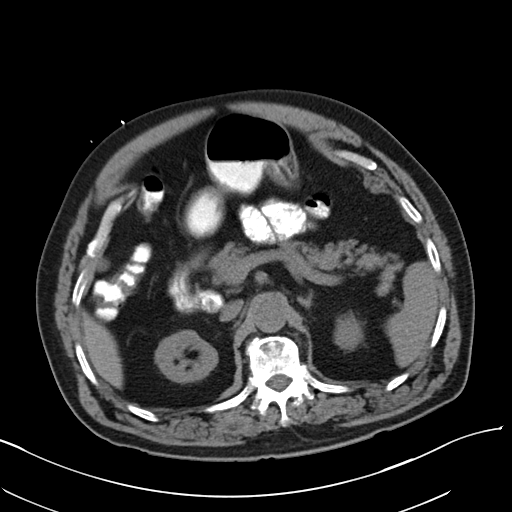
[im 77/97  soft-tissue]
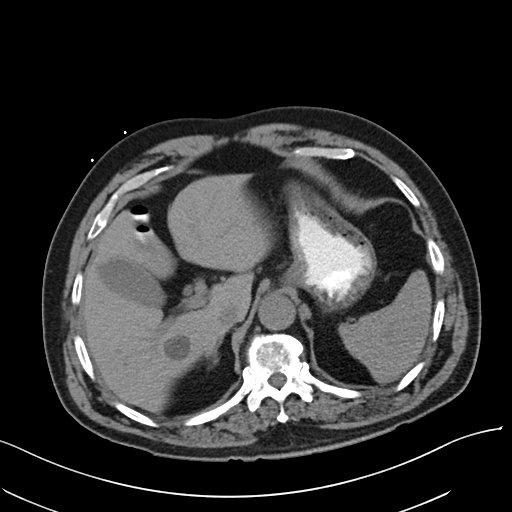
[im 85/97  soft-tissue]
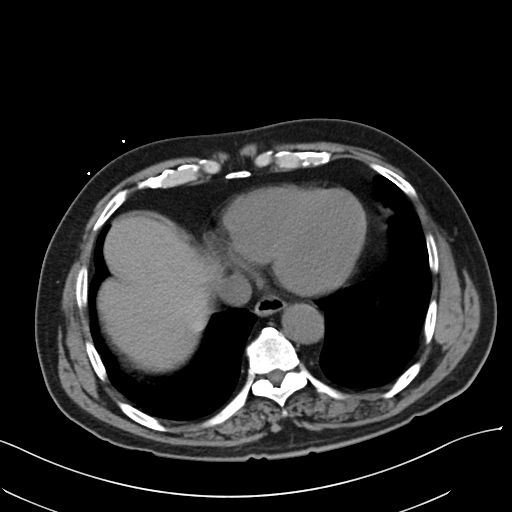
[im 93/97  soft-tissue]
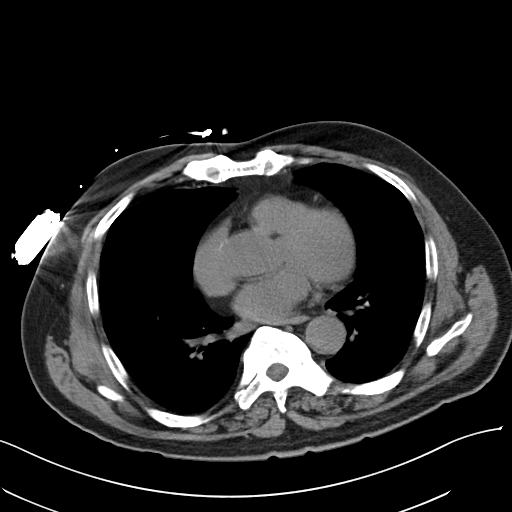

[Series 5: coronals · coronal · 0.72mm/px · 3 of 156 slices shown]
[im 52/156  soft-tissue]
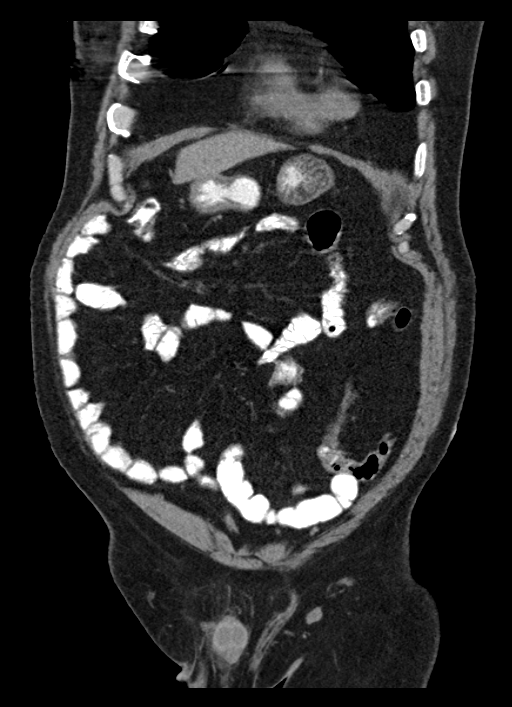
[im 69/156  soft-tissue]
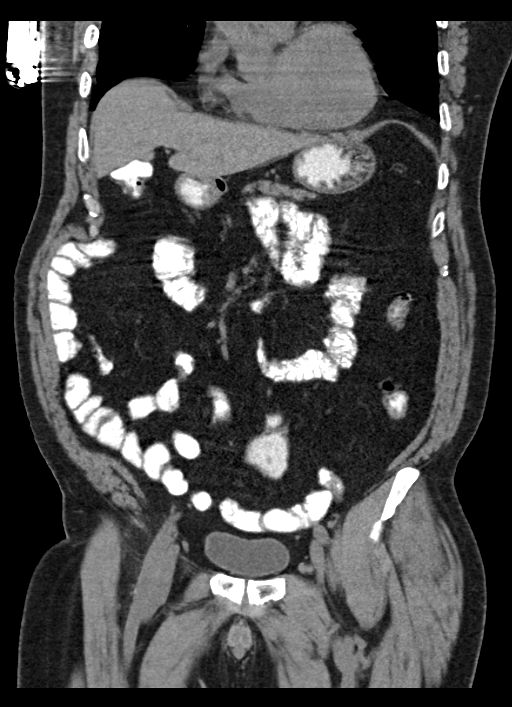
[im 87/156  soft-tissue]
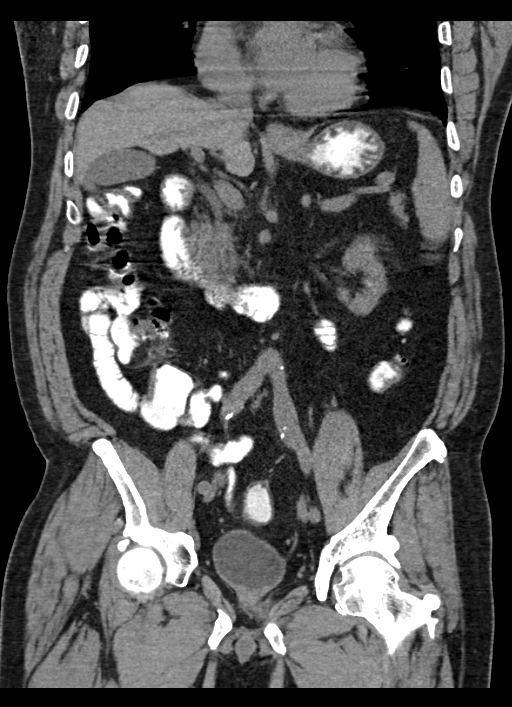

[16 of 46 positions shown; findings below may reference images not displayed]

FINDINGS: Lung bases are clear. 2.3 cm diameter circumscribed low-attenuation
lesion in the in the segment 6 of the liver, likely representing a
cyst. Unenhanced appearance of the gallbladder, spleen, pancreas,
adrenal glands, abdominal aorta, inferior vena cava, and
retroperitoneal lymph nodes is unremarkable. Diffuse parenchymal
atrophy in the kidneys. Sub cm exophytic lesion in the right kidney
upper pole probably represents a cyst. Stomach, small bowel, and
colon are unremarkable. Prominent visceral adipose tissues. No free
air or free fluid in the abdomen. There is a midline abdominal wall
hernia at the umbilicus containing fat. Appearance of mild bulging
of the flank muscles inferior to the rib cage without discrete
herniation. This appearance accounts for the soft tissue prominence
in the right flank on prior plain film. No discrete mass is
identified.

Pelvis: Prostate gland is not enlarged. Bladder wall is not
thickened. No free or loculated pelvic fluid collections. No pelvic
mass or lymphadenopathy. Small amount of fat in the left inguinal
region. Degenerative changes throughout the spine. No destructive
bone lesions appreciated. Reversal of the usual lumbar lordosis is
likely degenerative.
IMPRESSION: Prominent visceral adipose tissues with abdominal protrusion under
the ribcage appears to cause the soft tissue shadowing seen on
previous plain film. No discrete mass or hematoma is demonstrated.
There is a moderate-sized umbilical hernia containing fat. Renal
atrophy. Hepatic and renal cysts.

## 2016-03-22 ENCOUNTER — Telehealth: Payer: Self-pay

## 2016-03-22 NOTE — Telephone Encounter (Signed)
CN and interpreter [name] called patient to ask if his feet and ankles are still swollen.  He states they are no longer swollen.  Still has some S.O.B. But continues to refuse to go to doctor.  Will home visit in 1 week.  Picked up Sodium Bicarb from [employer] and will take to him next week. [name] RN, Congregational Nurse [phone]

## 2016-04-03 ENCOUNTER — Ambulatory Visit (INDEPENDENT_AMBULATORY_CARE_PROVIDER_SITE_OTHER): Payer: Medicare Other | Admitting: Internal Medicine

## 2016-04-03 VITALS — BP 131/71 | HR 94 | Temp 98.0°F | Ht 63.0 in | Wt 206.8 lb

## 2016-04-03 DIAGNOSIS — M62838 Other muscle spasm: Secondary | ICD-10-CM | POA: Insufficient documentation

## 2016-04-03 DIAGNOSIS — N184 Chronic kidney disease, stage 4 (severe): Secondary | ICD-10-CM | POA: Diagnosis not present

## 2016-04-03 DIAGNOSIS — Z87891 Personal history of nicotine dependence: Secondary | ICD-10-CM

## 2016-04-03 DIAGNOSIS — E8770 Fluid overload, unspecified: Secondary | ICD-10-CM | POA: Insufficient documentation

## 2016-04-03 MED ORDER — BACLOFEN 10 MG PO TABS: 2.5000 mg | ORAL_TABLET | Freq: Two times a day (BID) | ORAL | 1 refills | Status: DC | PRN

## 2016-04-03 NOTE — Progress Notes (Signed)
   CC: back pain   HPI: [name] is a 65 y.o. with past medical history as outlined below who presents to acute care clinic for reoccurring extremity swelling and new left sided back pain.  For the past 3 weeks he has had significant bilateral leg swelling and left arm swelling. During this time of swelling he had one episode of chest tightness and shortness of breath which woke him up from sleep. The chest tightness lasted about 2 minutes and resolved when he sat up. He never experienced any chest pain or tightness like this in the past. He states that the swelling resolved about 3 days ago after his [place] advised him to decrease his salt intake. At this time he continues to experience some shortness of breath on lying down, however he denies any chest pain, shortness of breath, cough, or leg swelling some. He denies chest pain or shortness of breath on exertion.  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 The pain is aching and is made worse with movement. He has not tried anything such as ice or medication to alleviate the pain.    Please see problem list for status of the pt's chronic medical problems.  Past Medical History:  Diagnosis Date  . Barrett's esophagus   . CKD (chronic kidney disease) stage 4, GFR 15-29 ml/min (HCC)    Followed by [facility].  Thought to be due to long-standing HTN  . Edema   . Essential hypertension   . GERD (gastroesophageal reflux disease)   . Gout 05/04/2009   Qualifier: Diagnosis of  By: Dyann Kief MD, Clifton James      Review of Systems:   Review of Systems  Respiratory: Negative for cough and shortness of breath.   Cardiovascular: Positive for orthopnea. Negative for chest pain and leg swelling.  Gastrointestinal: Negative for abdominal pain.  Genitourinary: Negative for dysuria, flank pain and urgency.  Musculoskeletal: Positive for back pain.   Physical Exam:  Vitals:   04/03/16 1041    BP: 131/71  Pulse: 94  Temp: 98 F (36.7 C)  TempSrc: Oral  SpO2: 98%  Weight: 206 lb 12.8 oz (93.8 kg)  Height: 5\' 3"  (1.6 m)   Physical Exam  Cardiovascular: Normal rate and regular rhythm.   No murmur heard. Pulmonary/Chest: Effort normal. He has no wheezes. He has rales.  Bibasilar crackles   Abdominal: Bowel sounds are normal. He exhibits distension. There is no tenderness. There is no guarding.  Musculoskeletal: He exhibits edema.  1+ bilateral lower extremity pitting edema  Lumbar paraspinal muscle spasm. No pain over greater trochanter     Assessment & Plan:   See Encounters Tab for problem based charting.   Patient seen with Dr. Eppie Gibson

## 2016-04-03 NOTE — Assessment & Plan Note (Addendum)
CKD stage 4/5 with last crt 09/2015 4.2 and GFR 14. He is due to follow up with [facility] around January of next year.   Urinalysis ordered to evaluate for proteinuria   Addendum: Urinalysis showed 3+ protein. Proteinuria will need to be quantified. He will be scheduled for a follow up appointment in the [facility] clinic with his interpreter.

## 2016-04-03 NOTE — Congregational Nurse Program (Signed)
Congregational Nurse Program Note  Date of Encounter: 03/29/2016  Past Medical History: Past Medical History:  Diagnosis Date  . Barrett's esophagus   . CKD (chronic kidney disease) stage 4, GFR 15-29 ml/min (HCC)    Followed by [facility].  Thought to be due to long-standing HTN  . Edema   . Essential hypertension   . GERD (gastroesophageal reflux disease)   . Gout 05/04/2009   Qualifier: Diagnosis of  By: Dyann Kief MD, Matthias Hughs Details: Home visit with interpreter [facility].  Patient's feet, ankles and lower legs continue to have significant swelling. He also complains of left lower back pain especially when going from sitting to supine position--present about 3 weeks. Taking medications as ordered. Discussed limiting salt intake.He agreed to visit MD. CN called [facility] and scheduled appointment for 04/03/2016 @ 10:15 am.  CN will accompany patient to appointment.  [name] RN, Congregational Nurse [phone]     CNP Questionnaire - 03/29/16 2034      Patient Demographics   Is this a new or existing patient? Existing   Patient is considered a/an Refugee   Race Asian     Patient Assistance   Location of Patient Assistance Not Applicable   Patient's financial/insurance status Medicare   Uninsured Patient ([facility]) No   Patient referred to apply for the following financial assistance Not Applicable   Food insecurities addressed Not Applicable   Transportation assistance No   Type of Assistance Other   Assistance securing medications No   Educational health offerings Medications;Hypertension;Nutrition     Encounter Details   Primary purpose of visit Chronic Illness/Condition Visit;Acute Illness/Condition Visit   Was an Emergency Department visit averted? Not Applicable   Does patient have a medical provider? Yes   Patient referred to Follow up with established PCP   Was a mental health screening completed? (GAINS  tool) No   Does patient have dental issues? No   Was a dental referral made? No   Does patient have vision issues? No   Does your patient have an abnormal blood pressure today? No   Since previous encounter, have you referred patient for abnormal blood pressure that resulted in a new diagnosis or medication change? No   Does your patient have an abnormal blood glucose today? No   Since previous encounter, have you referred patient for abnormal blood glucose that resulted in a new diagnosis or medication change? No   Was there a life-saving intervention made? No     For Abstraction Use Only   Was the patient insured? Yes         Amb Nursing Assessment - 04/03/16 1043      Pre-visit preparation   Pre-visit preparation completed Yes     Pain Assessment   Pain Assessment 0-10   Pain Score 4    Pain Type Chronic pain   Pain Location Hip   Pain Orientation Left   Pain Radiating Towards stays in hips   Pain Descriptors / Indicators Aching   Pain Onset More than a month ago   Pain Frequency Intermittent   Pain Relieving Factors not doing anything    Effect of Pain on Daily Activities tolerates      Nutrition Screen   BMI - recorded 36.63   Nutritional Status BMI > 30  Obese   Nutritional Risks None   Diabetes No     Functional Status   Activities of Daily Living Independent  Ambulation Independent   Medication Administration Independent   Home Management Independent     Risk/Barriers  Assessment   Barriers to Care Management & Learning Language  [place] interpreter      Abuse/Neglect Assessment   Do you feel unsafe in your current relationship? No   Do you feel physically threatened by others? No   Anyone hurting you at home, work, or school? No   Unable to ask? No   Information provided on Community resources No     Patient Literacy   How often do you need to have someone help you when you read instructions, pamphlets, or other written materials from your doctor or  pharmacy? 5 - Always   What is the last grade level you completed in school? None      Investment banker, operational Needed? Yes   [place]    Interpreter Name Modena Nunnery    Patient Declined Interpreter  No   Patient signed [facility] waiver No     Comments   Information entered by : Sander Nephew, RN 04/03/2016 10:548 AM

## 2016-04-03 NOTE — Assessment & Plan Note (Addendum)
For the past 3 weeks he has had significant bilateral leg swelling and left arm swelling. During this time of swelling he had one episode of chest tightness and shortness of breath which woke him up from sleep. The chest tightness lasted about 2 minutes and resolved when he sat up. He never experienced any chest pain or tightness like this in the past. He states that the swelling resolved about 3 days ago after his [place] advised him to decrease his salt intake. At this time he continues to experience some shortness of breath on lying down, however he denies any chest pain, shortness of breath, cough, or leg swelling some. He denies chest pain or shortness of breath on exertion. He has had similar episodes of swelling like this in the past which resolve when he decreases his salt intake. On exam he has bibasilar crackles, abdominal distension and 1+ bilateral lower extremity pitting edema suggestive of mild volume overload. He has no record of cardiac assessment in the past. Given his history of Stage IV/V CKD this volume overload may be related to his worsening kidney function however cardiac causes cannot be ruled out.   Ordered echocardiogram  Advised to call us if he develops any further episodes of chest pain or shortness of breath Low threshold to order nuclear stress test if his symptoms worsen given his coronary equivalent risk of CKD Continue salt and fluid restriction until she has completed cardiac workup Ordered Urinalysis to assess for proteinuria   Addendum: ECHO revealed EF 55% with grade 1 diastolic dysfunction. Heart failure is not the cause of his episodic volume overload.

## 2016-04-03 NOTE — Patient Instructions (Addendum)
It was a pleasure to meet you today Mr. Suess!   We will send a referral to the cardiologist for the ultrasound of your heart (echocardiogram) please call us if you have not heard from them within the next week   Please keep limiting the amount of salt you add to your food, no more than 2 grams per day  Call us if your weight increases by more than 2 pounds in 24 hours or 5 pounds in one week  Call us if you start to have chest pain or difficulty breathing when you exert yourself or chest tightness at night   For your back pain-Take baclofen 2.5 mg twice daily as needed    Fluid Restriction Some health conditions may require you to restrict your fluid intake. This means that you need to limit the amount of fluid you drink each day. When you have a fluid restriction, you must carefully measure and keep track of the amount of fluid you drink. Your health care provider will identify the specific amount of fluid you are allowed each day. This amount may depend on several things, such as:  The amount of urine you produce in a day.  How much fluid you are keeping (retaining) in your body.  Your blood pressure. WHAT IS MY PLAN? Your health care provider recommends that you limit your fluid intake to 2 liters per day. WHAT COUNTS TOWARD MY FLUID INTAKE? Your fluid intake includes all liquids that you drink, as well as any foods that become liquid at room temperature.  The following are examples of some fluids that you will have to restrict:  Tea, coffee, soda, lemonade, milk, water, juice, sport drinks, and nutritional supplement beverages.  Alcoholic beverages.  Cream.  Gravy.  Ice cubes.  Soup and broth. The following are examples of foods that become liquid at room temperature. These foods will also count toward your fluid intake.  Ice cream and ice milk.  Frozen yogurt and sherbet.  Frozen ice pops.  Flavored gelatin. HOW DO I KEEP TRACK OF MY FLUID INTAKE? Each morning, fill  a jug with the amount of water that equals the amount of fluid you are allowed for the day. You can use this water as a guideline for fluid allowance. Each time you take in any form of fluid, including ice cubes and foods that become liquid at room temperature, pour an equal amount of water out of the container. This helps you to see how much fluid you are taking in. It also helps you to see how much of your fluid intake is left for the rest of the day. The following conversions may also be helpful in measuring your fluid intake:  1 cup equals 8 oz (240 mL).   cup equals 6 oz (180 mL).   cup equals 5 oz (160 mL).   cup equals 4 oz (120 mL).   cup equals 2 oz (80 mL).   cup equals 2 oz (60 mL).  2 Tbsp equals 1 oz (30 mL). WHAT HOME CARE INSTRUCTIONS SHOULD I FOLLOW WHILE RESTRICTING FLUIDS?  Make sure that you stay within the recommended limit each day. Always measure and keep track of your fluids, as well as any foods that turn liquid at room temperature.  Use small cups and glasses and learn to sip fluids slowly.  Add a slice of fresh lemon or lemon juice to water or ice. This helps to satisfy your thirst.  Freeze fruit juice or water in an  ice cube tray. Use this as part of your fluid allowance. These cubes are useful for quenching your thirst. Measure the amount of liquid in each ice cube prior to freezing so you can subtract this amount from your day's allowance when you consume each frozen cube.  Try frozen fruits between meals, such as grapes or strawberries.  Swallow your pills along with meals or soft foods, such as applesauce or mashed potatoes. This helps you to save your fluid allowance for something that you enjoy.  Weigh yourself every day. Keeping track of your daily weight can help you and your health care provider to notice as soon as possible if you are retaining too much fluid in your body.  Weigh yourself every morning after you urinate but before you eat  breakfast.  Wear the same amount of clothing each time you weigh yourself.  Write down your daily weight. Give this weight record to your health care provider. If your weight is going up, you may be retaining too much fluid. Every 2 cups (480 mL) of fluid retained in the body becomes an extra 1 lb (0.45 kg) of body weight.  Avoid salty foods. These foods make you thirsty and make fluid control more difficult.  Brush your teeth often or rinse your mouth with mouthwash to help your dry mouth. Lemon wedges, hard sour candies, chewing gum, or breath spray may also help to moisten your mouth.  Keep the temperature in your home at a cooler level. Dry air increases thirst, so keep the air in your home as humid as possible.  Avoid being out in the hot sun, which can cause you to sweat and become thirsty. WHAT ARE SOME SIGNS THAT I MAY BE TAKING IN TOO MUCH FLUID? You may be taking in too much fluid if:  Your weight increases. Contact your health care provider if your weight increases 3 lb or more in a day or if it increases 5 lb or more in a week.  Your face, hands, legs, feet, and belly (abdomen) start to swell.  You have trouble breathing.   This information is not intended to replace advice given to you by your health care provider. Make sure you discuss any questions you have with your health care provider.   Document Released: 03/05/2007 Document Revised: 05/29/2014 Document Reviewed: 10/07/2013 Elsevier Interactive Patient Education [employer].

## 2016-04-03 NOTE — Assessment & Plan Note (Signed)
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 The pain is aching and is made worse with movement. He has not tried anything such as ice or medication to alleviate the pain. On exam he has a left lumbar paraspinal muscle spasm without CVA tenderness or abdominal pain.   Ordered baclofen 2.5 mg BID (renally dosed)  Follow up with PCP in 4 weeks

## 2016-04-04 ENCOUNTER — Other Ambulatory Visit: Payer: Self-pay | Admitting: Internal Medicine

## 2016-04-04 LAB — URINALYSIS, COMPLETE
Bilirubin, UA: NEGATIVE
Glucose, UA: NEGATIVE
Ketones, UA: NEGATIVE
Leukocytes, UA: NEGATIVE
NITRITE UA: NEGATIVE
PH UA: 5 (ref 5.0–7.5)
RBC, UA: NEGATIVE
Specific Gravity, UA: 1.011 (ref 1.005–1.030)
UUROB: 0.2 mg/dL (ref 0.2–1.0)

## 2016-04-04 LAB — MICROSCOPIC EXAMINATION
CASTS: NONE SEEN /LPF
EPITHELIAL CELLS (NON RENAL): NONE SEEN /HPF (ref 0–10)

## 2016-04-04 NOTE — Progress Notes (Signed)
I saw and evaluated the patient.  I personally confirmed the key portions of Dr. Blum's history and exam and reviewed pertinent patient test results.  The assessment, diagnosis, and plan were formulated together and I agree with the documentation in the resident's note. 

## 2016-04-05 NOTE — Congregational Nurse Program (Signed)
Congregational Nurse Program Note  Date of Encounter: 04/05/2016  Past Medical History: Past Medical History:  Diagnosis Date  . Barrett's esophagus   . CKD (chronic kidney disease) stage 4, GFR 15-29 ml/min (HCC)    Followed by [facility].  Thought to be due to long-standing HTN  . Edema   . Essential hypertension   . GERD (gastroesophageal reflux disease)   . Gout 05/04/2009   Qualifier: Diagnosis of  By: Dyann Kief MD, Matthias Hughs Details:  Home visit with interpreter [facility].  CN delivered Baclofen 10 mg tablets to patient and reviewed directions for taking.  Also reviewed 04/03/2016 after visit instructions regarding limiting fluid and salt intake. He showed [place] [employer] which he eats regularly and thought it did not have sodium because he is unable to read labels.  Instructed him to stick to fresh meats and vegetables and not add any products with sodium (i.e. Soy sauce, fish sauce or salt). He is eager to be compliant with recommendations but has difficulty due to language barriers.  [name] RN, Congregational Nurse [phone].     CNP Questionnaire - 04/05/16 1724      Patient Demographics   Is this a new or existing patient? Existing   Patient is considered a/an Refugee   Race Asian     Patient Assistance   Location of Patient Assistance Not Applicable   Patient's financial/insurance status Medicare   Uninsured Patient ([place] [facility]) No   Patient referred to apply for the following financial assistance Not Applicable   Food insecurities addressed Not Applicable   Transportation assistance No   Type of Assistance Other   Assistance securing medications No   Educational health offerings Medications;Nutrition     Encounter Details   Primary purpose of visit Chronic Illness/Condition Visit;Acute Illness/Condition Visit   Was an Emergency Department visit averted? Not Applicable   Does patient have a medical provider?  Yes   Patient referred to Not Applicable   Was a mental health screening completed? (GAINS tool) No   Does patient have dental issues? No   Was a dental referral made? No   Does patient have vision issues? No   Does your patient have an abnormal blood pressure today? No   Since previous encounter, have you referred patient for abnormal blood pressure that resulted in a new diagnosis or medication change? No   Does your patient have an abnormal blood glucose today? No   Since previous encounter, have you referred patient for abnormal blood glucose that resulted in a new diagnosis or medication change? No   Was there a life-saving intervention made? No     For Abstraction Use Only   Was the patient insured? Yes         Amb Nursing Assessment - 04/03/16 1043      Pre-visit preparation   Pre-visit preparation completed Yes     Pain Assessment   Pain Assessment 0-10   Pain Score 4    Pain Type Chronic pain   Pain Location Hip   Pain Orientation Left   Pain Radiating Towards stays in hips   Pain Descriptors / Indicators Aching   Pain Onset More than a month ago   Pain Frequency Intermittent   Pain Relieving Factors not doing anything    Effect of Pain on Daily Activities tolerates      Nutrition Screen   BMI - recorded 36.63   Nutritional Status BMI > 30  Obese   Nutritional Risks None   Diabetes No     Functional Status   Activities of Daily Living Independent   Ambulation Independent   Medication Administration Independent   Home Management Independent     Risk/Barriers  Assessment   Barriers to Care Management & Learning Language  [place] interpreter      Abuse/Neglect Assessment   Do you feel unsafe in your current relationship? No   Do you feel physically threatened by others? No   Anyone hurting you at home, work, or school? No   Unable to ask? No   Information provided on Community resources No     Patient Literacy   How often do you need to have someone  help you when you read instructions, pamphlets, or other written materials from your doctor or pharmacy? 5 - Always   What is the last grade level you completed in school? None      Investment banker, operational Needed? Yes   [place]    Interpreter Name Modena Nunnery    Patient Declined Interpreter  No   Patient signed [facility] waiver No     Comments   Information entered by : Sander Nephew, RN 04/03/2016 10:548 AM

## 2016-04-16 NOTE — Assessment & Plan Note (Deleted)
BP Readings from Last 3 Encounters:  04/03/16 131/71  03/15/16 (!) 152/88  03/08/16 (!) 150/78   Lab Results  Component Value Date   CREATININE 4.20 (H) 10/06/2015   Lab Results  Component Value Date   K 4.5 10/06/2015    Current medications: amlodipine 10 mg daily, lasix 40 mg BID  Assessment BP goal: <140/90 BP control:  Plan Medications: continue current meds Other: discussed diet and exercise

## 2016-04-16 NOTE — Progress Notes (Deleted)
   CC: ***  HPI:  [name] is a 65 y.o. man with history of HTN, CKD, gout, and anemia who presents for management of hypertension.  Please see A&P for status of the patient's chronic medical conditions.  History obtained with in-person interpreter.  Collateral from Luke Michaelis, RN, Scientist, research (physical sciences).***  He was last seem in clinic on 04/03/2016 for back pain and also reported several weeks of worsening leg swelling, as well as one nocturnal awakening with dyspnea and transient chest pain.  Echocardiogram was ordered and has not yet been scheduled.  Past Medical History:  Diagnosis Date  . Barrett's esophagus   . CKD (chronic kidney disease) stage 4, GFR 15-29 ml/min (HCC)    Followed by [facility].  Thought to be due to long-standing HTN  . Edema   . Essential hypertension   . GERD (gastroesophageal reflux disease)   . Gout 05/04/2009   Qualifier: Diagnosis of  By: Dyann Kief MD, Clifton James      Review of Systems:   ROS  Physical Exam:  There were no vitals filed for this visit.   Physical Exam  CBC and BMP from 12/14/2015 by [facility] Results include Hgb 10.6  Glucose 122 Creatinine 3.65 K 3.6 Phosphate 2.0   Assessment & Plan:   See Encounters Tab for problem based charting.  Patient seen with Dr. Marland Kitchen

## 2016-04-16 NOTE — Assessment & Plan Note (Deleted)
Seen 04/03/2016 for left lower back pain follow lifting.  Prescribed baclofen 2.5 mg BID

## 2016-04-16 NOTE — Assessment & Plan Note (Deleted)
    Current medications: febuxostat 80 mg daily  -Continue current meds

## 2016-04-16 NOTE — Assessment & Plan Note (Deleted)
Lower extremity swelling   PFTs 12/2015 FVC 64% FEV1 64% FEV1/FVC 99% TLC 68% DLCO 65% "Conclusions: Moderate airway obstruction is present. The diffusion defect and reduced lung volumes suggest an early parenchymal process."  -Echocardiogram

## 2016-04-17 ENCOUNTER — Encounter: Payer: Medicare Other | Admitting: Internal Medicine

## 2016-04-19 NOTE — Congregational Nurse Program (Signed)
Congregational Nurse Program Note  Date of Encounter: 04/19/2016  Past Medical History: Past Medical History:  Diagnosis Date  . Barrett's esophagus   . CKD (chronic kidney disease) stage 4, GFR 15-29 ml/min (HCC)    Followed by [facility].  Thought to be due to long-standing HTN  . Edema   . Essential hypertension   . GERD (gastroesophageal reflux disease)   . Gout 05/04/2009   Qualifier: Diagnosis of  By: Dyann Kief MD, Matthias Hughs Details:  Home visit with interpreter [facility].  No edema in feet, ankles or legs.  States his back still bothers him especially at night even with taking Baclofen but he does not want to go back to doctor.  Also says he stopped all of his daily meds for the past week because once he added the baclofen he developed nausea and vomiting.  Instructed him to resume all medicine and to try taking the baclofen before he goes to bed rather than with his other medicine.Pill boxes filled for 2 weeks. BP 150/82.  Will follow-up with patient in 2 weeks.  [name] RN, Congregational Nurse [phone]     CNP Questionnaire - 04/19/16 1651      Patient Demographics   Is this a new or existing patient? Existing   Patient is considered a/an Refugee   Race Asian     Patient Assistance   Location of Patient Assistance Not Applicable   Patient's financial/insurance status Medicare;Medicaid   Uninsured Patient ([place] Oncologist) No   Patient referred to apply for the following financial assistance Not Applicable   Food insecurities addressed Not Applicable   Transportation assistance No   Type of Assistance Other   Assistance securing medications No   Educational health offerings Medications     Encounter Details   Primary purpose of visit Chronic Illness/Condition Visit;Acute Illness/Condition Visit   Was an Emergency Department visit averted? Not Applicable   Does patient have a medical provider? Yes   Patient referred to Not  Applicable   Was a mental health screening completed? (GAINS tool) No   Does patient have dental issues? No   Was a dental referral made? No   Does patient have vision issues? No   Does your patient have an abnormal blood pressure today? No   Since previous encounter, have you referred patient for abnormal blood pressure that resulted in a new diagnosis or medication change? No   Does your patient have an abnormal blood glucose today? No   Since previous encounter, have you referred patient for abnormal blood glucose that resulted in a new diagnosis or medication change? No   Was there a life-saving intervention made? No         Amb Nursing Assessment - 04/03/16 1043      Pre-visit preparation   Pre-visit preparation completed Yes     Pain Assessment   Pain Assessment 0-10   Pain Score 4    Pain Type Chronic pain   Pain Location Hip   Pain Orientation Left   Pain Radiating Towards stays in hips   Pain Descriptors / Indicators Aching   Pain Onset More than a month ago   Pain Frequency Intermittent   Pain Relieving Factors not doing anything    Effect of Pain on Daily Activities tolerates      Nutrition Screen   BMI - recorded 36.63   Nutritional Status BMI > 30  Obese   Nutritional Risks None  Diabetes No     Functional Status   Activities of Daily Living Independent   Ambulation Independent   Medication Administration Independent   Home Management Independent     Risk/Barriers  Assessment   Barriers to Care Management & Learning Language  [place] interpreter      Abuse/Neglect Assessment   Do you feel unsafe in your current relationship? No   Do you feel physically threatened by others? No   Anyone hurting you at home, work, or school? No   Unable to ask? No   Information provided on Community resources No     Patient Literacy   How often do you need to have someone help you when you read instructions, pamphlets, or other written materials from your doctor or  pharmacy? 5 - Always   What is the last grade level you completed in school? None      Investment banker, operational Needed? Yes   [place]    Interpreter Name Modena Nunnery    Patient Declined Interpreter  No   Patient signed [facility] waiver No     Comments   Information entered by : Sander Nephew, RN 04/03/2016 10:548 AM

## 2016-04-26 ENCOUNTER — Telehealth: Payer: Self-pay | Admitting: Internal Medicine

## 2016-04-26 NOTE — Telephone Encounter (Signed)
Called # found in chart and lm for rtc

## 2016-04-26 NOTE — Telephone Encounter (Signed)
L/m for Luke Dunn

## 2016-04-26 NOTE — Telephone Encounter (Signed)
Sent to front desk pool in error.  Forwarding to triage pool to call patient with results.

## 2016-04-26 NOTE — Telephone Encounter (Signed)
Urinalysis was checked for workup of edema and revealed new proteinuria. He will need further workup to quantify the degree of proteinuria. Will ask triage to call and notify congregational nurse and schedule follow up in [facility].

## 2016-04-27 NOTE — Telephone Encounter (Signed)
Talked to Luke Dunn,congregational nurse - explained reason why pt needs to be seen in [facility] per Dr Hetty Ely. Stated she will talk w/pt and explain reason and arrange for an interpreter. And will call back to schedule an appt ; stated might be  Wed of next week but will try to make it sooner.

## 2016-04-30 NOTE — Telephone Encounter (Signed)
Looking over Luke Dunn chart I realized he is due for follow up with Dr. Inda Castle. It appears that he has not yet scheduled the [facility] visit to follow up on proteinuria at this time. When they are ready to schedule follow up I would say he should actually have a continuity visit scheduled if possible because Dr. Inda Castle is familiar with his history of chronic kidney disease. I will message Dr. Inda Castle with the results of recent urinalysis.   Thank you

## 2016-05-02 NOTE — Telephone Encounter (Signed)
Left message for Carolyn,congregational nurse to call me back; to see if she had talked to pt about coming in for an appt.

## 2016-05-03 NOTE — Congregational Nurse Program (Signed)
Congregational Nurse Program Note  Date of Encounter: 05/03/2016  Past Medical History: Past Medical History:  Diagnosis Date  . Barrett's esophagus   . CKD (chronic kidney disease) stage 4, GFR 15-29 ml/min (HCC)    Followed by [facility].  Thought to be due to long-standing HTN  . Edema   . Essential hypertension   . GERD (gastroesophageal reflux disease)   . Gout 05/04/2009   Qualifier: Diagnosis of  By: Dyann Kief MD, Matthias Hughs Details: Home visit with interpreter [facility].  Filled pill box for 2 weeks and called in refills for Uloric and Pantoprazole. Explained to him that his urinalysis was abnormal for elevated protein on last visit and he needs to follow-up for further evaluation.  He agreed and CN scheduled appointment at [facility] clinic for 05/05/2016 at 10:45 am.  [name] RN, Congregational Nurse [phone].     CNP Questionnaire - 05/03/16 1828      Patient Demographics   Is this a new or existing patient? Existing   Patient is considered a/an Refugee   Race Asian     Patient Assistance   Location of Patient Assistance Not Applicable   Patient's financial/insurance status Medicare;Medicaid   Uninsured Patient ([place] Oncologist) No   Patient referred to apply for the following financial assistance Not Applicable   Food insecurities addressed Not Applicable   Transportation assistance No   Assistance securing medications No   Educational health offerings Other     Encounter Details   Primary purpose of visit Chronic Illness/Condition Visit;Acute Illness/Condition Visit   Was an Emergency Department visit averted? Not Applicable   Does patient have a medical provider? Yes   Patient referred to Follow up with established PCP   Was a mental health screening completed? (GAINS tool) No   Does patient have dental issues? No   Was a dental referral made? No   Does patient have vision issues? No   Does your patient  have an abnormal blood pressure today? No   Since previous encounter, have you referred patient for abnormal blood pressure that resulted in a new diagnosis or medication change? No   Does your patient have an abnormal blood glucose today? No   Since previous encounter, have you referred patient for abnormal blood glucose that resulted in a new diagnosis or medication change? No   Was there a life-saving intervention made? No

## 2016-05-04 ENCOUNTER — Telehealth: Payer: Self-pay | Admitting: Internal Medicine

## 2016-05-04 NOTE — Telephone Encounter (Signed)
APT. REMINDER CALL, LMTCB °

## 2016-05-05 ENCOUNTER — Encounter: Payer: Self-pay | Admitting: Internal Medicine

## 2016-05-05 ENCOUNTER — Ambulatory Visit (INDEPENDENT_AMBULATORY_CARE_PROVIDER_SITE_OTHER): Payer: Medicare Other | Admitting: Internal Medicine

## 2016-05-05 ENCOUNTER — Encounter (INDEPENDENT_AMBULATORY_CARE_PROVIDER_SITE_OTHER): Payer: Self-pay

## 2016-05-05 VITALS — BP 143/73 | HR 78 | Temp 97.5°F | Wt 211.3 lb

## 2016-05-05 DIAGNOSIS — Z87891 Personal history of nicotine dependence: Secondary | ICD-10-CM

## 2016-05-05 DIAGNOSIS — Z79899 Other long term (current) drug therapy: Secondary | ICD-10-CM

## 2016-05-05 DIAGNOSIS — N184 Chronic kidney disease, stage 4 (severe): Secondary | ICD-10-CM

## 2016-05-05 DIAGNOSIS — I129 Hypertensive chronic kidney disease with stage 1 through stage 4 chronic kidney disease, or unspecified chronic kidney disease: Secondary | ICD-10-CM

## 2016-05-05 DIAGNOSIS — I10 Essential (primary) hypertension: Secondary | ICD-10-CM

## 2016-05-05 MED ORDER — DILTIAZEM HCL ER 60 MG PO CP12: 60.0000 mg | ORAL_CAPSULE | Freq: Two times a day (BID) | ORAL | 2 refills | Status: DC

## 2016-05-05 NOTE — Assessment & Plan Note (Signed)
Patient follows with [facility], last seen by Dr. Justin Mend in July 2017 at which time his GFR was 16 and creatinine was 3.65. He has an AVF placed at his left arm in anticipation for future need of dialysis which has not yet needed to be initiated. He was seen in our clinic on 04/03/16 and noted to have mild volume overload with edema of both legs and left arm with associated shortness of breath. This was thought related to his CKD, but cardiac dysfunction was also considered and an Echo was ordered to evaluate cardiac function (not yet completed). UA on last visit showed 3+ protein. He takes Lasix 40 mg BID and reports adherence to his meds for the last 2 weeks with assistance from his congregational nurse. He says his shortness of breath has resolved as well as his lower extremity edema. He reports good urine output. He denies any orthopnea or PND.  Repeat urine dipstick today shows protein 100 mg/dL. Will check spot protein/creatinine. Not on ACE-I or ARB due to elevated creatinine. Will switch Amlodipine to a non-dihydropyridine CCB for antiproteinuric effects. I will send a message to update his nephrologist, Dr. Justin Mend, who he will follow up with on 06/13/16. -Stop Amlodipine -Start Diltiazem 60 mg BID -f/u spot protein/creatinine -Congregational nurse will recheck BP and HR in 1 week and update [place] with results -Patient counseled on fluid/salt restriction

## 2016-05-05 NOTE — Progress Notes (Signed)
   CC: CKD  HPI:  [name] is a 65 y.o. [place] Luke Dunn speaking male with PMH as listed below who presents for follow up management of his CKD and HTN. He is accompanied by an interpretor and his congregational nurse, Luke Dunn, who provides additional history.  CKD IV-V: Patient follows with [facility], last seen by Dr. Justin Dunn in July 2017 at which time his GFR was 16 and creatinine was 3.65. He has an AVF placed at his left arm in anticipation for future need of dialysis which has not yet needed to be initiated. He was seen in our clinic on 04/03/16 and noted to have mild volume overload with edema of both legs and left arm with associated shortness of breath. This was thought related to his CKD, but cardiac dysfunction was also considered and an Echo was ordered to evaluate cardiac function (not yet completed). UA on last visit showed 3+ protein. He takes Lasix 40 mg BID and reports adherence to his meds for the last 2 weeks with assistance from his congregational nurse. He says his shortness of breath has resolved as well as his lower extremity edema. He reports good urine output. He denies any orthopnea or PND.  HTN: He currently takes Amlodipine 10 mg daily and Lasix 40 mg BID. BP today is 149/46.  Past Medical History:  Diagnosis Date  . Barrett's esophagus   . CKD (chronic kidney disease) stage 4, GFR 15-29 ml/min (HCC)    Followed by [facility].  Thought to be due to long-standing HTN  . Edema   . Essential hypertension   . GERD (gastroesophageal reflux disease)   . Gout 05/04/2009   Qualifier: Diagnosis of  By: Dyann Kief MD, Luke Dunn      Review of Systems:   Review of Systems  Respiratory: Negative for shortness of breath.   Cardiovascular: Negative for chest pain, orthopnea, leg swelling and PND.  Gastrointestinal: Negative for abdominal pain.  Genitourinary: Negative for dysuria.  Skin: Positive for itching.     Physical Exam:  Vitals:   05/05/16 1103  05/05/16 1215  BP: (!) 149/46 (!) 143/73  Pulse: 78   Temp: 97.5 F (36.4 C)   TempSrc: Oral   SpO2: 100%   Weight: 211 lb 4.8 oz (95.8 kg)    Physical Exam  Constitutional: He appears well-developed and well-nourished. No distress.  HENT:  Multiple missing teeth  Cardiovascular: Normal rate and regular rhythm.   No murmur heard. AVF LUE with palpable thrill  Pulmonary/Chest: Effort normal. No respiratory distress. He has no wheezes.  Minimal bibasilar crackles  Abdominal: Soft. Bowel sounds are normal. There is no tenderness.  Musculoskeletal: He exhibits no tenderness.  +1 pitting edema bilateral lower extremities  Neurological: He is alert.  Skin: Skin is warm. He is not diaphoretic.    Assessment & Plan:   See Encounters Tab for problem based charting.  Patient discussed with Dr. Daryll Dunn

## 2016-05-05 NOTE — Patient Instructions (Signed)
It was a pleasure to meet you Luke Dunn.  You have protein in your urine from your kidney disease.  I will change your blood pressure medicine to help slow down the amount of protein you are losing.  Stop the Amlodipine.  Start Diltiazem 60 mg twice a day.  Check your Blood Pressure and Heart Rate in 1 week.  Continue your other medicines as prescribed.  Please limit your salt to less than 2 grams per day.  Try to check your weights at home.  Try to schedule the Echocardiogram that was ordered on last visit to check your heart function.  Please follow up with Dr. Justin Mend and Dr. Inda Castle as scheduled.

## 2016-05-05 NOTE — Congregational Nurse Program (Signed)
Congregational Nurse Program Note  Date of Encounter: 05/05/2016  Past Medical History: Past Medical History:  Diagnosis Date  . Barrett's esophagus   . CKD (chronic kidney disease) stage 4, GFR 15-29 ml/min (HCC)    Followed by [facility].  Thought to be due to long-standing HTN  . Edema   . Essential hypertension   . GERD (gastroesophageal reflux disease)   . Gout 05/04/2009   Qualifier: Diagnosis of  By: Dyann Kief MD, Matthias Hughs Details:  CN accompanied patient to [facility] to follow up on elevated protein in urine.  Patient states he is feeling good today.  Urinalysis still showed protein in urine therefore BP medicine changed from Amlodipine to Diltiazem.  Phone call from [facility] stating they will have to order Diltiazem and it will be available for pickup after 3:00 pm Monday 05/08/2016.  CN called patient to advise him she will not be bringing new medicine today as planned.  CN will obtain digital scales for patient and OTC medication for itching on abdomen.  [name] RN, Congregational Nurse [phone]     CNP Questionnaire - 05/05/16 1553      Patient Demographics   Is this a new or existing patient? Existing   Patient is considered a/an Refugee   Race Asian     Patient Assistance   Location of Patient Assistance Not Applicable   Patient's financial/insurance status Medicare;Medicaid   Uninsured Patient ([place] Card/Care Connects) No   Patient referred to apply for the following financial assistance Not Applicable   Food insecurities addressed Not Applicable   Transportation assistance No   Type of Assistance Other   Assistance securing medications No   Type of Production manager;Other   Educational health offerings Medications     Encounter Details   Primary purpose of visit Chronic Illness/Condition Visit   Was an Emergency Department visit averted? Not Applicable   Does patient have a medical provider? Yes   Patient referred to Not Applicable   Was a mental health screening completed? (GAINS tool) No   Does patient have dental issues? No   Was a dental referral made? No   Does patient have vision issues? No   Does your patient have an abnormal blood pressure today? No   Since previous encounter, have you referred patient for abnormal blood pressure that resulted in a new diagnosis or medication change? No   Does your patient have an abnormal blood glucose today? No   Since previous encounter, have you referred patient for abnormal blood glucose that resulted in a new diagnosis or medication change? No   Was there a life-saving intervention made? No         Amb Nursing Assessment - 05/05/16 1105      Pre-visit preparation   Pre-visit preparation completed No     Pain Assessment   Pain Assessment No/denies pain   Pain Score 0-No pain     Nutrition Screen   BMI - recorded 37.43   Nutritional Risks None   Diabetes No     Functional Status   Activities of Daily Living Independent   Ambulation Independent   Medication Administration Independent   Home Management Independent     Risk/Barriers  Assessment   Barriers to Care Management & Learning Language     Abuse/Neglect Assessment   Do you feel unsafe in your current relationship? No   Do you feel physically threatened by others? No   Anyone  hurting you at home, work, or school? No   Unable to ask? No     Patient Literacy   How often do you need to have someone help you when you read instructions, pamphlets, or other written materials from your doctor or pharmacy? 5 - Always   What is the last grade level you completed in school? no schooling     Investment banker, operational Needed? Yes   Furniture conservator/restorer   Interpreter Name [place]   Patient Declined Interpreter  No   Patient signed [place] waiver Yes     Comments   Information entered by : Jeremy Johann RN BSN 05/05/2016 [phone]

## 2016-05-05 NOTE — Assessment & Plan Note (Addendum)
BP Readings from Last 3 Encounters:  05/05/16 (!) 143/73  04/19/16 (!) 150/82  04/03/16 131/71   He currently takes Amlodipine 10 mg daily and Lasix 40 mg BID. BP today is 149/46. Repeat BP is 143/73.  Blood pressure is above goal. Will change Amlodipine to Diltiazem for antiproteinuric effects and titrate up as needed. -stop Amlodipine -Start Diltiazem 60 mg BID -repeat BP and HR in 1 week

## 2016-05-05 NOTE — Telephone Encounter (Signed)
Pt has an appt today in [facility] per [facility].

## 2016-05-06 LAB — PROTEIN / CREATININE RATIO, URINE
CREATININE, UR: 44.1 mg/dL
Protein, Ur: 162.4 mg/dL
Protein/Creat Ratio: 3683 mg/g creat — ABNORMAL HIGH (ref 0–200)

## 2016-05-08 NOTE — Progress Notes (Signed)
Internal Medicine Clinic Attending  Case discussed with Dr. Patel,Vishal soon after the resident saw the patient.  We reviewed the resident's history and exam and pertinent patient test results.  I agree with the assessment, diagnosis, and plan of care documented in the resident's note. 

## 2016-05-09 NOTE — Congregational Nurse Program (Signed)
Congregational Nurse Program Note  Date of Encounter: 05/09/2016  Past Medical History: Past Medical History:  Diagnosis Date  . Barrett's esophagus   . CKD (chronic kidney disease) stage 4, GFR 15-29 ml/min (HCC)    Followed by [facility].  Thought to be due to long-standing HTN  . Edema   . Essential hypertension   . GERD (gastroesophageal reflux disease)   . Gout 05/04/2009   Qualifier: Diagnosis of  By: Dyann Kief MD, Matthias Hughs Details: CN picked up Diltiazem, Pantoprazole, and Uloric from [employer] and took it to patient (home visit).  Removed Amlodipine from pill boxes and replaced it with Diltiazem 60 mg B.I.D.  Interpreter [name] explained medication change to patient. CN also provided a digital scale to patient and showed him how to use and record his weights.  Advised him he has appointment on 05/19/2016 at 1:00 pm for Echocardiogram at [place], [place] Nurse [phone]     CNP Questionnaire - 05/09/16 1921      Patient Demographics   Is this a new or existing patient? Existing   Patient is considered a/an Refugee   Race Asian     Patient Assistance   Location of Patient Assistance Not Applicable   Patient's financial/insurance status Medicare;Medicaid   Uninsured Patient ([place] Oncologist) No   Patient referred to apply for the following financial assistance Not Applicable   Food insecurities addressed Not Applicable   Transportation assistance No   Type of Assistance Other   Assistance securing medications Yes   Type of Production manager;Other   Educational health offerings Medications     Encounter Details   Primary purpose of visit Chronic Illness/Condition Visit   Was an Emergency Department visit averted? Not Applicable   Does patient have a medical provider? Yes   Patient referred to Not Applicable   Was a mental health screening completed? (GAINS tool) No   Does patient  have dental issues? No   Was a dental referral made? No   Does patient have vision issues? No   Does your patient have an abnormal blood pressure today? No   Since previous encounter, have you referred patient for abnormal blood pressure that resulted in a new diagnosis or medication change? No   Does your patient have an abnormal blood glucose today? No   Since previous encounter, have you referred patient for abnormal blood glucose that resulted in a new diagnosis or medication change? No   Was there a life-saving intervention made? No         Amb Nursing Assessment - 05/05/16 1105      Pre-visit preparation   Pre-visit preparation completed No     Pain Assessment   Pain Assessment No/denies pain   Pain Score 0-No pain     Nutrition Screen   BMI - recorded 37.43   Nutritional Risks None   Diabetes No     Functional Status   Activities of Daily Living Independent   Ambulation Independent   Medication Administration Independent   Home Management Independent     Risk/Barriers  Assessment   Barriers to Care Management & Learning Language     Abuse/Neglect Assessment   Do you feel unsafe in your current relationship? No   Do you feel physically threatened by others? No   Anyone hurting you at home, work, or school? No   Unable to ask? No     Patient Literacy  How often do you need to have someone help you when you read instructions, pamphlets, or other written materials from your doctor or pharmacy? 5 - Always   What is the last grade level you completed in school? no schooling     Investment banker, operational Needed? Yes   Furniture conservator/restorer   Interpreter Name [place]   Patient Declined Interpreter  No   Patient signed [place] waiver Yes     Comments   Information entered by : Jeremy Johann RN BSN 05/05/2016 [phone]

## 2016-05-19 ENCOUNTER — Ambulatory Visit (HOSPITAL_COMMUNITY)
Admission: RE | Admit: 2016-05-19 | Discharge: 2016-05-19 | Disposition: A | Discharge status: Home / Self Care | Payer: Medicare Other | Source: Ambulatory Visit | Attending: Internal Medicine | Admitting: Internal Medicine

## 2016-05-19 DIAGNOSIS — I340 Nonrheumatic mitral (valve) insufficiency: Secondary | ICD-10-CM | POA: Insufficient documentation

## 2016-05-19 DIAGNOSIS — I313 Pericardial effusion (noninflammatory): Secondary | ICD-10-CM | POA: Diagnosis not present

## 2016-05-19 DIAGNOSIS — E8770 Fluid overload, unspecified: Secondary | ICD-10-CM | POA: Diagnosis not present

## 2016-05-19 NOTE — Congregational Nurse Program (Signed)
Congregational Nurse Program Note  Date of Encounter: 05/19/2016  Past Medical History: Past Medical History:  Diagnosis Date  . Barrett's esophagus   . CKD (chronic kidney disease) stage 4, GFR 15-29 ml/min (HCC)    Followed by [facility].  Thought to be due to long-standing HTN  . Edema   . Essential hypertension   . GERD (gastroesophageal reflux disease)   . Gout 05/04/2009   Qualifier: Diagnosis of  By: Dyann Kief MD, Matthias Hughs Details:  Home visit.  Filled pill boxes for 2 weeks.  BP 144/80.  Pulse 80.  Accompanied patient to [facility] for echocardiogram.  [name] RN, Congregational Nurse [phone]     CNP Questionnaire - 05/19/16 1910      Patient Demographics   Is this a new or existing patient? Existing   Patient is considered a/an Refugee   Race Asian     Patient Assistance   Location of Patient Assistance Not Applicable   Patient's financial/insurance status Medicare;Medicaid   Uninsured Patient ([facility]) No   Patient referred to apply for the following financial assistance Not Applicable   Food insecurities addressed Not Applicable   Transportation assistance No   Assistance securing medications No   Educational health offerings Medications;Hypertension;Navigating the healthcare system     Encounter Details   Primary purpose of visit Chronic Illness/Condition Visit;Navigating the Healthcare System   Was an Emergency Department visit averted? Not Applicable   Does patient have a medical provider? Yes   Patient referred to Not Applicable   Was a mental health screening completed? (GAINS tool) No   Does patient have dental issues? No   Was a dental referral made? No   Does patient have vision issues? No   Does your patient have an abnormal blood pressure today? Yes   Since previous encounter, have you referred patient for abnormal blood pressure that resulted in a new diagnosis or medication change?  No   Does your patient have an abnormal blood glucose today? No   Since previous encounter, have you referred patient for abnormal blood glucose that resulted in a new diagnosis or medication change? No   Was there a life-saving intervention made? No         Amb Nursing Assessment - 05/05/16 1105      Pre-visit preparation   Pre-visit preparation completed No     Pain Assessment   Pain Assessment No/denies pain   Pain Score 0-No pain     Nutrition Screen   BMI - recorded 37.43   Nutritional Risks None   Diabetes No     Functional Status   Activities of Daily Living Independent   Ambulation Independent   Medication Administration Independent   Home Management Independent     Risk/Barriers  Assessment   Barriers to Care Management & Learning Language     Abuse/Neglect Assessment   Do you feel unsafe in your current relationship? No   Do you feel physically threatened by others? No   Anyone hurting you at home, work, or school? No   Unable to ask? No     Patient Literacy   How often do you need to have someone help you when you read instructions, pamphlets, or other written materials from your doctor or pharmacy? 5 - Always   What is the last grade level you completed in school? no schooling     Investment banker, operational Needed? Yes  Industrial/product designer Language Resource   Interpreter Name Lennox   Patient Declined Interpreter  No   Patient signed [place] waiver Yes     Comments   Information entered by : Jeremy Johann RN BSN 05/05/2016 [phone]

## 2016-05-19 NOTE — Progress Notes (Signed)
  Echocardiogram 2D Echocardiogram has been performed.  Luke Dunn 05/19/2016, 1:58 PM

## 2016-06-08 ENCOUNTER — Other Ambulatory Visit: Payer: Self-pay | Admitting: Internal Medicine

## 2016-06-10 NOTE — Congregational Nurse Program (Signed)
Congregational Nurse Program Note  Date of Encounter: 06/09/2016  Past Medical History: Past Medical History:  Diagnosis Date  . Barrett's esophagus   . CKD (chronic kidney disease) stage 4, GFR 15-29 ml/min (HCC)    Followed by [facility].  Thought to be due to long-standing HTN  . Edema   . Essential hypertension   . GERD (gastroesophageal reflux disease)   . Gout 05/04/2009   Qualifier: Diagnosis of  By: Dyann Kief MD, Matthias Hughs Details:  Home visit.  CN picked up medications at [facility] and filled pill boxes for 2 weeks.  Reminded patient of appointment on Tuesday 06/13/2016 at 1:30 with Dr. Edrick Oh at [facility].  BP today 148/90.  No edema in feet, ankles or hands.  States he is feeling good.  [name] RN, Congregational Nurse [phone]      CNP Questionnaire - 06/09/16 1316      Patient Demographics   Is this a new or existing patient? Existing   Patient is considered a/an Refugee   Race Asian     Patient Assistance   Location of Patient Assistance Not Applicable   Patient's financial/insurance status Medicare;Medicaid   Uninsured Patient ([facility]) No   Patient referred to apply for the following financial assistance Not Applicable   Food insecurities addressed Not Applicable   Transportation assistance No   Type of Assistance Other   Assistance securing medications Yes   Type of Production manager;Other   Educational health offerings Medications;Hypertension     Encounter Details   Primary purpose of visit Chronic Illness/Condition Visit;Navigating the Healthcare System   Was an Emergency Department visit averted? Not Applicable   Does patient have a medical provider? Yes   Patient referred to Not Applicable   Was a mental health screening completed? (GAINS tool) No   Does patient have dental issues? No   Was a dental referral made? No   Does patient have vision issues? No   Does your patient  have an abnormal blood pressure today? Yes   Since previous encounter, have you referred patient for abnormal blood pressure that resulted in a new diagnosis or medication change? No   Does your patient have an abnormal blood glucose today? No   Since previous encounter, have you referred patient for abnormal blood glucose that resulted in a new diagnosis or medication change? No   Was there a life-saving intervention made? No

## 2016-06-13 DIAGNOSIS — N2581 Secondary hyperparathyroidism of renal origin: Secondary | ICD-10-CM | POA: Diagnosis not present

## 2016-06-13 DIAGNOSIS — N184 Chronic kidney disease, stage 4 (severe): Secondary | ICD-10-CM | POA: Diagnosis not present

## 2016-06-13 DIAGNOSIS — D509 Iron deficiency anemia, unspecified: Secondary | ICD-10-CM | POA: Diagnosis not present

## 2016-06-13 DIAGNOSIS — D631 Anemia in chronic kidney disease: Secondary | ICD-10-CM | POA: Diagnosis not present

## 2016-06-13 NOTE — Congregational Nurse Program (Signed)
Congregational Nurse Program Note  Date of Encounter: 06/13/2016  Past Medical History: Past Medical History:  Diagnosis Date  . Barrett's esophagus   . CKD (chronic kidney disease) stage 4, GFR 15-29 ml/min (HCC)    Followed by [facility].  Thought to be due to long-standing HTN  . Edema   . Essential hypertension   . GERD (gastroesophageal reflux disease)   . Gout 05/04/2009   Qualifier: Diagnosis of  By: Dyann Kief MD, Matthias Hughs Details:  CN and interpreter [name] accompanied patient to 6 month checkup with Dr. Justin Mend at [facility].  His B/P was normal and his weight good.  No edema.  His only complaint was itching/ rash on abdomen. States OTC creams did not help.  Dr. Justin Mend does not think itching is related to his kidneys and advised him to discuss it again with his PCP at 06/26/2016 visit.  Blood specimen obtained for labwork. Return in 6 months.  [name] RN, Congregational Nurse.  [phone]     CNP Questionnaire - 06/13/16 1726      Patient Demographics   Is this a new or existing patient? Existing   Patient is considered a/an Refugee   Race Asian     Patient Assistance   Location of Patient Assistance Not Applicable   Patient's financial/insurance status Medicare;Medicaid   Uninsured Patient ([facility]) No   Patient referred to apply for the following financial assistance Not Applicable   Food insecurities addressed Not Applicable   Transportation assistance No   Assistance securing medications No   Educational health offerings Nutrition     Encounter Details   Primary purpose of visit Chronic Illness/Condition Visit;Navigating the Healthcare System   Was an Emergency Department visit averted? Not Applicable   Does patient have a medical provider? Yes   Patient referred to Not Applicable   Was a mental health screening completed? (GAINS tool) No   Does patient have dental issues? No   Was a dental referral made? No   Does patient have vision issues? No   Does your patient have an abnormal blood pressure today? No   Since previous encounter, have you referred patient for abnormal blood pressure that resulted in a new diagnosis or medication change? No   Does your patient have an abnormal blood glucose today? No   Since previous encounter, have you referred patient for abnormal blood glucose that resulted in a new diagnosis or medication change? No   Was there a life-saving intervention made? No

## 2016-06-21 NOTE — Congregational Nurse Program (Signed)
Congregational Nurse Program Note  Date of Encounter: 06/21/2016  Past Medical History: Past Medical History:  Diagnosis Date  . Barrett's esophagus   . CKD (chronic kidney disease) stage 4, GFR 15-29 ml/min (HCC)    Followed by [facility].  Thought to be due to long-standing HTN  . Edema   . Essential hypertension   . GERD (gastroesophageal reflux disease)   . Gout 05/04/2009   Qualifier: Diagnosis of  By: Dyann Kief MD, Matthias Hughs Details:  CN picked up Sodium Bicarbonate prescription for patient at [facility].  [name] RN, Congregational Nurse [phone]     CNP Questionnaire - 06/21/16 1531      Patient Demographics   Is this a new or existing patient? Existing   Patient is considered a/an Refugee   Race Asian     Patient Assistance   Location of Patient Assistance Not Applicable   Patient's financial/insurance status Medicare;Medicaid   Uninsured Patient ([facility]) No   Patient referred to apply for the following financial assistance Not Applicable   Food insecurities addressed Not Applicable   Transportation assistance No   Type of Assistance Other   Assistance securing medications Yes   Type of Production manager;Other   Educational health offerings Not Applicable     Encounter Details   Primary purpose of visit Chronic Illness/Condition Visit   Was an Emergency Department visit averted? Not Applicable   Does patient have a medical provider? Yes   Patient referred to Not Applicable   Was a mental health screening completed? (GAINS tool) No   Does patient have dental issues? No   Was a dental referral made? No   Does patient have vision issues? No   Does your patient have an abnormal blood pressure today? No   Since previous encounter, have you referred patient for abnormal blood pressure that resulted in a new diagnosis or medication change? No   Does your patient have an abnormal blood glucose today? No    Since previous encounter, have you referred patient for abnormal blood glucose that resulted in a new diagnosis or medication change? No   Was there a life-saving intervention made? No

## 2016-06-25 NOTE — Assessment & Plan Note (Signed)
No dyspepsia or dysphagia.   -Repeat EGD due in 2018 -Continue PPI

## 2016-06-25 NOTE — Assessment & Plan Note (Addendum)
Resolved.  No leg edema in last 2 months.  Taking Lasix 40 mg BID  -continue current meds

## 2016-06-25 NOTE — Assessment & Plan Note (Addendum)
BP Readings from Last 3 Encounters:  06/26/16 (!) 145/69  06/10/16 (!) 148/90  05/19/16 (!) 144/80   Lab Results  Component Value Date   CREATININE 4.20 (H) 10/06/2015   Lab Results  Component Value Date   K 4.5 10/06/2015    Current medications: diltiazem 60 mg BID, lasix 40 mg BID  Assessment BP goal: <140/90 BP control: above goal  Plan Medications: increase diltiazem to 120 mg BID Other:

## 2016-06-25 NOTE — Assessment & Plan Note (Addendum)
   BMP Latest Ref Rng & Units 10/06/2015 09/21/2015 03/02/2015  Glucose 65 - 99 mg/dL 109(H) 94 130(H)  BUN 8 - 27 mg/dL 54(H) 38(H) 36(H)  Creatinine 0.76 - 1.27 mg/dL 4.20(H) 3.72(H) 3.31(H)  BUN/Creat Ratio 10 - 24 13 10 11   Sodium 134 - 144 mmol/L 142 145(H) 140  Potassium 3.5 - 5.2 mmol/L 4.5 4.3 4.3  Chloride 96 - 106 mmol/L 99 102 93(L)  CO2 18 - 29 mmol/L 27 25 28   Calcium 8.6 - 10.2 mg/dL 8.8 8.7 9.5   Component     Latest Ref Rng & Units 05/05/2016  Creatinine, Urine     Not Estab. mg/dL 44.1  Protein Urine Random     Not Estab. mg/dL 162.4  Protein/Creat Ratio     0 - 200 mg/g creat 3,683 (H)   Saw nephrologist Dr Justin Mend on 06/13/16.  Has not had any leg swelling in several months.  -Request nephrology records from [facility] -continue lasix 40 mg BID

## 2016-06-26 ENCOUNTER — Encounter (INDEPENDENT_AMBULATORY_CARE_PROVIDER_SITE_OTHER): Payer: Self-pay

## 2016-06-26 ENCOUNTER — Encounter: Payer: Self-pay | Admitting: Internal Medicine

## 2016-06-26 ENCOUNTER — Ambulatory Visit (INDEPENDENT_AMBULATORY_CARE_PROVIDER_SITE_OTHER): Payer: Medicare Other | Admitting: Internal Medicine

## 2016-06-26 VITALS — BP 140/77 | HR 81 | Temp 97.6°F | Wt 208.3 lb

## 2016-06-26 DIAGNOSIS — R0600 Dyspnea, unspecified: Secondary | ICD-10-CM | POA: Diagnosis not present

## 2016-06-26 DIAGNOSIS — K22719 Barrett's esophagus with dysplasia, unspecified: Secondary | ICD-10-CM

## 2016-06-26 DIAGNOSIS — Z79899 Other long term (current) drug therapy: Secondary | ICD-10-CM | POA: Diagnosis not present

## 2016-06-26 DIAGNOSIS — N184 Chronic kidney disease, stage 4 (severe): Secondary | ICD-10-CM | POA: Diagnosis not present

## 2016-06-26 DIAGNOSIS — R600 Localized edema: Secondary | ICD-10-CM

## 2016-06-26 DIAGNOSIS — Z87891 Personal history of nicotine dependence: Secondary | ICD-10-CM

## 2016-06-26 DIAGNOSIS — I129 Hypertensive chronic kidney disease with stage 1 through stage 4 chronic kidney disease, or unspecified chronic kidney disease: Secondary | ICD-10-CM

## 2016-06-26 DIAGNOSIS — K2270 Barrett's esophagus without dysplasia: Secondary | ICD-10-CM

## 2016-06-26 DIAGNOSIS — R21 Rash and other nonspecific skin eruption: Secondary | ICD-10-CM | POA: Diagnosis not present

## 2016-06-26 DIAGNOSIS — I10 Essential (primary) hypertension: Secondary | ICD-10-CM

## 2016-06-26 MED ORDER — FEBUXOSTAT 40 MG PO TABS: 80.0000 mg | ORAL_TABLET | Freq: Every day | ORAL | 3 refills | Status: DC

## 2016-06-26 MED ORDER — MOMETASONE FUROATE 0.1 % EX CREA: TOPICAL_CREAM | CUTANEOUS | 0 refills | Status: AC

## 2016-06-26 MED ORDER — DILTIAZEM HCL ER 120 MG PO CP12: 120.0000 mg | ORAL_CAPSULE | Freq: Two times a day (BID) | ORAL | 2 refills | Status: DC

## 2016-06-26 NOTE — Progress Notes (Signed)
   CC: "Itching on my chest and in my groin."  HPI:  Luke Dunn is a 66 y.o. man with history of HTN, CKD, and gout who presents for management of hypertension.  Please see A&P for status of the patient's chronic medical conditions.  Saw nephrologist Dr Justin Mend at [facility] on 1/23.  Past Medical History:  Diagnosis Date  . Barrett's esophagus   . CKD (chronic kidney disease) stage 4, GFR 15-29 ml/min (HCC)    Followed by [facility].  Thought to be due to long-standing HTN  . Edema   . Essential hypertension   . GERD (gastroesophageal reflux disease)   . Gout 05/04/2009   Qualifier: Diagnosis of  By: Dyann Kief MD, Clifton James      Review of Systems:  Review of Systems  Constitutional: Negative for chills and fever.  Respiratory: Positive for cough. Negative for sputum production and shortness of breath.   Cardiovascular: Negative for chest pain, palpitations, orthopnea, leg swelling and PND.  Gastrointestinal: Negative for blood in stool, heartburn and melena.  Skin: Positive for itching and rash.   Physical Exam:  Vitals:   06/26/16 1529  BP: (!) 145/69  Pulse: 86  Temp: 97.6 F (36.4 C)  TempSrc: Oral  SpO2: 98%  Weight: 208 lb 4.8 oz (94.5 kg)   Physical Exam  Constitutional: He is oriented to person, place, and time.  Obese, in no distress  Cardiovascular: Normal rate, regular rhythm and normal heart sounds.   Pulmonary/Chest: Effort normal and breath sounds normal.  Musculoskeletal: He exhibits no edema or tenderness.  Neurological: He is alert and oriented to person, place, and time.  Skin:  ~2 cm plaque with light scaling, mixed hyper and hypopigmentations on left chest under (see picture) Groin with flat, linear hypopigmented lesion in left inguinal fold, with 2-3 cm hyperpigmented plaque with light scale inferior to inguinal fold  Psychiatric: He has a normal mood and affect. His behavior is normal.       Labs 06/13/2016 from [facility] WBC  6.1 Hgb 11.2 Plt 203  Glucose 124 BUN 42 Cr 5.19 Na 141 K 4.1 Cl 101 CO2 25 Ca 8.7 Albumin 3.9  Assessment & Plan:   See Encounters Tab for problem based charting.  Patient discussed with Dr. Angelia Mould

## 2016-06-26 NOTE — Assessment & Plan Note (Signed)
Walks about 2 blocks, but unclear if limited by breathing or joint pain. Echo 04/2016 showed EF 55%, G1DD, no orthopnea or PND, do not CHF.  PFTs 12/2015 showed moderate diffusion deficit, moderate obstruction not responsive to bronchodilators, and moderate restriction, but study was noted to be limited by patient cooperation.  Have investigated CHF and COPD and not arrived and conclusive diagnosis of cardiopulmonary disease.  May represent obesity and deconditioning -continue to monitor

## 2016-06-26 NOTE — Congregational Nurse Program (Signed)
Congregational Nurse Program Note  Date of Encounter: 06/26/2016  Past Medical History: Past Medical History:  Diagnosis Date  . Barrett's esophagus   . CKD (chronic kidney disease) stage 4, GFR 15-29 ml/min (HCC)    Followed by [facility].  Thought to be due to long-standing HTN  . Edema   . Essential hypertension   . GERD (gastroesophageal reflux disease)   . Gout 05/04/2009   Qualifier: Diagnosis of  By: Dyann Kief MD, Matthias Hughs Details:  Home visit to fill pill box.  Accompanied patient to follow-up appointment with Dr. Inda Castle at [facility]. Diltiazem increased from 60 to 120 mg B.I.D.He was also given Elocon cream for a rash on chest and groin.  Next appointment in 6 months.  Need to call to schedule.  CN helped patient with food stamp renewal paperwork.  [name], RN, Congregational Nurse. [phone]     CNP Questionnaire - 06/21/16 1531      Patient Demographics   Is this a new or existing patient? Existing   Patient is considered a/an Refugee   Race Asian     Patient Assistance   Location of Patient Assistance Not Applicable   Patient's financial/insurance status Medicare;Medicaid   Uninsured Patient ([facility]) No   Patient referred to apply for the following financial assistance Not Applicable   Food insecurities addressed Not Applicable   Transportation assistance No   Type of Assistance Other   Assistance securing medications Yes   Type of Production manager;Other   Educational health offerings Not Applicable     Encounter Details   Primary purpose of visit Chronic Illness/Condition Visit   Was an Emergency Department visit averted? Not Applicable   Does patient have a medical provider? Yes   Patient referred to Not Applicable   Was a mental health screening completed? (GAINS tool) No   Does patient have dental issues? No   Was a dental referral made? No   Does patient have vision issues? No   Does your patient have an abnormal blood pressure today? No   Since previous encounter, have you referred patient for abnormal blood pressure that resulted in a new diagnosis or medication change? No   Does your patient have an abnormal blood glucose today? No   Since previous encounter, have you referred patient for abnormal blood glucose that resulted in a new diagnosis or medication change? No   Was there a life-saving intervention made? No

## 2016-06-26 NOTE — Patient Instructions (Signed)
For your blood pressure, increase the dose of Diltiazem to 120 mg twice daily.  For the rash on your chest and groin, apply a then coating of Elocon cream to each area once daily for 2 weeks.  If it does not improve, call the clinic and I will put in a referral to a dermatologist.

## 2016-06-26 NOTE — Assessment & Plan Note (Signed)
For almost 10 years, he has had itchy, scaling rashes on his left chest and groin.  They are grossly stable for a long time.  They are pruritic and flake/scale, but do not bleed.  Has previously tried OTC hydrocortisone and calamine lotion with minor, transient relief.  Possible prurigo or lichen planus.  Appears stable, benign, and excoriated, low concern for malignancy.  -mometasome 0.1% cream daily for 2 weeks -dermatology referral if no improvement -avoid scratching

## 2016-06-27 NOTE — Progress Notes (Signed)
Internal Medicine Clinic Attending  Case discussed with Dr. O'Sullivan at the time of the visit.  We reviewed the resident's history and exam and pertinent patient test results.  I agree with the assessment, diagnosis, and plan of care documented in the resident's note. 

## 2016-06-28 NOTE — Congregational Nurse Program (Signed)
Congregational Nurse Program Note  Date of Encounter: 06/28/2016  Past Medical History: Past Medical History:  Diagnosis Date  . Barrett's esophagus   . CKD (chronic kidney disease) stage 4, GFR 15-29 ml/min (HCC)    Followed by [facility].  Thought to be due to long-standing HTN  . Edema   . Essential hypertension   . GERD (gastroesophageal reflux disease)   . Gout 05/04/2009   Qualifier: Diagnosis of  By: Dyann Kief MD, Matthias Hughs Details:  Home visit with interpreter [facility].  Assisted patient with food stamp application and [facility] physician bill.   BP 140/80.  He had not yet increased Diltaizem dose as instructed at 06/26/2016 visit because he was finishing the old dose that was already in one of his pill boxes.  Both boxes now have correct dose.  He started Elocon cream today.  Next home visit scheduled for 07/12/2016.  [name] RN, Congregational Nurse [phone]     CNP Questionnaire - 06/28/16 2025      Patient Demographics   Is this a new or existing patient? Existing   Patient is considered a/an Refugee   Race Asian     Patient Assistance   Location of Patient Assistance Not Applicable   Patient's financial/insurance status Medicare;Medicaid   Uninsured Patient ([facility] Oncologist) No   Patient referred to apply for the following financial assistance Not Applicable   Food insecurities addressed Not Applicable   Transportation assistance No   Type of Assistance Other   Assistance securing medications No   Type of Assistance Other   Educational health offerings Not Applicable     Encounter Details   Primary purpose of visit Navigating the Healthcare System;Chronic Illness/Condition Visit   Was an Emergency Department visit averted? No   Does patient have a medical provider? Yes   Patient referred to Not Applicable   Was a mental health screening completed? (GAINS tool) No   Does patient have dental issues? No   Was a dental referral  made? No   Does patient have vision issues? No   Does your patient have an abnormal blood pressure today? Yes   Since previous encounter, have you referred patient for abnormal blood pressure that resulted in a new diagnosis or medication change? No   Does your patient have an abnormal blood glucose today? No   Since previous encounter, have you referred patient for abnormal blood glucose that resulted in a new diagnosis or medication change? No   Was there a life-saving intervention made? No

## 2016-07-13 NOTE — Congregational Nurse Program (Signed)
Congregational Nurse Program Note  Date of Encounter: 07/13/2016  Past Medical History: Past Medical History:  Diagnosis Date  . Barrett's esophagus   . CKD (chronic kidney disease) stage 4, GFR 15-29 ml/min (HCC)    Followed by [facility].  Thought to be due to long-standing HTN  . Edema   . Essential hypertension   . GERD (gastroesophageal reflux disease)   . Gout 05/04/2009   Qualifier: Diagnosis of  By: Dyann Kief MD, Matthias Hughs Details: Home visit.  BP 130/76.  Filled pill boxes for 2 weeks.  [name] RN, Congregational nurse [phone]      CNP Questionnaire - 07/13/16 2133      Patient Demographics   Is this a new or existing patient? Existing   Patient is considered a/an Refugee   Race Asian     Patient Assistance   Location of Patient Assistance Not Applicable   Patient's financial/insurance status Medicare;Medicaid   Uninsured Patient ([facility] Oncologist) No   Patient referred to apply for the following financial assistance Not Applicable   Food insecurities addressed Not Applicable   Transportation assistance No   Type of Assistance Other   Assistance securing medications No   Type of Assistance Other   Educational health offerings Not Applicable     Encounter Details   Primary purpose of visit Chronic Illness/Condition Visit   Was an Emergency Department visit averted? No   Does patient have a medical provider? Yes   Patient referred to Not Applicable   Was a mental health screening completed? (GAINS tool) No   Does patient have dental issues? No   Was a dental referral made? No   Does patient have vision issues? No   Does your patient have an abnormal blood pressure today? No   Since previous encounter, have you referred patient for abnormal blood pressure that resulted in a new diagnosis or medication change? No   Does your patient have an abnormal blood glucose today? No   Since previous encounter, have you referred patient  for abnormal blood glucose that resulted in a new diagnosis or medication change? No   Was there a life-saving intervention made? No

## 2016-07-26 NOTE — Congregational Nurse Program (Signed)
Congregational Nurse Program Note  Date of Encounter: 07/26/2016  Past Medical History: Past Medical History:  Diagnosis Date  . Barrett's esophagus   . CKD (chronic kidney disease) stage 4, GFR 15-29 ml/min (HCC)    Followed by [facility].  Thought to be due to long-standing HTN  . Edema   . Essential hypertension   . GERD (gastroesophageal reflux disease)   . Gout 05/04/2009   Qualifier: Diagnosis of  By: Dyann Kief MD, Matthias Hughs Details:  Home visit.  BP 136/80.  Filled pill boxes for 2 weeks.  [name] RN, Congregational Nurse [phone]     CNP Questionnaire - 07/26/16 1845      Patient Demographics   Is this a new or existing patient? Existing   Patient is considered a/an Refugee   Race Asian     Patient Assistance   Location of Patient Assistance Not Applicable   Patient's financial/insurance status Medicare;Medicaid   Uninsured Patient ([facility]) No   Patient referred to apply for the following financial assistance Not Applicable   Food insecurities addressed Not Applicable   Transportation assistance No   Type of Assistance Other   Assistance securing medications No   Type of Assistance Other   Educational health offerings Not Applicable     Encounter Details   Primary purpose of visit Chronic Illness/Condition Visit   Was an Emergency Department visit averted? No   Does patient have a medical provider? Yes   Patient referred to Not Applicable   Was a mental health screening completed? (GAINS tool) No   Does patient have dental issues? No   Was a dental referral made? No   Does patient have vision issues? No   Does your patient have an abnormal blood pressure today? No   Since previous encounter, have you referred patient for abnormal blood pressure that resulted in a new diagnosis or medication change? No   Does your patient have an abnormal blood glucose today? No   Since previous encounter, have you referred patient  for abnormal blood glucose that resulted in a new diagnosis or medication change? No   Was there a life-saving intervention made? No

## 2016-08-09 NOTE — Congregational Nurse Program (Signed)
Congregational Nurse Program Note  Date of Encounter: 08/09/2016  Past Medical History: Past Medical History:  Diagnosis Date  . Barrett's esophagus   . CKD (chronic kidney disease) stage 4, GFR 15-29 ml/min (HCC)    Followed by [facility].  Thought to be due to long-standing HTN  . Edema   . Essential hypertension   . GERD (gastroesophageal reflux disease)   . Gout 05/04/2009   Qualifier: Diagnosis of  By: Dyann Kief MD, Matthias Hughs Details:  Home visit.  Refilled pill boxes for 2 weeks.  BP 138/90.  [name] RN, Congregational Nurse [phone].     CNP Questionnaire - 08/09/16 1731      Patient Demographics   Is this a new or existing patient? 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 No   Does patient have a medical provider? Yes   Patient referred to Not Applicable   Was a mental health screening completed? (GAINS tool) No   Does patient have dental issues? No   Was a dental referral made? No   Does patient have vision issues? No   Does your patient have an abnormal blood pressure today? No   Since previous encounter, have you referred patient for abnormal blood pressure that resulted in a new diagnosis or medication change? No   Does your patient have an abnormal blood glucose today? No   Since previous encounter, have you  referred patient for abnormal blood glucose that resulted in a new diagnosis or medication change? No   Was there a life-saving intervention made? No

## 2016-08-23 NOTE — Congregational Nurse Program (Signed)
Congregational Nurse Program Note  Date of Encounter: 08/23/2016  Past Medical History: Past Medical History:  Diagnosis Date  . Barrett's esophagus   . CKD (chronic kidney disease) stage 4, GFR 15-29 ml/min (HCC)    Followed by [facility].  Thought to be due to long-standing HTN  . Edema   . Essential hypertension   . GERD (gastroesophageal reflux disease)   . Gout 05/04/2009   Qualifier: Diagnosis of  By: Dyann Kief MD, Matthias Hughs Details:  Home visit.  Filled pill boxes for 2 weeks.  BP 148/80.  Patient  states he took medication this morning but appears he may have missed several doses in the past 2 weeks (no interpreter available to explain why he still had medicine left in one of his pill boxes).  Will revisit in 1 week with interpreter.  [name] RN, Congregational Nurse [phone]     CNP Questionnaire - 08/23/16 1704      Patient Demographics   Is this a new or existing patient? Existing   Patient is considered a/an Refugee   Race Asian     Patient Assistance   Location of Patient Assistance Not Applicable   Patient's financial/insurance status Medicare;Medicaid   Uninsured Patient ([facility]) No   Patient referred to apply for the following financial assistance Not Applicable   Food insecurities addressed Not Applicable   Transportation assistance No   Type of Assistance Other   Assistance securing medications No   Educational health offerings Not Applicable     Encounter Details   Primary purpose of visit Chronic Illness/Condition Visit   Was an Emergency Department visit averted? No   Does patient have a medical provider? Yes   Patient referred to Not Applicable   Was a mental health screening completed? (GAINS tool) No   Does patient have dental issues? No   Was a dental referral made? No   Does patient have vision issues? No   Does your patient have an abnormal blood pressure today? Yes   Since previous encounter, have  you referred patient for abnormal blood pressure that resulted in a new diagnosis or medication change? No   Does your patient have an abnormal blood glucose today? No   Since previous encounter, have you referred patient for abnormal blood glucose that resulted in a new diagnosis or medication change? No   Was there a life-saving intervention made? No
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
## 2016-09-08 ENCOUNTER — Other Ambulatory Visit: Payer: Self-pay | Admitting: Internal Medicine

## 2016-09-08 DIAGNOSIS — I10 Essential (primary) hypertension: Secondary | ICD-10-CM

## 2016-09-08 DIAGNOSIS — R600 Localized edema: Secondary | ICD-10-CM

## 2016-09-08 DIAGNOSIS — N184 Chronic kidney disease, stage 4 (severe): Secondary | ICD-10-CM

## 2016-09-13 NOTE — Congregational Nurse Program (Signed)
Congregational Nurse Program Note  Date of Encounter: 09/13/2016  Past Medical History: Past Medical History:  Diagnosis Date  . Barrett's esophagus   . CKD (chronic kidney disease) stage 4, GFR 15-29 ml/min (HCC)    Followed by [facility].  Thought to be due to long-standing HTN  . Edema   . Essential hypertension   . GERD (gastroesophageal reflux disease)   . Gout 05/04/2009   Qualifier: Diagnosis of  By: Dyann Kief MD, Matthias Hughs Details:  Home visit with interpreter [facility].  BP 136/74.  Medication boxes filled for 2 weeks.  Informed patient CN had contacted Dr. Bethel Born and spoke with Nurse Zigmund Daniel.  She advised to follow-up with Nephrologist Dr. Justin Mend for evaluation of fistula. Awaiting return call to schedule appointment with Dr. Justin Mend.  [name] RN, Congregational Nurse [phone]     CNP Questionnaire - 09/13/16 2001      Patient Demographics   Is this a new or existing patient? Existing   Patient is considered a/an Refugee   Race Asian     Patient Assistance   Location of Patient Assistance Not Applicable   Patient's financial/insurance status Medicare;Medicaid   Uninsured Patient ([facility]) No   Patient referred to apply for the following financial assistance Not Applicable   Food insecurities addressed Not Applicable   Transportation assistance No   Type of Assistance Other   Assistance securing medications No   Educational health offerings Other;Navigating the healthcare system     Encounter Details   Primary purpose of visit Chronic Illness/Condition Visit;Other;Acute Illness/Condition Visit   Was an Emergency Department visit averted? Not Applicable   Does patient have a medical provider? Yes   Patient referred to [place]   Was a mental health screening completed? (GAINS tool) No   Does patient have dental issues? No   Was a dental referral made? No   Does patient have vision issues? No   Does your  patient have an abnormal blood pressure today? Yes   Since previous encounter, have you referred patient for abnormal blood pressure that resulted in a new diagnosis or medication change? No   Does your patient have an abnormal blood glucose today? No   Since previous encounter, have you referred patient for abnormal blood glucose that resulted in a new diagnosis or medication change? No   Was there a life-saving intervention made? No

## 2016-09-15 DIAGNOSIS — N189 Chronic kidney disease, unspecified: Secondary | ICD-10-CM | POA: Diagnosis not present

## 2016-09-15 DIAGNOSIS — N2581 Secondary hyperparathyroidism of renal origin: Secondary | ICD-10-CM | POA: Diagnosis not present

## 2016-09-15 DIAGNOSIS — E785 Hyperlipidemia, unspecified: Secondary | ICD-10-CM | POA: Diagnosis not present

## 2016-09-15 DIAGNOSIS — N183 Chronic kidney disease, stage 3 (moderate): Secondary | ICD-10-CM | POA: Diagnosis not present

## 2016-09-15 DIAGNOSIS — D631 Anemia in chronic kidney disease: Secondary | ICD-10-CM | POA: Diagnosis not present

## 2016-09-15 NOTE — Congregational Nurse Program (Signed)
Congregational Nurse Program Note  Date of Encounter: 09/15/2016  Past Medical History: Past Medical History:  Diagnosis Date  . Barrett's esophagus   . CKD (chronic kidney disease) stage 4, GFR 15-29 ml/min (HCC)    Followed by [facility].  Thought to be due to long-standing HTN  . Edema   . Essential hypertension   . GERD (gastroesophageal reflux disease)   . Gout 05/04/2009   Qualifier: Diagnosis of  By: Dyann Kief MD, Matthias Hughs Details:  CN and Interpreter Vung Ksor accompanied patient to see Dr. Justin Mend at [facility].  Dr. Justin Mend sent him from his office to [facility] to have further evaluation by Dr. Otelia Santee who determined that no treatment is needed and fistula is functional.  [name] RN, Congregational Nurse [phone]     CNP Questionnaire - 09/15/16 1610      Patient Demographics   Is this a new or existing patient? Existing   Patient is considered a/an Refugee   Race Asian     Patient Assistance   Location of Patient Assistance Not Applicable   Patient's financial/insurance status Medicare;Medicaid   Uninsured Patient ([facility]) No   Patient referred to apply for the following financial assistance Not Applicable   Food insecurities addressed Not Applicable   Transportation assistance No   Type of Assistance Other   Assistance securing medications No   Educational health offerings Other;Navigating the healthcare system     Encounter Details   Primary purpose of visit Chronic Illness/Condition Visit   Was an Emergency Department visit averted? Not Applicable   Does patient have a medical provider? Yes   Patient referred to Not Applicable   Was a mental health screening completed? (GAINS tool) No   Does patient have dental issues? No   Was a dental referral made? No   Does patient have vision issues? No   Does your patient have an abnormal blood pressure today? No   Since previous encounter, have you  referred patient for abnormal blood pressure that resulted in a new diagnosis or medication change? No   Does your patient have an abnormal blood glucose today? No   Since previous encounter, have you referred patient for abnormal blood glucose that resulted in a new diagnosis or medication change? No   Was there a life-saving intervention made? No

## 2016-09-27 NOTE — Congregational Nurse Program (Signed)
Congregational Nurse Program Note  Date of Encounter: 09/27/2016  Past Medical History: Past Medical History:  Diagnosis Date  . Barrett's esophagus   . CKD (chronic kidney disease) stage 4, GFR 15-29 ml/min (HCC)    Followed by [facility].  Thought to be due to long-standing HTN  . Edema   . Essential hypertension   . GERD (gastroesophageal reflux disease)   . Gout 05/04/2009   Qualifier: Diagnosis of  By: Dyann Kief MD, Matthias Hughs Details: Home visit with interpreter [facility]..  BP 134/70.  Pill boxes filled for 2 weeks. Patient was given a bottle a bottle of OTC Iron 325 mg.  We explained that his hemoglobin was low at his last visit to Dr. Justin Mend and that the doctor had called CN and wanted him to start this medicine, which was added to his pill boxes.  [name] RN, Congregational Nurse  [phone]     CNP Questionnaire - 09/27/16 1656      Patient Demographics   Is this a new or existing patient? Existing   Patient is considered a/an Refugee   Race Asian     Patient Assistance   Location of Patient Assistance Not Applicable   Patient's financial/insurance status Medicare;Medicaid   Uninsured Patient ([facility]) No   Patient referred to apply for the following financial assistance Not Applicable   Food insecurities addressed Not Applicable   Transportation assistance No   Type of Assistance Other   Assistance securing medications No   Educational health offerings Other     Encounter Details   Primary purpose of visit Chronic Illness/Condition Visit   Was an Emergency Department visit averted? Not Applicable   Does patient have a medical provider? Yes   Patient referred to Not Applicable   Was a mental health screening completed? (GAINS tool) No   Does patient have dental issues? No   Was a dental referral made? No   Does patient have vision issues? No   Does your patient have an abnormal blood pressure today? No   Since  previous encounter, have you referred patient for abnormal blood pressure that resulted in a new diagnosis or medication change? No   Does your patient have an abnormal blood glucose today? No   Since previous encounter, have you referred patient for abnormal blood glucose that resulted in a new diagnosis or medication change? No   Was there a life-saving intervention made? No

## 2016-10-11 NOTE — Congregational Nurse Program (Signed)
Congregational Nurse Program Note  Date of Encounter: 10/11/2016  Past Medical History: Past Medical History:  Diagnosis Date  . Barrett's esophagus   . CKD (chronic kidney disease) stage 4, GFR 15-29 ml/min (HCC)    Followed by [facility].  Thought to be due to long-standing HTN  . Edema   . Essential hypertension   . GERD (gastroesophageal reflux disease)   . Gout 05/04/2009   Qualifier: Diagnosis of  By: Dyann Kief MD, Matthias Hughs Details:  Home visit with interpreter [facility]  BP 140/80.  States he is taking medication but may occasionally forget a dose.  Pill boxes filled for 2 weeks.  [name] RN, Congregational Nurse  [phone]     CNP Questionnaire - 10/11/16 1909      Patient Demographics   Is this a new or existing patient? Existing   Patient is considered a/an Refugee   Race Asian     Patient Assistance   Location of Patient Assistance Not Applicable   Patient's financial/insurance status Medicare;Medicaid   Uninsured Patient ([facility]) No   Patient referred to apply for the following financial assistance Not Applicable   Food insecurities addressed Not Applicable   Transportation assistance No   Type of Assistance Other   Assistance securing medications No   Educational health offerings Other     Encounter Details   Primary purpose of visit Chronic Illness/Condition Visit   Was an Emergency Department visit averted? Not Applicable   Does patient have a medical provider? Yes   Patient referred to Not Applicable   Was a mental health screening completed? (GAINS tool) No   Does patient have dental issues? No   Was a dental referral made? No   Does patient have vision issues? No   Does your patient have an abnormal blood pressure today? Yes   Since previous encounter, have you referred patient for abnormal blood pressure that resulted in a new diagnosis or medication change? No   Does your patient have an abnormal  blood glucose today? No   Since previous encounter, have you referred patient for abnormal blood glucose that resulted in a new diagnosis or medication change? No   Was there a life-saving intervention made? No

## 2016-10-17 ENCOUNTER — Other Ambulatory Visit: Payer: Self-pay | Admitting: Internal Medicine

## 2016-10-27 ENCOUNTER — Encounter: Payer: Self-pay | Admitting: *Deleted

## 2016-11-01 DIAGNOSIS — N189 Chronic kidney disease, unspecified: Secondary | ICD-10-CM | POA: Diagnosis not present

## 2016-11-01 DIAGNOSIS — N183 Chronic kidney disease, stage 3 (moderate): Secondary | ICD-10-CM | POA: Diagnosis not present

## 2016-11-01 DIAGNOSIS — Z6839 Body mass index (BMI) 39.0-39.9, adult: Secondary | ICD-10-CM | POA: Diagnosis not present

## 2016-11-01 DIAGNOSIS — E785 Hyperlipidemia, unspecified: Secondary | ICD-10-CM | POA: Diagnosis not present

## 2016-11-01 DIAGNOSIS — D631 Anemia in chronic kidney disease: Secondary | ICD-10-CM | POA: Diagnosis not present

## 2016-11-01 DIAGNOSIS — N2581 Secondary hyperparathyroidism of renal origin: Secondary | ICD-10-CM | POA: Diagnosis not present

## 2016-11-01 NOTE — Congregational Nurse Program (Signed)
Congregational Nurse Program Note  Date of Encounter: 11/01/2016  Past Medical History: Past Medical History:  Diagnosis Date  . Barrett's esophagus   . CKD (chronic kidney disease) stage 4, GFR 15-29 ml/min (HCC)    Followed by [facility].  Thought to be due to long-standing HTN  . Edema   . Essential hypertension   . GERD (gastroesophageal reflux disease)   . Gout 05/04/2009   Qualifier: Diagnosis of  By: Dyann Kief MD, Matthias Hughs Details: CN and interpreter [name] accompanied patient to see Dr. Justin Mend at [facility].  He told patient he is now in stage 5 kidney failure. He is not yet recommending dialysis.  Lab work ordered.  BP 112/70 today.  Return in 3 months.  [name] RN, Congregational Nurse [phone].     CNP Questionnaire - 11/01/16 1827      Patient Demographics   Is this a new or existing patient? Existing   Patient is considered a/an Refugee   Race Asian     Patient Assistance   Location of Patient Assistance Not Applicable   Patient's financial/insurance status Medicare;Medicaid   Uninsured Patient ([facility]) No   Patient referred to apply for the following financial assistance Not Applicable   Food insecurities addressed Not Applicable   Transportation assistance No   Type of Assistance Other   Assistance securing medications No   Educational health offerings Other     Encounter Details   Primary purpose of visit Chronic Illness/Condition Visit   Was an Emergency Department visit averted? Not Applicable   Does patient have a medical provider? Yes   Patient referred to Not Applicable   Was a mental health screening completed? (GAINS tool) No   Does patient have dental issues? No   Was a dental referral made? No   Does patient have vision issues? No   Does your patient have an abnormal blood pressure today? No   Since previous encounter, have you referred patient for abnormal blood pressure that resulted in  a new diagnosis or medication change? No   Does your patient have an abnormal blood glucose today? No   Since previous encounter, have you referred patient for abnormal blood glucose that resulted in a new diagnosis or medication change? No   Was there a life-saving intervention made? No

## 2016-11-15 NOTE — Congregational Nurse Program (Signed)
Congregational Nurse Program Note  Date of Encounter: 11/15/2016  Past Medical History: Past Medical History:  Diagnosis Date  . Barrett's esophagus   . CKD (chronic kidney disease) stage 4, GFR 15-29 ml/min (HCC)    Followed by [facility].  Thought to be due to long-standing HTN  . Edema   . Essential hypertension   . GERD (gastroesophageal reflux disease)   . Gout 05/04/2009   Qualifier: Diagnosis of  By: Dyann Kief MD, Matthias Hughs Details:  Home visit with interpreter [facility].  BP 120/78.  Filled pill boxes for 2 weeks.  States he has had nausea, vomiting and headache x2 days.  Feeling better today and does not want CN to call MD.  Also complaining of weak knees and tiredness.  Informed patient that Dr. Jason Nest office called and stated that his hemoglobin was low on his last visit and he is scheduled to go to [place] on 11/20/2016 to receive iron.  [name] RN, Congregational Nurse [phone]     CNP Questionnaire - 11/15/16 1856      Patient Demographics   Is this a new or existing patient? Existing   Patient is considered a/an Refugee   Race Asian     Patient Assistance   Location of Patient Assistance Not Applicable   Patient's financial/insurance status Medicare;Medicaid   Uninsured Patient ([facility]) No   Patient referred to apply for the following financial assistance Not Applicable   Food insecurities addressed Not Applicable   Transportation assistance No   Type of Assistance Other   Assistance securing medications No   Educational health offerings Other     Encounter Details   Primary purpose of visit Chronic Illness/Condition Visit;Acute Illness/Condition Visit;Navigating the Healthcare System   Was an Emergency Department visit averted? Not Applicable   Does patient have a medical provider? Yes   Patient referred to Not Applicable   Was a mental health screening completed? (GAINS tool) No   Does patient  have dental issues? No   Was a dental referral made? No   Does patient have vision issues? No   Does your patient have an abnormal blood pressure today? No   Since previous encounter, have you referred patient for abnormal blood pressure that resulted in a new diagnosis or medication change? No   Does your patient have an abnormal blood glucose today? No   Since previous encounter, have you referred patient for abnormal blood glucose that resulted in a new diagnosis or medication change? No   Was there a life-saving intervention made? No

## 2016-11-20 ENCOUNTER — Encounter (HOSPITAL_COMMUNITY)
Admission: RE | Admit: 2016-11-20 | Discharge: 2016-11-20 | Disposition: A | Discharge status: Home / Self Care | Payer: Medicare Other | Source: Ambulatory Visit | Attending: Nephrology | Admitting: Nephrology

## 2016-11-20 DIAGNOSIS — N184 Chronic kidney disease, stage 4 (severe): Secondary | ICD-10-CM | POA: Insufficient documentation

## 2016-11-20 LAB — IRON AND TIBC
IRON: 45 ug/dL (ref 45–182)
Saturation Ratios: 18 % (ref 17.9–39.5)
TIBC: 253 ug/dL (ref 250–450)
UIBC: 208 ug/dL

## 2016-11-20 LAB — FERRITIN: Ferritin: 218 ng/mL (ref 24–336)

## 2016-11-20 LAB — POCT HEMOGLOBIN-HEMACUE: Hemoglobin: 10.7 g/dL — ABNORMAL LOW (ref 13.0–17.0)

## 2016-11-20 MED ORDER — EPOETIN ALFA 10000 UNIT/ML IJ SOLN
10000.0000 [IU] | INTRAMUSCULAR | Status: DC
  Administered 2016-11-20: 10000 [IU] via SUBCUTANEOUS

## 2016-11-20 MED ORDER — EPOETIN ALFA 10000 UNIT/ML IJ SOLN
INTRAMUSCULAR | Status: AC
  Administered 2016-11-20: 14:00:00 10000 [IU] via SUBCUTANEOUS
  Filled 2016-11-20: qty 1

## 2016-11-20 NOTE — Discharge Instructions (Signed)
Epoetin Alfa injection °What is this medicine? °EPOETIN ALFA (e POE e tin AL fa) helps your body make more red blood cells. This medicine is used to treat anemia caused by chronic kidney failure, cancer chemotherapy, or HIV-therapy. It may also be used before surgery if you have anemia. °This medicine may be used for other purposes; ask your health care provider or pharmacist if you have questions. °COMMON BRAND NAME(S): Epogen, Procrit °What should I tell my health care provider before I take this medicine? °They need to know if you have any of these conditions: °-blood clotting disorders °-cancer patient not on chemotherapy °-cystic fibrosis °-heart disease, such as angina or heart failure °-hemoglobin level of 12 g/dL or greater °-high blood pressure °-low levels of folate, iron, or vitamin B12 °-seizures °-an unusual or allergic reaction to erythropoietin, albumin, benzyl alcohol, hamster proteins, other medicines, foods, dyes, or preservatives °-pregnant or trying to get pregnant °-breast-feeding °How should I use this medicine? °This medicine is for injection into a vein or under the skin. It is usually given by a health care professional in a hospital or clinic setting. °If you get this medicine at home, you will be taught how to prepare and give this medicine. Use exactly as directed. Take your medicine at regular intervals. Do not take your medicine more often than directed. °It is important that you put your used needles and syringes in a special sharps container. Do not put them in a trash can. If you do not have a sharps container, call your pharmacist or healthcare provider to get one. °A special MedGuide will be given to you by the pharmacist with each prescription and refill. Be sure to read this information carefully each time. °Talk to your pediatrician regarding the use of this medicine in children. While this drug may be prescribed for selected conditions, precautions do apply. °Overdosage: If you  think you have taken too much of this medicine contact a poison control center or emergency room at once. °NOTE: This medicine is only for you. Do not share this medicine with others. °What if I miss a dose? °If you miss a dose, take it as soon as you can. If it is almost time for your next dose, take only that dose. Do not take double or extra doses. °What may interact with this medicine? °Do not take this medicine with any of the following medications: °-darbepoetin alfa °This list may not describe all possible interactions. Give your health care provider a list of all the medicines, herbs, non-prescription drugs, or dietary supplements you use. Also tell them if you smoke, drink alcohol, or use illegal drugs. Some items may interact with your medicine. °What should I watch for while using this medicine? °Your condition will be monitored carefully while you are receiving this medicine. °You may need blood work done while you are taking this medicine. °What side effects may I notice from receiving this medicine? °Side effects that you should report to your doctor or health care professional as soon as possible: °-allergic reactions like skin rash, itching or hives, swelling of the face, lips, or tongue °-breathing problems °-changes in vision °-chest pain °-confusion, trouble speaking or understanding °-feeling faint or lightheaded, falls °-high blood pressure °-muscle aches or pains °-pain, swelling, warmth in the leg °-rapid weight gain °-severe headaches °-sudden numbness or weakness of the face, arm or leg °-trouble walking, dizziness, loss of balance or coordination °-seizures (convulsions) °-swelling of the ankles, feet, hands °-unusually weak or tired °  Side effects that usually do not require medical attention (report to your doctor or health care professional if they continue or are bothersome): °-diarrhea °-fever, chills (flu-like symptoms) °-headaches °-nausea, vomiting °-redness, stinging, or swelling at  site where injected °This list may not describe all possible side effects. Call your doctor for medical advice about side effects. You may report side effects to FDA at 1-800-FDA-1088. °Where should I keep my medicine? °Keep out of the reach of children. °Store in a refrigerator between 2 and 8 degrees C (36 and 46 degrees F). Do not freeze or shake. Throw away any unused portion if using a single-dose vial. Multi-dose vials can be kept in the refrigerator for up to 21 days after the initial dose. Throw away unused medicine. °NOTE: This sheet is a summary. It may not cover all possible information. If you have questions about this medicine, talk to your doctor, pharmacist, or health care provider. °© 2018 Elsevier/Gold Standard (2015-12-27 19:42:31) ° °

## 2016-11-29 NOTE — Congregational Nurse Program (Signed)
Congregational Nurse Program Note  Date of Encounter: 11/29/2016  Past Medical History: Past Medical History:  Diagnosis Date  . Barrett's esophagus   . CKD (chronic kidney disease) stage 4, GFR 15-29 ml/min (HCC)    Followed by [facility].  Thought to be due to long-standing HTN  . Edema   . Essential hypertension   . GERD (gastroesophageal reflux disease)   . Gout 05/04/2009   Qualifier: Diagnosis of  By: Dyann Kief MD, Matthias Hughs Details:  Home visit with interpreter [facility].  BP 140/72.  Filled pill boxes for 2 weeks and called in request for refills at [facility] for Sodium Bicarb, Diltiazem, Pantoprazole and Uloric.  States he still has pain in left lower back when walking but refuses to make MD appointment.  Advised patient he can ice area and takeTylenol or Tramadol which he has been prescribed in the past for pain.  Reminded him of 12/04/2016 appointment at the [facility].  [name] RN, Congregational Nurse [phone]     CNP Questionnaire - 11/29/16 1733      Patient Demographics   Is this a new or existing patient? Existing   Patient is considered a/an Refugee   Race Asian     Patient Assistance   Location of Patient Assistance Not Applicable   Patient's financial/insurance status Medicare;Medicaid   Uninsured Patient ([facility]) No   Patient referred to apply for the following financial assistance Not Applicable   Food insecurities addressed Not Applicable   Transportation assistance No   Type of Assistance Other   Assistance securing medications Yes   Type of Restaurant manager, fast food health offerings Medications;Hypertension     Encounter Details   Primary purpose of visit Chronic Illness/Condition Visit;Acute Illness/Condition Visit   Was an Emergency Department visit averted? Not Applicable   Does patient have a medical provider? Yes   Patient referred to Not Applicable   Was a  mental health screening completed? (GAINS tool) No   Does patient have dental issues? No   Was a dental referral made? No   Does patient have vision issues? No   Does your patient have an abnormal blood pressure today? Yes   Since previous encounter, have you referred patient for abnormal blood pressure that resulted in a new diagnosis or medication change? No   Does your patient have an abnormal blood glucose today? No   Since previous encounter, have you referred patient for abnormal blood glucose that resulted in a new diagnosis or medication change? No   Was there a life-saving intervention made? No

## 2016-12-04 ENCOUNTER — Encounter (HOSPITAL_COMMUNITY)
Admission: RE | Admit: 2016-12-04 | Discharge: 2016-12-04 | Disposition: A | Discharge status: Home / Self Care | Payer: Medicare Other | Source: Ambulatory Visit | Attending: Nephrology | Admitting: Nephrology

## 2016-12-04 DIAGNOSIS — N184 Chronic kidney disease, stage 4 (severe): Secondary | ICD-10-CM | POA: Diagnosis not present

## 2016-12-04 LAB — POCT HEMOGLOBIN-HEMACUE: HEMOGLOBIN: 10.3 g/dL — AB (ref 13.0–17.0)

## 2016-12-04 MED ORDER — EPOETIN ALFA 10000 UNIT/ML IJ SOLN
10000.0000 [IU] | INTRAMUSCULAR | Status: DC
  Administered 2016-12-04: 13:00:00 10000 [IU] via SUBCUTANEOUS

## 2016-12-04 MED ORDER — EPOETIN ALFA 10000 UNIT/ML IJ SOLN
INTRAMUSCULAR | Status: AC
  Filled 2016-12-04: qty 1

## 2016-12-06 NOTE — Congregational Nurse Program (Signed)
Congregational Nurse Program Note  Date of Encounter: 12/06/2016  Past Medical History: Past Medical History:  Diagnosis Date  . Barrett's esophagus   . CKD (chronic kidney disease) stage 4, GFR 15-29 ml/min (HCC)    Followed by [facility].  Thought to be due to long-standing HTN  . Edema   . Essential hypertension   . GERD (gastroesophageal reflux disease)   . Gout 05/04/2009   Qualifier: Diagnosis of  By: Dyann Kief MD, Matthias Hughs Details:  Home visit with interpreter [facility].  Patient states his back is feeling better.  Filled pill boxes for 2 weeks.  [name] RN, Congregational Nurse [phone]     CNP Questionnaire - 12/06/16 1632      Patient Demographics   Is this a new or existing patient? Existing   Patient is considered a/an Refugee   Race Asian     Patient Assistance   Location of Patient Assistance Not Applicable   Patient's financial/insurance status Medicare;Medicaid   Uninsured Patient ([facility]) No   Patient referred to apply for the following financial assistance Not Applicable   Food insecurities addressed Not Applicable   Transportation assistance No   Type of Assistance Other   Assistance securing medications No     Encounter Details   Primary purpose of visit Other;Acute Illness/Condition Visit   Was an Emergency Department visit averted? Not Applicable   Does patient have a medical provider? Yes   Patient referred to Not Applicable   Was a mental health screening completed? (GAINS tool) No   Does patient have dental issues? No   Was a dental referral made? No   Does patient have vision issues? No   Does your patient have an abnormal blood pressure today? No   Since previous encounter, have you referred patient for abnormal blood pressure that resulted in a new diagnosis or medication change? No   Does your patient have an abnormal blood glucose today? No   Since previous encounter, have you referred  patient for abnormal blood glucose that resulted in a new diagnosis or medication change? No   Was there a life-saving intervention made? No

## 2016-12-18 ENCOUNTER — Encounter (HOSPITAL_COMMUNITY)
Admission: RE | Admit: 2016-12-18 | Discharge: 2016-12-18 | Disposition: A | Discharge status: Home / Self Care | Payer: Medicare Other | Source: Ambulatory Visit | Attending: Nephrology | Admitting: Nephrology

## 2016-12-18 DIAGNOSIS — N184 Chronic kidney disease, stage 4 (severe): Secondary | ICD-10-CM

## 2016-12-18 LAB — IRON AND TIBC
IRON: 31 ug/dL — AB (ref 45–182)
Saturation Ratios: 11 % — ABNORMAL LOW (ref 17.9–39.5)
TIBC: 276 ug/dL (ref 250–450)
UIBC: 245 ug/dL

## 2016-12-18 LAB — FERRITIN: Ferritin: 376 ng/mL — ABNORMAL HIGH (ref 24–336)

## 2016-12-18 LAB — POCT HEMOGLOBIN-HEMACUE: Hemoglobin: 10.2 g/dL — ABNORMAL LOW (ref 13.0–17.0)

## 2016-12-18 MED ORDER — EPOETIN ALFA 10000 UNIT/ML IJ SOLN
INTRAMUSCULAR | Status: AC
  Filled 2016-12-18: qty 1

## 2016-12-18 MED ORDER — EPOETIN ALFA 10000 UNIT/ML IJ SOLN
10000.0000 [IU] | INTRAMUSCULAR | Status: DC
  Administered 2016-12-18: 11:00:00 10000 [IU] via SUBCUTANEOUS

## 2017-01-01 ENCOUNTER — Encounter (HOSPITAL_COMMUNITY)
Admission: RE | Admit: 2017-01-01 | Discharge: 2017-01-01 | Disposition: A | Discharge status: Home / Self Care | Payer: Medicare Other | Source: Ambulatory Visit | Attending: Nephrology | Admitting: Nephrology

## 2017-01-01 DIAGNOSIS — N184 Chronic kidney disease, stage 4 (severe): Secondary | ICD-10-CM | POA: Insufficient documentation

## 2017-01-01 LAB — POCT HEMOGLOBIN-HEMACUE: Hemoglobin: 10.1 g/dL — ABNORMAL LOW (ref 13.0–17.0)

## 2017-01-01 MED ORDER — EPOETIN ALFA 10000 UNIT/ML IJ SOLN
10000.0000 [IU] | INTRAMUSCULAR | Status: DC
  Administered 2017-01-01: 10000 [IU] via SUBCUTANEOUS

## 2017-01-01 MED ORDER — EPOETIN ALFA 10000 UNIT/ML IJ SOLN
INTRAMUSCULAR | Status: AC
  Administered 2017-01-01: 11:00:00 10000 [IU] via SUBCUTANEOUS
  Filled 2017-01-01: qty 1

## 2017-01-01 NOTE — Congregational Nurse Program (Signed)
Congregational Nurse Program Note  Date of Encounter: 01/01/2017  Past Medical History: Past Medical History:  Diagnosis Date  . Barrett's esophagus   . CKD (chronic kidney disease) stage 4, GFR 15-29 ml/min (HCC)    Followed by [facility].  Thought to be due to long-standing HTN  . Edema   . Essential hypertension   . GERD (gastroesophageal reflux disease)   . Gout 05/04/2009   Qualifier: Diagnosis of  By: Dyann Kief MD, Matthias Hughs Details:  Home visit to fill pill boxes for 2 weeks.  Called in refill requests to [facility].  [name] RN, Congregational Nurse [phone]     CNP Questionnaire - 01/01/17 2116      Patient Demographics   Is this a new or existing patient? Existing   Patient is considered a/an Refugee   Race Asian     Patient Assistance   Location of Patient Assistance Not Applicable   Patient's financial/insurance status Medicare   Uninsured Patient ([facility] [employer]) No   Patient referred to apply for the following financial assistance Not Applicable   Food insecurities addressed Not Applicable   Transportation assistance No   Assistance securing medications Yes   Type of Restaurant manager, fast food health offerings Other     Encounter Details   Primary purpose of visit Chronic Illness/Condition Visit   Was an Emergency Department visit averted? Not Applicable   Does patient have a medical provider? Yes   Patient referred to Not Applicable   Was a mental health screening completed? (GAINS tool) No   Does patient have dental issues? No   Does patient have vision issues? No   Does your patient have an abnormal blood pressure today? No   Since previous encounter, have you referred patient for abnormal blood pressure that resulted in a new diagnosis or medication change? No   Does your patient have an abnormal blood glucose today? No   Since previous encounter, have you referred patient for abnormal blood  glucose that resulted in a new diagnosis or medication change? No   Was there a life-saving intervention made? No

## 2017-01-17 ENCOUNTER — Encounter (HOSPITAL_COMMUNITY)
Admission: RE | Admit: 2017-01-17 | Discharge: 2017-01-17 | Disposition: A | Discharge status: Home / Self Care | Payer: Medicare Other | Source: Ambulatory Visit | Attending: Nephrology | Admitting: Nephrology

## 2017-01-17 DIAGNOSIS — N184 Chronic kidney disease, stage 4 (severe): Secondary | ICD-10-CM | POA: Diagnosis not present

## 2017-01-17 LAB — IRON AND TIBC
IRON: 48 ug/dL (ref 45–182)
Saturation Ratios: 19 % (ref 17.9–39.5)
TIBC: 255 ug/dL (ref 250–450)
UIBC: 207 ug/dL

## 2017-01-17 LAB — FERRITIN: FERRITIN: 273 ng/mL (ref 24–336)

## 2017-01-17 MED ORDER — EPOETIN ALFA 10000 UNIT/ML IJ SOLN
INTRAMUSCULAR | Status: AC
  Administered 2017-01-17: 10000 [IU] via SUBCUTANEOUS
  Filled 2017-01-17: qty 1

## 2017-01-17 MED ORDER — EPOETIN ALFA 10000 UNIT/ML IJ SOLN
10000.0000 [IU] | INTRAMUSCULAR | Status: DC
  Administered 2017-01-17: 10000 [IU] via SUBCUTANEOUS

## 2017-01-17 NOTE — Congregational Nurse Program (Signed)
Congregational Nurse Program Note  Date of Encounter: 01/17/2017  Past Medical History: Past Medical History:  Diagnosis Date  . Barrett's esophagus   . CKD (chronic kidney disease) stage 4, GFR 15-29 ml/min (HCC)    Followed by [facility].  Thought to be due to long-standing HTN  . Edema   . Essential hypertension   . GERD (gastroesophageal reflux disease)   . Gout 05/04/2009   Qualifier: Diagnosis of  By: Dyann Kief MD, Matthias Hughs Details: Home visit.  Pill boxes filled for 2 weeks.  [employer] regarding refill for Diltiazem.  This medication is on back order.  They will notify me when it is available. Patient has enough to last for 9 days.  [name] RN, Congregational Nurse     CNP Questionnaire - 01/17/17 1722      Patient Demographics   Is this a new or existing patient? Existing   Patient is considered a/an Refugee   Race Asian     Patient Assistance   Location of Patient Assistance Not Applicable   Patient's financial/insurance status Medicare   Uninsured Patient ([facility] Card/[employer]) No   Patient referred to apply for the following financial assistance Not Applicable   Food insecurities addressed Not Applicable   Transportation assistance No   Assistance securing medications Yes   Type of Restaurant manager, fast food health offerings Other     Encounter Details   Primary purpose of visit Chronic Illness/Condition Visit   Was an Emergency Department visit averted? Not Applicable   Does patient have a medical provider? Yes   Patient referred to Not Applicable   Was a mental health screening completed? (GAINS tool) No   Does patient have dental issues? No   Does patient have vision issues? No   Does your patient have an abnormal blood pressure today? No   Since previous encounter, have you referred patient for abnormal blood pressure that resulted in a new diagnosis or medication change? No   Does your patient  have an abnormal blood glucose today? No   Since previous encounter, have you referred patient for abnormal blood glucose that resulted in a new diagnosis or medication change? No   Was there a life-saving intervention made? No

## 2017-01-18 LAB — POCT HEMOGLOBIN-HEMACUE: Hemoglobin: 9.5 g/dL — ABNORMAL LOW (ref 13.0–17.0)

## 2017-01-23 ENCOUNTER — Ambulatory Visit (INDEPENDENT_AMBULATORY_CARE_PROVIDER_SITE_OTHER): Payer: Medicare Other | Admitting: Internal Medicine

## 2017-01-23 ENCOUNTER — Encounter: Payer: Self-pay | Admitting: Internal Medicine

## 2017-01-23 VITALS — BP 149/79 | HR 101 | Temp 98.0°F | Wt 192.9 lb

## 2017-01-23 DIAGNOSIS — I129 Hypertensive chronic kidney disease with stage 1 through stage 4 chronic kidney disease, or unspecified chronic kidney disease: Secondary | ICD-10-CM | POA: Diagnosis not present

## 2017-01-23 DIAGNOSIS — D509 Iron deficiency anemia, unspecified: Secondary | ICD-10-CM

## 2017-01-23 DIAGNOSIS — G8929 Other chronic pain: Secondary | ICD-10-CM | POA: Diagnosis not present

## 2017-01-23 DIAGNOSIS — I10 Essential (primary) hypertension: Secondary | ICD-10-CM

## 2017-01-23 DIAGNOSIS — M545 Low back pain: Secondary | ICD-10-CM | POA: Diagnosis not present

## 2017-01-23 DIAGNOSIS — M549 Dorsalgia, unspecified: Secondary | ICD-10-CM | POA: Insufficient documentation

## 2017-01-23 DIAGNOSIS — N184 Chronic kidney disease, stage 4 (severe): Secondary | ICD-10-CM | POA: Diagnosis not present

## 2017-01-23 DIAGNOSIS — R0600 Dyspnea, unspecified: Secondary | ICD-10-CM | POA: Diagnosis not present

## 2017-01-23 DIAGNOSIS — Z23 Encounter for immunization: Secondary | ICD-10-CM | POA: Diagnosis present

## 2017-01-23 MED ORDER — DILTIAZEM HCL ER 120 MG PO CP12: 120.0000 mg | ORAL_CAPSULE | Freq: Two times a day (BID) | ORAL | 3 refills | Status: DC

## 2017-01-23 MED ORDER — BACLOFEN 10 MG PO TABS: 5.0000 mg | ORAL_TABLET | Freq: Two times a day (BID) | ORAL | 0 refills | Status: DC

## 2017-01-23 NOTE — Assessment & Plan Note (Signed)
Pt has history of iron deficiency as well as anemia of chronic kidney disease.  However his labs from 5 days ago show his iron is now in the normal range he has been taking iron supplements and getting EPO injections from his nephrologist.  It seems that his hemoglobin has been slowly trending down.  Pt denies any hematuria or noticeable blood in his stool.    -continue iron supplementation per nephrology and epo injections -pt is asymptomatic, will continue to monitor

## 2017-01-23 NOTE — Patient Instructions (Signed)
Please tell us if the baclofen helps with your back pain, try some over the counter icy hot or bengay as well.

## 2017-01-23 NOTE — Assessment & Plan Note (Addendum)
BP 149/79 today which is higher than usual for him per caretaker it is checked every two weeks when he gets EPO injections but they did admit they walked a long ways to get here and were taken back for vitals shortly afterwards.  Additionally pt forgot to take his medication today which would explain the higher heart rate and blood pressure.  -Continue cardizem 120mg  BID and lasix 40mg  BID

## 2017-01-23 NOTE — Assessment & Plan Note (Addendum)
PTs EF is 55% with grade 1DD likely has HFpEF but also pts poor renal function a contributing factor.  Lasix has been uptitrated for LE edema and SOB.  Pt has not been having any shortness of breath and reports urinating frequently on lasix 40mg  BID.    -additionally pt has had no recent problems with LE edema -continue lasix 40mg  BID

## 2017-01-23 NOTE — Progress Notes (Addendum)
   CC: Left lower back pain  HPI:  Luke Dunn is a 66 y.o. here to address his  Low back pain that has been bothering him for the last 2 months.  It is a constant pain that is worse with ambulating and bending over, he denies any weakness or decreased sensation/tingling or pain radiating down his leg.  We also addressed his chronic medical conditions of dyspnea, Anemia and HTN  Please see A&P for status of the patient's chronic medical conditions  Past Medical History:  Diagnosis Date  . Barrett's esophagus   . CKD (chronic kidney disease) stage 4, GFR 15-29 ml/min (HCC)    Followed by [facility].  Thought to be due to long-standing HTN  . Edema   . Essential hypertension   . GERD (gastroesophageal reflux disease)   . Gout 05/04/2009   Qualifier: Diagnosis of  By: Dyann Kief MD, Clifton James     Review of Systems:  ROS: Pulmonary: pt denies increased work of breathing, shortness of breath,  Cardiac: pt denies palpitations, chest pain,  Abdominal: pt denies abdominal pain, nausea, vomiting, or diarrhea  Physical Exam:  Vitals:   01/23/17 1536  BP: (!) 149/79  Pulse: (!) 101  Temp: 98 F (36.7 C)  TempSrc: Oral  SpO2: 98%  Weight: 192 lb 14.4 oz (87.5 kg)   Physical Exam  Constitutional: He is oriented to person, place, and time. He appears well-developed and well-nourished.  HENT:  Head: Normocephalic and atraumatic.  Eyes: Left eye exhibits no discharge. No scleral icterus.  Neck: JVD (minimal 0.5cm above clavicle) present. No thyromegaly present.  Cardiovascular: Regular rhythm.  Tachycardia present.  Exam reveals no friction rub.   No murmur heard. Pulmonary/Chest: No respiratory distress. He has no wheezes. He has no rales.  Abdominal: Soft. Bowel sounds are normal. He exhibits no distension. There is no tenderness. There is no guarding.  Musculoskeletal: He exhibits no edema or deformity.       Lumbar back: He exhibits tenderness and pain. He exhibits no swelling and  no edema.       Back:  Neurological: He is alert and oriented to person, place, and time.  Skin: Skin is warm and dry.  Psychiatric: He has a normal mood and affect. His behavior is normal.    Assessment & Plan:   See Encounters Tab for problem based charting.  Patient seen with Dr. Dareen Piano

## 2017-01-23 NOTE — Assessment & Plan Note (Signed)
pts pain seems to be acute on chronic.  Pain is in left CVA area tender to palpation and pt tenses up when he is palpated int hat area.  Pt was put on baclofen for muscle spasms that occurred around the same area that he presents with today (see physical exam).    -Will reorder baclofen as pt had some success with this from a prior visit

## 2017-01-24 NOTE — Progress Notes (Signed)
Internal Medicine Clinic Attending  I saw and evaluated the patient.  I personally confirmed the key portions of the history and exam documented by Dr. Winfrey and I reviewed pertinent patient test results.  The assessment, diagnosis, and plan were formulated together and I agree with the documentation in the resident's note. 

## 2017-01-25 NOTE — Congregational Nurse Program (Signed)
Congregational Nurse Program Note  Date of Encounter: 01/24/2017  Past Medical History: Past Medical History:  Diagnosis Date  . Barrett's esophagus   . CKD (chronic kidney disease) stage 4, GFR 15-29 ml/min (HCC)    Followed by [facility].  Thought to be due to long-standing HTN  . Edema   . Essential hypertension   . GERD (gastroesophageal reflux disease)   . Gout 05/04/2009   Qualifier: Diagnosis of  By: Dyann Kief MD, Matthias Hughs Details:  Home visit with interpreter [facility].  CN picked up Baclofen from [facility] and delivered it to patient.  We explained dosage and instructions to him and he stated he understood. We will follow-up with in in one week to determine if it is helping his back pain.  [name] RN, Congregational Nurse     CNP Questionnaire - 01/24/17 1159      Patient Demographics   Is this a new or existing patient? Existing   Patient is considered a/an Refugee   Race Asian     Patient Assistance   Location of Patient Assistance Not Applicable   Patient's financial/insurance status Medicare   Uninsured Patient ([facility] Card/[employer]) No   Patient referred to apply for the following financial assistance Not Applicable   Food insecurities addressed Not Applicable   Transportation assistance No   Assistance securing medications Yes   Type of Restaurant manager, fast food health offerings Medications     Encounter Details   Primary purpose of visit Acute Illness/Condition Visit   Was an Emergency Department visit averted? Not Applicable   Does patient have a medical provider? Yes   Patient referred to Not Applicable   Was a mental health screening completed? (GAINS tool) No   Does patient have dental issues? No   Does patient have vision issues? No   Does your patient have an abnormal blood pressure today? No   Since previous encounter, have you referred patient for abnormal blood pressure that resulted in a  new diagnosis or medication change? No   Does your patient have an abnormal blood glucose today? No   Since previous encounter, have you referred patient for abnormal blood glucose that resulted in a new diagnosis or medication change? No   Was there a life-saving intervention made? No

## 2017-01-31 ENCOUNTER — Encounter (HOSPITAL_COMMUNITY)
Admission: RE | Admit: 2017-01-31 | Discharge: 2017-01-31 | Disposition: A | Discharge status: Home / Self Care | Payer: Medicare Other | Source: Ambulatory Visit | Attending: Nephrology | Admitting: Nephrology

## 2017-01-31 ENCOUNTER — Telehealth: Payer: Self-pay | Admitting: *Deleted

## 2017-01-31 DIAGNOSIS — N184 Chronic kidney disease, stage 4 (severe): Secondary | ICD-10-CM | POA: Diagnosis not present

## 2017-01-31 LAB — POCT HEMOGLOBIN-HEMACUE: HEMOGLOBIN: 8.9 g/dL — AB (ref 13.0–17.0)

## 2017-01-31 MED ORDER — EPOETIN ALFA 10000 UNIT/ML IJ SOLN
INTRAMUSCULAR | Status: AC
  Administered 2017-01-31: 14:00:00 10000 [IU] via SUBCUTANEOUS
  Filled 2017-01-31: qty 1

## 2017-01-31 MED ORDER — AMLODIPINE BESYLATE 10 MG PO TABS: 10.0000 mg | ORAL_TABLET | Freq: Every day | ORAL | 3 refills | Status: DC

## 2017-01-31 MED ORDER — EPOETIN ALFA 10000 UNIT/ML IJ SOLN
10000.0000 [IU] | INTRAMUSCULAR | Status: DC
  Administered 2017-01-31: 10000 [IU] via SUBCUTANEOUS

## 2017-01-31 NOTE — Telephone Encounter (Signed)
Per pharmacy- diltiazem is on backorder from manufacturer. Requesting alternative. Per congregational nurse- patient has taken last dose 2 days ago.  Today BP 138/68, HR 93. Last OV 9/4 with Dr. Shan Levans.

## 2017-01-31 NOTE — Telephone Encounter (Signed)
Ok. We can switch the Diltiazem back to Amlodipine 10mg  daily, which the patient was taking for many years prior to this change. He will need follow up with [place] in the next 4 weeks for a BP check, he may need a third agent if BP not controlled on that med.

## 2017-01-31 NOTE — Telephone Encounter (Signed)
Informed Chrys Racer (nurse). Will pick up med for pt & will call to set up appt.

## 2017-01-31 NOTE — Congregational Nurse Program (Signed)
Congregational Nurse Program Note  Date of Encounter: 01/31/2017  Past Medical History: Past Medical History:  Diagnosis Date  . Barrett's esophagus   . CKD (chronic kidney disease) stage 4, GFR 15-29 ml/min (HCC)    Followed by [facility].  Thought to be due to long-standing HTN  . Edema   . Essential hypertension   . GERD (gastroesophageal reflux disease)   . Gout 05/04/2009   Qualifier: Diagnosis of  By: Dyann Kief MD, Matthias Hughs Details:  Home visit.  Patient has been out of Diltiazem for 2 days because it is on backorder and not available.  Phone call to [facility] Internal Medicine clinic.  Prescription for Lisinopril sent to [facility] who will deliver it tomorrow morning.  Patient needs follow-up appointment in 4 weeks to evaluate BP.  [name] RN, Congregational Nurse [phone]     CNP Questionnaire - 01/31/17 1719      Patient Demographics   Is this a new or existing patient? Existing   Patient is considered a/an Refugee   Race Asian     Patient Assistance   Location of Patient Assistance Not Applicable   Patient's financial/insurance status Medicare   Uninsured Patient ([facility] Card/Care Connects) No   Patient referred to apply for the following financial assistance Not Applicable   Food insecurities addressed Not Applicable   Transportation assistance No   Assistance securing medications Yes   Type of Restaurant manager, fast food health offerings Medications     Encounter Details   Primary purpose of visit Chronic Illness/Condition Visit;Other   Was an Emergency Department visit averted? Not Applicable   Does patient have a medical provider? Yes   Patient referred to Not Applicable   Was a mental health screening completed? (GAINS tool) No   Does patient have dental issues? No   Does patient have vision issues? No   Does your patient have an abnormal blood pressure today? No   Since previous encounter, have you referred  patient for abnormal blood pressure that resulted in a new diagnosis or medication change? No   Does your patient have an abnormal blood glucose today? No   Since previous encounter, have you referred patient for abnormal blood glucose that resulted in a new diagnosis or medication change? No   Was there a life-saving intervention made? No

## 2017-02-05 ENCOUNTER — Other Ambulatory Visit: Payer: Self-pay | Admitting: Internal Medicine

## 2017-02-05 DIAGNOSIS — I10 Essential (primary) hypertension: Secondary | ICD-10-CM

## 2017-02-07 DIAGNOSIS — Z6837 Body mass index (BMI) 37.0-37.9, adult: Secondary | ICD-10-CM | POA: Diagnosis not present

## 2017-02-07 DIAGNOSIS — N2581 Secondary hyperparathyroidism of renal origin: Secondary | ICD-10-CM | POA: Diagnosis not present

## 2017-02-07 DIAGNOSIS — D631 Anemia in chronic kidney disease: Secondary | ICD-10-CM | POA: Diagnosis not present

## 2017-02-07 DIAGNOSIS — E785 Hyperlipidemia, unspecified: Secondary | ICD-10-CM | POA: Diagnosis not present

## 2017-02-07 DIAGNOSIS — N183 Chronic kidney disease, stage 3 (moderate): Secondary | ICD-10-CM | POA: Diagnosis not present

## 2017-02-07 NOTE — Congregational Nurse Program (Signed)
Congregational Nurse Program Note  Date of Encounter: 02/07/2017  Past Medical History: Past Medical History:  Diagnosis Date  . Barrett's esophagus   . CKD (chronic kidney disease) stage 4, GFR 15-29 ml/min (HCC)    Followed by [facility].  Thought to be due to long-standing HTN  . Edema   . Essential hypertension   . GERD (gastroesophageal reflux disease)   . Gout 05/04/2009   Qualifier: Diagnosis of  By: Dyann Kief MD, Matthias Hughs Details:  CN and Interpreter [name] accompanied patient to appointment with Dr. Justin Mend at [facility].  No treatment changes at present.  Patient is to obtain 3 samples for occult blood in stool due to low hemoglobin and fact that he says stools are black.  Blood drawn to assess kidney function.  BP 124/69.  Home visit after appointment to fill pill boxes for 2 weeks.  [name] RN, Congregational Nurse [phone]     CNP Questionnaire - 02/07/17 2049      Patient Demographics   Is this a new or existing patient? Existing   Patient is considered a/an Refugee   Race Asian     Patient Assistance   Location of Patient Assistance Not Applicable   Patient's financial/insurance status Medicare;Medicaid;Low Income   Uninsured Patient ([facility] Card/Care Connects) No   Patient referred to apply for the following financial assistance Not Applicable   Food insecurities addressed Not Applicable   Transportation assistance No   Assistance securing medications No   Educational health offerings Medications;Health literacy;Navigating the healthcare system     Encounter Details   Primary purpose of visit Chronic Illness/Condition Visit;Other;Navigating the Healthcare System   Was an Emergency Department visit averted? Not Applicable   Does patient have a medical provider? Yes   Patient referred to Not Applicable   Was a mental health screening completed? (GAINS tool) No   Does patient have dental issues? No   Does patient have vision  issues? No   Does your patient have an abnormal blood pressure today? No   Since previous encounter, have you referred patient for abnormal blood pressure that resulted in a new diagnosis or medication change? No   Does your patient have an abnormal blood glucose today? No   Since previous encounter, have you referred patient for abnormal blood glucose that resulted in a new diagnosis or medication change? No   Was there a life-saving intervention made? No

## 2017-02-14 ENCOUNTER — Ambulatory Visit (INDEPENDENT_AMBULATORY_CARE_PROVIDER_SITE_OTHER): Payer: Medicare Other | Admitting: Internal Medicine

## 2017-02-14 ENCOUNTER — Encounter (HOSPITAL_COMMUNITY)
Admission: RE | Admit: 2017-02-14 | Discharge: 2017-02-14 | Disposition: A | Discharge status: Home / Self Care | Payer: Medicare Other | Source: Ambulatory Visit | Attending: Nephrology | Admitting: Nephrology

## 2017-02-14 ENCOUNTER — Encounter: Payer: Self-pay | Admitting: Internal Medicine

## 2017-02-14 VITALS — BP 143/84 | HR 90 | Temp 98.3°F | Ht 63.0 in | Wt 183.0 lb

## 2017-02-14 DIAGNOSIS — Z87891 Personal history of nicotine dependence: Secondary | ICD-10-CM

## 2017-02-14 DIAGNOSIS — Z79899 Other long term (current) drug therapy: Secondary | ICD-10-CM

## 2017-02-14 DIAGNOSIS — N184 Chronic kidney disease, stage 4 (severe): Secondary | ICD-10-CM | POA: Diagnosis not present

## 2017-02-14 DIAGNOSIS — D509 Iron deficiency anemia, unspecified: Secondary | ICD-10-CM | POA: Diagnosis not present

## 2017-02-14 DIAGNOSIS — B351 Tinea unguium: Secondary | ICD-10-CM | POA: Diagnosis not present

## 2017-02-14 DIAGNOSIS — M545 Low back pain, unspecified: Secondary | ICD-10-CM

## 2017-02-14 DIAGNOSIS — I10 Essential (primary) hypertension: Secondary | ICD-10-CM

## 2017-02-14 DIAGNOSIS — G8911 Acute pain due to trauma: Secondary | ICD-10-CM | POA: Diagnosis not present

## 2017-02-14 DIAGNOSIS — G8929 Other chronic pain: Secondary | ICD-10-CM

## 2017-02-14 LAB — IRON AND TIBC
Iron: 52 ug/dL (ref 45–182)
SATURATION RATIOS: 18 % (ref 17.9–39.5)
TIBC: 288 ug/dL (ref 250–450)
UIBC: 236 ug/dL

## 2017-02-14 LAB — FERRITIN: Ferritin: 227 ng/mL (ref 24–336)

## 2017-02-14 LAB — POCT HEMOGLOBIN-HEMACUE: HEMOGLOBIN: 10.1 g/dL — AB (ref 13.0–17.0)

## 2017-02-14 MED ORDER — EPOETIN ALFA 10000 UNIT/ML IJ SOLN
INTRAMUSCULAR | Status: AC
  Administered 2017-02-14: 10000 [IU] via SUBCUTANEOUS
  Filled 2017-02-14: qty 1

## 2017-02-14 MED ORDER — CYCLOBENZAPRINE HCL 7.5 MG PO TABS: 7.5000 mg | ORAL_TABLET | Freq: Three times a day (TID) | ORAL | 2 refills | Status: AC | PRN

## 2017-02-14 MED ORDER — EPOETIN ALFA 10000 UNIT/ML IJ SOLN
10000.0000 [IU] | INTRAMUSCULAR | Status: DC
  Administered 2017-02-14: 10000 [IU] via SUBCUTANEOUS

## 2017-02-14 NOTE — Assessment & Plan Note (Signed)
Patient has onychomycosis under his left great toe, additionally worries me has been trying to cut his toenail with a knife.  His toenail is long and brittle, when asked if he would like a referral to podiatry to help him manage this he is very enthusiastic about this idea.  -Referred patient to podiatry

## 2017-02-14 NOTE — Congregational Nurse Program (Signed)
Congregational Nurse Program Note  Date of Encounter: 02/14/2017  Past Medical History: Past Medical History:  Diagnosis Date  . Barrett's esophagus   . CKD (chronic kidney disease) stage 4, GFR 15-29 ml/min (HCC)    Followed by [facility].  Thought to be due to long-standing HTN  . Edema   . Essential hypertension   . GERD (gastroesophageal reflux disease)   . Gout 05/04/2009   Qualifier: Diagnosis of  By: Dyann Kief MD, Matthias Hughs Details:  Home visit with interpreter [facility].  Filled pill box.  Accompanied patient to MD appointment at [facility].  Picked up [place] gel as recommended by MD for back pain and requested [facility] deliver the muscle relaxant prescribed.  [name] RN, Congregational Nurse [phone].     CNP Questionnaire - 02/14/17 2036      Patient Demographics   Is this a new or existing patient? Existing   Patient is considered a/an Refugee   Race Asian     Patient Assistance   Location of Patient Assistance Not Applicable   Patient's financial/insurance status Medicare;Medicaid;Low Income   Uninsured Patient ([place] Card/Care Connects) No   Patient referred to apply for the following financial assistance Not Applicable   Food insecurities addressed Not Applicable   Transportation assistance No   Assistance securing medications Yes   Type of Restaurant manager, fast food health offerings Medications;Health literacy;Navigating the healthcare system     Encounter Details   Primary purpose of visit Chronic Illness/Condition Visit;Other;Navigating the Healthcare System;Acute Illness/Condition Visit   Was an Emergency Department visit averted? Not Applicable   Does patient have a medical provider? Yes   Patient referred to Not Applicable   Was a mental health screening completed? (GAINS tool) No   Does patient have dental issues? No   Does patient have vision issues? No   Does your patient have  an abnormal blood pressure today? No   Since previous encounter, have you referred patient for abnormal blood pressure that resulted in a new diagnosis or medication change? No   Does your patient have an abnormal blood glucose today? No   Since previous encounter, have you referred patient for abnormal blood glucose that resulted in a new diagnosis or medication change? No   Was there a life-saving intervention made? No         Clinical Intake - 02/14/17 1439      Pain   Pain  0-10   Pain Score 7    Pain Type Chronic pain   Pain Location Back   Pain Orientation Lower   Pain Descriptors / Indicators Aching   Pain Onset Other (comment)   Pain Frequency Intermittent     Nutrition Screen   BMI - recorded 32.42   Nutritional Status BMI > 30  Obese   Nutritional Risks None   Diabetes No     Functional Status   Activities of Daily Living Independent   Ambulation Independent   Medication Administration Independent   Home Management Independent     Risk/Barriers   Barriers to Care Management & Learning None

## 2017-02-14 NOTE — Assessment & Plan Note (Signed)
BP Readings from Last 3 Encounters:  02/14/17 (!) 143/84  02/14/17 128/63  02/07/17 124/69   I feel are blood pressure reading was inaccurate because is taken on the forearm, his last 2 including one earlier in the day with the nephrologist they were consistently within normal range.  -Continue amlodipine 10 mg and Lasix 40 mg
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
## 2017-02-14 NOTE — Patient Instructions (Signed)
We have placed a referral to physical therapy that will help you stretch and strengthen your back. Topical Icy hot will be helpful as well and a heating pad.  We have switched baclofen to felxeril and expect a better result, please take this only when you need to as it can make you drowsy and somewhat more unsteady on your feet.  It is great to take at night.  Your bp is doing great we will continue the amlodipine and lasix and your hgb count is coming up.

## 2017-02-14 NOTE — Assessment & Plan Note (Signed)
Patient's hemoglobin is 10.1 today which is a great improvement.  He has only just turned in his Hemoccult cards yesterday.  I suspect his anemia is from blood loss but more due to his chronic kidney disease.  He is been getting regular epo injections from nephrology.  -nephrology will continue epo and iron supplements -continue to monitor
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
## 2017-02-20 NOTE — Progress Notes (Signed)
Internal Medicine Clinic Attending  I saw and evaluated the patient.  I personally confirmed the key portions of the history and exam documented by Dr. Winfrey and I reviewed pertinent patient test results.  The assessment, diagnosis, and plan were formulated together and I agree with the documentation in the resident's note. 

## 2017-02-21 ENCOUNTER — Telehealth: Payer: Self-pay | Admitting: *Deleted

## 2017-02-21 NOTE — Telephone Encounter (Addendum)
Informationn wa sent through CoverMyMeds for PA for Cyclobenaprine.  Awaiting determination with 3 business days.  Sander Nephew, RN 02/21/2017 10:52 AM PA for Cyclobenzaprine was approved 05/20/2016 thru 05/20/2017.   [facility] was called and notified of.   Sander Nephew, RN 02/22/2017 11:02 AM

## 2017-02-21 NOTE — Addendum Note (Signed)
Addended by: Hulan Fray on: 02/21/2017 06:15 PM   Modules accepted: Orders

## 2017-02-22 NOTE — Congregational Nurse Program (Signed)
Congregational Nurse Program Note  Date of Encounter: 02/22/2017  Past Medical History: Past Medical History:  Diagnosis Date  . Barrett's esophagus   . CKD (chronic kidney disease) stage 4, GFR 15-29 ml/min (HCC)    Followed by [facility].  Thought to be due to long-standing HTN  . Edema   . Essential hypertension   . GERD (gastroesophageal reflux disease)   . Gout 05/04/2009   Qualifier: Diagnosis of  By: Dyann Kief MD, Matthias Hughs Details:  Home visit with [employer] nail techs.   Patient had been referred to a podiatric but insurance would not cover visit for nail trim only.  CN arranged for home visit to have his toe nails cut.  Patient also needs Sodium Bicarb refill.  [facility] notified and they will contact his PCP.  [name] RN, Congregational Nurse [phone]     CNP Questionnaire - 02/22/17 1809      Patient Demographics   Is this a new or existing patient? Existing   Patient is considered a/an Refugee   Race Asian     Patient Assistance   Location of Patient Assistance Not Applicable   Patient's financial/insurance status Medicare;Medicaid;Low Income   Uninsured Patient ([facility]) No   Patient referred to apply for the following financial assistance Not Applicable   Food insecurities addressed Not Applicable   Transportation assistance No   Assistance securing medications No   Educational health offerings Other     Encounter Details   Primary purpose of visit Other   Was an Emergency Department visit averted? Not Applicable   Does patient have a medical provider? Yes   Patient referred to Not Applicable   Was a mental health screening completed? (GAINS tool) No   Does patient have dental issues? No   Does patient have vision issues? No   Does your patient have an abnormal blood pressure today? No   Since previous encounter, have you referred patient for abnormal blood pressure that resulted in a new diagnosis or  medication change? No   Does your patient have an abnormal blood glucose today? No   Since previous encounter, have you referred patient for abnormal blood glucose that resulted in a new diagnosis or medication change? No   Was there a life-saving intervention made? No         Clinical Intake - 02/14/17 1439      Pain   Pain  0-10   Pain Score 7    Pain Type Chronic pain   Pain Location Back   Pain Orientation Lower   Pain Descriptors / Indicators Aching   Pain Onset Other (comment)   Pain Frequency Intermittent     Nutrition Screen   BMI - recorded 32.42   Nutritional Status BMI > 30  Obese   Nutritional Risks None   Diabetes No     Functional Status   Activities of Daily Living Independent   Ambulation Independent   Medication Administration Independent   Home Management Independent     Risk/Barriers   Barriers to Care Management & Learning None

## 2017-02-23 ENCOUNTER — Other Ambulatory Visit: Payer: Self-pay | Admitting: *Deleted

## 2017-02-24 MED ORDER — SODIUM BICARBONATE 650 MG PO TABS: 1300.0000 mg | ORAL_TABLET | Freq: Two times a day (BID) | ORAL | 2 refills | Status: DC

## 2017-02-28 ENCOUNTER — Encounter (HOSPITAL_COMMUNITY)
Admission: RE | Admit: 2017-02-28 | Discharge: 2017-02-28 | Disposition: A | Discharge status: Home / Self Care | Payer: Medicare Other | Source: Ambulatory Visit | Attending: Nephrology | Admitting: Nephrology

## 2017-02-28 DIAGNOSIS — N184 Chronic kidney disease, stage 4 (severe): Secondary | ICD-10-CM | POA: Diagnosis not present

## 2017-02-28 LAB — POCT HEMOGLOBIN-HEMACUE: Hemoglobin: 9.6 g/dL — ABNORMAL LOW (ref 13.0–17.0)

## 2017-02-28 MED ORDER — EPOETIN ALFA 10000 UNIT/ML IJ SOLN
10000.0000 [IU] | INTRAMUSCULAR | Status: DC
  Administered 2017-02-28: 10000 [IU] via SUBCUTANEOUS

## 2017-02-28 MED ORDER — EPOETIN ALFA 10000 UNIT/ML IJ SOLN
INTRAMUSCULAR | Status: AC
  Filled 2017-02-28: qty 1

## 2017-03-01 ENCOUNTER — Ambulatory Visit: Payer: Medicare Other | Attending: Internal Medicine | Admitting: Physical Therapy

## 2017-03-01 DIAGNOSIS — M25652 Stiffness of left hip, not elsewhere classified: Secondary | ICD-10-CM

## 2017-03-01 DIAGNOSIS — M25651 Stiffness of right hip, not elsewhere classified: Secondary | ICD-10-CM | POA: Diagnosis not present

## 2017-03-01 DIAGNOSIS — M5440 Lumbago with sciatica, unspecified side: Secondary | ICD-10-CM | POA: Insufficient documentation

## 2017-03-01 DIAGNOSIS — M6281 Muscle weakness (generalized): Secondary | ICD-10-CM

## 2017-03-01 DIAGNOSIS — G8929 Other chronic pain: Secondary | ICD-10-CM | POA: Diagnosis not present

## 2017-03-01 DIAGNOSIS — R293 Abnormal posture: Secondary | ICD-10-CM
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
## 2017-03-01 NOTE — Patient Instructions (Addendum)
Knee Roll    Lying on back, with knees bent and feet flat on floor, arms outstretched to sides, slowly roll both knees to side, hold 30 seconds. Back to starting point. Then to opposite side, hold 30 seconds. Return to starting position. Keep shoulders and arms in contact with floor.  2 x a day when lying down  [employer] lateral techique right hip into wall to counter right lateral lean.   3-4 times a day.   Copyright  VHI. All rights reserved.   [employer] handout for standing wall stretch with both arms up and then flexing right and then left arm for full back stretch  20 x   3-4 times a day   Heat Therapy Heat therapy can help make painful, stiff muscles and joints feel better. Do not use heat on new injuries. Wait at least 48 hours after an injury to use heat. Do not use heat when you have aches or pains right after an activity. If you still have pain 3 hours after stopping the activity, then you may use heat. HOME CARE Wet heat pack  Soak a clean towel in warm water. Squeeze out the extra water.  Put the warm, wet towel in a plastic bag.  Place a thin, dry towel between your skin and the bag.  Put the heat pack on the area for 5 minutes, and check your skin. Your skin may be pink, but it should not be red.  Leave the heat pack on the area for 15 to 30 minutes.  Repeat this every 2 to 4 hours while awake. Do not use heat while you are sleeping. Warm water bath  Fill a tub with warm water.  Place the affected body part in the tub.  Soak the area for 20 to 40 minutes.  Repeat as needed. Hot water bottle  Fill the water bottle half full with hot water.  Press out the extra air. Close the cap tightly.  Place a dry towel between your skin and the bottle.heat  Put the bottle on the area for 5 minutes, and check your skin. Your skin may be pink, but it should not be red.  Leave the bottle on the area for 15 to 30 minutes.  Repeat this every 2 to 4 hours while  awake. Electric heating pad  Place a dry towel between your skin and the heating pad.  Set the heating pad on low heat.  Put the heating pad on the area for 10 minutes, and check your skin. Your skin may be pink, but it should not be red.  Leave the heating pad on the area for 20 to 40 minutes.  Repeat this every 2 to 4 hours while awake.  Do not lie on the heating pad.  Do not fall asleep while using the heating pad.  Do not use the heating pad near water. GET HELP RIGHT AWAY IF:  You get blisters or red skin.  Your skin is puffy (swollen), or you lose feeling (numbness) in the affected area.  You have any new problems.  Your problems are getting worse.  You have any questions or concerns. If you have any problems, stop using heat therapy until you see your doctor. MAKE SURE YOU:  Understand these instructions.  Will watch your condition.  Will get help right away if you are not doing well or get worse. Document Released: 07/31/2011 Document Reviewed: 07/01/2013 [facility] Patient Information 2015 [place]. This information is not intended to  replace advice given to you by your health care provider. Make sure you discuss any questions you have with your health care provider.   Voncille Lo, PT Certified Exercise Expert for the Aging Adult  03/01/17 11:55 AM Phone: [phone] Fax: [phone]

## 2017-03-01 NOTE — Congregational Nurse Program (Signed)
Congregational Nurse Program Note  Date of Encounter: 03/01/2017  Past Medical History: Past Medical History:  Diagnosis Date  . Barrett's esophagus   . CKD (chronic kidney disease) stage 4, GFR 15-29 ml/min (HCC)    Followed by [facility].  Thought to be due to long-standing HTN  . Edema   . Essential hypertension   . GERD (gastroesophageal reflux disease)   . Gout 05/04/2009   Qualifier: Diagnosis of  By: Dyann Kief MD, Matthias Hughs Details:  Home visit to fill pill boxes for 2 weeks.  Patient stated that the Flexoril makes him vomit, even when taking it with food.  He is going to try taking 1(5mg ) tablet instead of the prescribed 1.5 tablets.  CN accompanied him to P.T. Evaluation at [facility].  He will go twice per week for therapy for low back pain.  CN will apply for Medicaid transportation for him.  [name] RN, Congregational Nurse [phone]     CNP Questionnaire - 03/01/17 1441      Patient Demographics   Is this a new or existing patient? Existing   Patient is considered a/an Refugee   Race Asian     Patient Assistance   Location of Patient Assistance Not Applicable   Patient's financial/insurance status Medicare;Medicaid;Low Income   Uninsured Patient ([facility]) No   Patient referred to apply for the following financial assistance Not Applicable   Food insecurities addressed Not Applicable   Transportation assistance No   Assistance securing medications No   Educational health offerings Other     Encounter Details   Primary purpose of visit Other   Was an Emergency Department visit averted? Not Applicable   Does patient have a medical provider? Yes   Patient referred to Not Applicable   Was a mental health screening completed? (GAINS tool) No   Does patient have dental issues? No   Does patient have vision issues? No   Does your patient have an abnormal blood pressure today? No   Since previous  encounter, have you referred patient for abnormal blood pressure that resulted in a new diagnosis or medication change? No   Does your patient have an abnormal blood glucose today? No   Since previous encounter, have you referred patient for abnormal blood glucose that resulted in a new diagnosis or medication change? No   Was there a life-saving intervention made? No         Clinical Intake - 02/14/17 1439      Pain   Pain  0-10   Pain Score 7    Pain Type Chronic pain   Pain Location Back   Pain Orientation Lower   Pain Descriptors / Indicators Aching   Pain Onset Other (comment)   Pain Frequency Intermittent     Nutrition Screen   BMI - recorded 32.42   Nutritional Status BMI > 30  Obese   Nutritional Risks None   Diabetes No     Functional Status   Activities of Daily Living Independent   Ambulation Independent   Medication Administration Independent   Home Management Independent     Risk/Barriers   Barriers to Care Management & Learning None

## 2017-03-08 ENCOUNTER — Encounter: Payer: Self-pay | Admitting: Physical Therapy

## 2017-03-08 ENCOUNTER — Ambulatory Visit: Payer: Medicare Other | Admitting: Physical Therapy

## 2017-03-08 DIAGNOSIS — M25651 Stiffness of right hip, not elsewhere classified: Secondary | ICD-10-CM | POA: Diagnosis not present

## 2017-03-08 DIAGNOSIS — M6281 Muscle weakness (generalized): Secondary | ICD-10-CM | POA: Diagnosis not present

## 2017-03-08 DIAGNOSIS — M5440 Lumbago with sciatica, unspecified side: Principal | ICD-10-CM

## 2017-03-08 DIAGNOSIS — R293 Abnormal posture: Secondary | ICD-10-CM | POA: Diagnosis not present

## 2017-03-08 DIAGNOSIS — G8929 Other chronic pain: Secondary | ICD-10-CM | POA: Diagnosis not present

## 2017-03-08 DIAGNOSIS — M25652 Stiffness of left hip, not elsewhere classified: Secondary | ICD-10-CM | POA: Diagnosis not present

## 2017-03-08 NOTE — Therapy (Signed)
[place], [place], [address] Phone: [phone]   Fax:  [phone]  Physical Therapy Treatment  Patient Details  Name: Luke Dunn MRN: [id number] Date of Birth: 01-Aug-1950 Referring Provider: Katherine Roan  Encounter Date: 03/08/2017      PT End of Session - 03/08/17 0937    Visit Number 2   Number of Visits 16   Date for PT Re-Evaluation 04/26/17   Authorization Type Medicare/Medicaid   PT Start Time 0932   PT Stop Time 7782   PT Time Calculation (min) 42 min   Activity Tolerance Patient tolerated treatment well   Behavior During Therapy [facility] for tasks assessed/performed      Past Medical History:  Diagnosis Date  . Barrett's esophagus   . CKD (chronic kidney disease) stage 4, GFR 15-29 ml/min (HCC)    Followed by [facility].  Thought to be due to long-standing HTN  . Edema   . Essential hypertension   . GERD (gastroesophageal reflux disease)   . Gout 05/04/2009   Qualifier: Diagnosis of  By: Dyann Kief MD, Clifton James      Past Surgical History:  Procedure Laterality Date  . AV FISTULA PLACEMENT Left 07/10/2014   Procedure: ARTERIOVENOUS (AV) FISTULA CREATION-left;  Surgeon: Serafina Mitchell, MD;  Location: [place];  Service: Vascular;  Laterality: Left;  . COLONOSCOPY N/A 01/16/2014   Procedure: COLONOSCOPY;  Surgeon: Beryle Beams, MD;  Location: [place];  Service: Endoscopy;  Laterality: N/A;  . ESOPHAGOGASTRODUODENOSCOPY N/A 01/16/2014   Procedure: ESOPHAGOGASTRODUODENOSCOPY (EGD);  Surgeon: Beryle Beams, MD;  Location: [facility] ENDOSCOPY;  Service: Endoscopy;  Laterality: N/A;    There were no vitals filed for this visit.      Subjective Assessment - 03/08/17 0934    Subjective "I am feeling alot better today and I am doing the exercises every day"   Currently in Pain? No/denies   Pain Score 0-No pain   Pain Location Back   Pain Orientation Left   Pain Descriptors / Indicators  Aching   Pain Type Chronic pain                         [facility] Adult PT Treatment/Exercise - 03/08/17 0937      Lumbar Exercises: Stretches   Active Hamstring Stretch 2 reps;30 seconds  bil   Lower Trunk Rotation --  1 x 10 holding 5 sec at end range     Lumbar Exercises: Aerobic   Stationary Bike Nu-Step L 5 x 6 min     Lumbar Exercises: Standing   Other Standing Lumbar Exercises repeated extension 2 x 12   Other Standing Lumbar Exercises standing hip abduction/ extension bil 2x 12 ea.     Lumbar Exercises: Seated   Sit to Stand 10 reps  x 2 sets   Sit to Stand Limitations verbal cues for proper form and to use both legs avoiding weight shifting to the R     Lumbar Exercises: Supine   Bent Knee Raise 10 reps;2 seconds  2 sets                PT Education - 03/08/17 1001    Education provided Yes   Education Details updated HEP and reviewed previously provided HEP   extra time need for translation   Person(s) Educated Patient   Methods Explanation;Verbal cues;Handout;Demonstration   Comprehension Verbalized understanding;Verbal cues required;Returned demonstration  PT Short Term Goals - 03/01/17 1217      PT SHORT TERM GOAL #1   Title "Independent with initial HEP   Time 4   Period Weeks   Status New   Target Date 03/29/17     PT SHORT TERM GOAL #2   Title "Report pain decrease from 6/10 to 3 /10 when transferring from supine to sit   Baseline Pt having difficulty with pain and every transition movement   Time 4   Period Weeks   Status New   Target Date 03/29/17     PT SHORT TERM GOAL #3   Title "Demonstrate understanding of proper sitting posture and be more conscious of position and posture throughout the day.    Time 4   Period Weeks   Status New     PT SHORT TERM GOAL #4   Title Pt will be able to bend over to put shoes on with 3/10 pain or less   Time 4   Period Weeks   Status New   Target Date 03/29/17            PT Long Term Goals - 03/01/17 1621      PT LONG TERM GOAL #1   Title "Demonstrate and verbalize techniques to reduce the risk of re-injury including: lifting, posture, body mechanics.    Time 8   Period Weeks   Status New   Target Date 04/26/17     PT LONG TERM GOAL #2   Title "Pt will be independent with advanced HEP.    Time 8   Period Weeks   Status New   Target Date 04/26/17     PT LONG TERM GOAL #3   Title "Pt will not wake due to pain while turning in bed while sleeping at night using postioning and pain management strategies   Time 8   Period Weeks   Status New   Target Date 04/26/17     PT LONG TERM GOAL #4   Title Pt will improve LE strength to at least 4/10 throughout bilaterally in order to perform household chores with greater ease   Time 8   Period Weeks   Status New   Target Date 04/26/17     PT LONG TERM GOAL #5   Title Pt will be able to bend down and don shoes and cut toenails without exacerbating pain   Time 8   Period Weeks   Status New   Target Date 04/26/17     PT LONG TERM GOAL #6   Title "Pt will tolerate sitting 1 hour without increased pain to ride in car without increased pain   Time 8   Period Weeks   Status New   Target Date 04/26/17               Plan - 03/08/17 1007    Clinical Impression Statement pt reports consistency with his HEP. continued working on core strengthening and hip/ knee strength in standing which pt required mutiple verbal/ visual cues for proper form. pt reported no pain at end of session. if pt continues to report no pain, plan to assess need for more visit and possibly discharge.   PT Next Visit Plan assess possible D/C if doing well ,modalities as needed.  core strengthening/ hip strengthening.    PT Home Exercise Plan standing back and UE slides , McKenzie lateral technique right hip into wall for right lean, standing hip abduction, extension, supine marching,  Consulted and Agree with Plan  of Care Patient      Patient will benefit from skilled therapeutic intervention in order to improve the following deficits and impairments:  Obesity, Pain, Postural dysfunction, Improper body mechanics, Impaired flexibility, Increased muscle spasms, Hypomobility, Decreased strength, Decreased range of motion, Decreased mobility  Visit Diagnosis: Chronic left-sided low back pain with sciatica, sciatica laterality unspecified  Abnormal posture  Muscle weakness (generalized)  Stiffness of right hip, not elsewhere classified  Stiffness of left hip, not elsewhere classified     Problem List Patient Active Problem List   Diagnosis Date Noted  . Toenail fungus 02/14/2017  . Back pain 01/23/2017  . Rash and nonspecific skin eruption 06/26/2016  . Dyspnea 01/10/2016  . Hyperglycemia 11/30/2014  . Bilateral leg edema 11/30/2014  . Osteoarthritis of both knees 05/27/2014  . Barrett's esophagus 02/03/2014  . Microcytic anemia 08/30/2012  . Gout due to renal impairment 05/04/2009  . Essential hypertension, benign 11/19/2008  . CKD (chronic kidney disease) stage 4, GFR 15-29 ml/min (HCC) 11/19/2008   Starr Lake PT, DPT, LAT, ATC  03/08/17  10:15 AM      [facility] [address] [place], [place], [address] Phone: [phone]   Fax:  [phone]  Name: Luke Dunn MRN: [id number] Date of Birth: Jun 05, 1950

## 2017-03-13 ENCOUNTER — Ambulatory Visit: Payer: Medicare Other

## 2017-03-13 DIAGNOSIS — M25652 Stiffness of left hip, not elsewhere classified: Secondary | ICD-10-CM

## 2017-03-13 DIAGNOSIS — G8929 Other chronic pain: Secondary | ICD-10-CM | POA: Diagnosis not present

## 2017-03-13 DIAGNOSIS — M25651 Stiffness of right hip, not elsewhere classified: Secondary | ICD-10-CM | POA: Diagnosis not present

## 2017-03-13 DIAGNOSIS — R293 Abnormal posture: Secondary | ICD-10-CM

## 2017-03-13 DIAGNOSIS — M6281 Muscle weakness (generalized): Secondary | ICD-10-CM | POA: Diagnosis not present

## 2017-03-13 DIAGNOSIS — M5440 Lumbago with sciatica, unspecified side: Secondary | ICD-10-CM | POA: Diagnosis not present

## 2017-03-13 NOTE — Therapy (Signed)
[place], [place], [address] Phone: [phone]   Fax:  [phone]  Physical Therapy Treatment/Discharge  Patient Details  Name: Josia Cueva MRN: [id number] Date of Birth: 06-May-1951 Referring Provider: Katherine Roan  Encounter Date: 03/13/2017      PT End of Session - 03/13/17 1015    Visit Number 3   Number of Visits 16   Date for PT Re-Evaluation 04/26/17   Authorization Type Medicare/Medicaid   PT Start Time 4356   PT Stop Time 1053   PT Time Calculation (min) 38 min   Activity Tolerance Patient tolerated treatment well   Behavior During Therapy [facility] for tasks assessed/performed      Past Medical History:  Diagnosis Date  . Barrett's esophagus   . CKD (chronic kidney disease) stage 4, GFR 15-29 ml/min (HCC)    Followed by [facility].  Thought to be due to long-standing HTN  . Edema   . Essential hypertension   . GERD (gastroesophageal reflux disease)   . Gout 05/04/2009   Qualifier: Diagnosis of  By: Dyann Kief MD, Clifton James      Past Surgical History:  Procedure Laterality Date  . AV FISTULA PLACEMENT Left 07/10/2014   Procedure: ARTERIOVENOUS (AV) FISTULA CREATION-left;  Surgeon: Serafina Mitchell, MD;  Location: [place];  Service: Vascular;  Laterality: Left;  . COLONOSCOPY N/A 01/16/2014   Procedure: COLONOSCOPY;  Surgeon: Beryle Beams, MD;  Location: [place];  Service: Endoscopy;  Laterality: N/A;  . ESOPHAGOGASTRODUODENOSCOPY N/A 01/16/2014   Procedure: ESOPHAGOGASTRODUODENOSCOPY (EGD);  Surgeon: Beryle Beams, MD;  Location: [facility] ENDOSCOPY;  Service: Endoscopy;  Laterality: N/A;    There were no vitals filed for this visit.      Subjective Assessment - 03/13/17 1015    Subjective No pain . Doing HEP and it is helping.    Patient is accompained by: Interpreter   Currently in Pain? No/denies                         [facility] Adult PT Treatment/Exercise - 03/13/17  0001      Self-Care   Self-Care Lifting   Lifting Lifting and body mechanics with demo and verbal instructions and information about load to spine and pysics of incr load with distance from center of body   Posture sitting with support                PT Education - 03/13/17 1041    Education provided Yes   Education Details lifting and body mechanics   Person(s) Educated Patient   Methods Handout;Verbal cues;Demonstration;Explanation   Comprehension Verbalized understanding;Returned demonstration          PT Short Term Goals - 03/13/17 1019      PT SHORT TERM GOAL #1   Title "Independent with initial HEP   Status Achieved     PT SHORT TERM GOAL #2   Title "Report pain decrease from 6/10 to 3 /10 when transferring from supine to sit   Status Achieved     PT SHORT TERM GOAL #3   Title "Demonstrate understanding of proper sitting posture and be more conscious of position and posture throughout the day.    Status Achieved     PT SHORT TERM GOAL #4   Title Pt will be able to bend over to put shoes on with 3/10 pain or less   Status Achieved  PT Long Term Goals - 03-23-2017 1021      PT LONG TERM GOAL #1   Title "Demonstrate and verbalize techniques to reduce the risk of re-injury including: lifting, posture, body mechanics.    Status Achieved     PT LONG TERM GOAL #2   Title "Pt will be independent with advanced HEP.    Status Achieved     PT LONG TERM GOAL #3   Title "Pt will not wake due to pain while turning in bed while sleeping at night using postioning and pain management strategies   Status Achieved     PT LONG TERM GOAL #4   Title Pt will improve LE strength to at least 4/10 throughout bilaterally in order to perform household chores with greater ease   Status Deferred     PT LONG TERM GOAL #5   Title Pt will be able to bend down and don shoes and cut toenails without exacerbating pain   Status Achieved     PT LONG TERM GOAL #6    Title "Pt will tolerate sitting 1 hour without increased pain to ride in car without increased pain   Status Achieved               Plan - 03/23/2017 1015    Clinical Impression Statement Mr Urey is pain free and doing EP . He was instructred in body mechanics and was able to demo decent mechanics though was not able to flex knees with squat so did more of hip hinge.   He is reportly doing all activity without pain.    He stated he is ready for discharge   PT Treatment/Interventions Traction;Moist Heat;ADLs/Self Care Home Management;Cryotherapy;Electrical Stimulation;Iontophoresis '4mg'$ /ml Dexamethasone;Functional mobility training;Stair training;Gait training;Taping;Dry needling;Passive range of motion;Manual techniques;Therapeutic exercise;Therapeutic activities;Neuromuscular re-education;Patient/family education   PT Next Visit Plan Discharge today.    PT Home Exercise Plan standing back and UE slides , McKenzie lateral technique right hip into wall for right lean, standing hip abduction, extension, supine marching, Body mechanics and posture   Consulted and Agree with Plan of Care Patient      Patient will benefit from skilled therapeutic intervention in order to improve the following deficits and impairments:  Obesity, Pain, Postural dysfunction, Improper body mechanics, Impaired flexibility, Increased muscle spasms, Hypomobility, Decreased strength, Decreased range of motion, Decreased mobility  Visit Diagnosis: Chronic left-sided low back pain with sciatica, sciatica laterality unspecified  Abnormal posture  Muscle weakness (generalized)  Stiffness of right hip, not elsewhere classified  Stiffness of left hip, not elsewhere classified       G-Codes - 03-23-17 1045    Functional Assessment Tool Used (Outpatient Only) Clinical Judgement   Functional Limitation Changing and maintaining body position   Changing and Maintaining Body Position Goal Status (N8295) At least 40  percent but less than 60 percent impaired, limited or restricted   Changing and Maintaining Body Position Discharge Status (A2130) 0 percent impaired, limited or restricted      Problem List Patient Active Problem List   Diagnosis Date Noted  . Toenail fungus 02/14/2017  . Back pain 01/23/2017  . Rash and nonspecific skin eruption 06/26/2016  . Dyspnea 01/10/2016  . Hyperglycemia 11/30/2014  . Bilateral leg edema 11/30/2014  . Osteoarthritis of both knees 05/27/2014  . Barrett's esophagus 02/03/2014  . Microcytic anemia 08/30/2012  . Gout due to renal impairment 05/04/2009  . Essential hypertension, benign 11/19/2008  . CKD (chronic kidney disease) stage 4, GFR 15-29 ml/min (HCC) 11/19/2008  Darrel Hoover  PT 03/13/2017, 10:50 AM  [facility] [address] [place], [place], [address] Phone: [phone]   Fax:  [phone]  Name: Colbie Danner MRN: [id number] Date of Birth: Mar 31, 1951  PHYSICAL THERAPY DISCHARGE SUMMARY  Visits from Start of Care: 3  Current functional level related to goals / functional outcomes: See above    Remaining deficits: None   Education / Equipment: HEP, lifting mechanics Plan: Patient agrees to discharge.  Patient goals were met. Patient is being discharged due to meeting the stated rehab goals.  ?????    Noralee Stain  PT  03/13/17   10:51 AM

## 2017-03-13 NOTE — Patient Instructions (Signed)

## 2017-03-14 ENCOUNTER — Encounter (HOSPITAL_COMMUNITY)
Admission: RE | Admit: 2017-03-14 | Discharge: 2017-03-14 | Disposition: A | Discharge status: Home / Self Care | Payer: Medicare Other | Source: Ambulatory Visit | Attending: Nephrology | Admitting: Nephrology

## 2017-03-14 DIAGNOSIS — N184 Chronic kidney disease, stage 4 (severe): Secondary | ICD-10-CM

## 2017-03-14 LAB — FERRITIN: Ferritin: 184 ng/mL (ref 24–336)

## 2017-03-14 LAB — POCT HEMOGLOBIN-HEMACUE: HEMOGLOBIN: 9.5 g/dL — AB (ref 13.0–17.0)

## 2017-03-14 LAB — IRON AND TIBC
Iron: 51 ug/dL (ref 45–182)
SATURATION RATIOS: 18 % (ref 17.9–39.5)
TIBC: 291 ug/dL (ref 250–450)
UIBC: 240 ug/dL

## 2017-03-14 MED ORDER — EPOETIN ALFA 10000 UNIT/ML IJ SOLN
INTRAMUSCULAR | Status: AC
  Filled 2017-03-14: qty 1

## 2017-03-14 MED ORDER — EPOETIN ALFA 10000 UNIT/ML IJ SOLN
10000.0000 [IU] | INTRAMUSCULAR | Status: DC
  Administered 2017-03-14: 14:00:00 10000 [IU] via SUBCUTANEOUS

## 2017-03-14 NOTE — Congregational Nurse Program (Signed)
Congregational Nurse Program Note  Date of Encounter: 03/14/2017  Past Medical History: Past Medical History:  Diagnosis Date  . Barrett's esophagus   . CKD (chronic kidney disease) stage 4, GFR 15-29 ml/min (HCC)    Followed by [facility].  Thought to be due to long-standing HTN  . Edema   . Essential hypertension   . GERD (gastroesophageal reflux disease)   . Gout 05/04/2009   Qualifier: Diagnosis of  By: Dyann Kief MD, Matthias Hughs Details:  Home visit to fill pill boxes for 2 weeks.  Patient states he is able to walk much better and his back is no longer hurting. He continues to do PT at home. [name] RN, Congregational Nurse [phone]     CNP Questionnaire - 03/14/17 2051      Patient Demographics   Is this a new or existing patient? Existing   Patient is considered a/an Refugee   Race Asian     Patient Assistance   Location of Patient Assistance Not Applicable   Patient's financial/insurance status Medicare;Medicaid;Low Income   Uninsured Patient ([facility]) No   Patient referred to apply for the following financial assistance Not Applicable   Food insecurities addressed Not Applicable   Transportation assistance No   Assistance securing medications No   Educational health offerings Chronic disease;Medications     Encounter Details   Primary purpose of visit Other;Chronic Illness/Condition Visit   Was an Emergency Department visit averted? Not Applicable   Does patient have a medical provider? Yes   Patient referred to Not Applicable   Was a mental health screening completed? (GAINS tool) No   Does patient have dental issues? No   Does patient have vision issues? No   Does your patient have an abnormal blood pressure today? No   Since previous encounter, have you referred patient for abnormal blood pressure that resulted in a new diagnosis or medication change? No   Does your patient have an abnormal blood glucose today? No   Since previous encounter, have you referred patient for abnormal blood glucose that resulted in a new diagnosis or medication change? No   Was there a life-saving intervention made? No

## 2017-03-14 NOTE — Discharge Instructions (Signed)
Epoetin Alfa injection °What is this medicine? °EPOETIN ALFA (e POE e tin AL fa) helps your body make more red blood cells. This medicine is used to treat anemia caused by chronic kidney failure, cancer chemotherapy, or HIV-therapy. It may also be used before surgery if you have anemia. °This medicine may be used for other purposes; ask your health care provider or pharmacist if you have questions. °COMMON BRAND NAME(S): Epogen, Procrit °What should I tell my health care provider before I take this medicine? °They need to know if you have any of these conditions: °-blood clotting disorders °-cancer patient not on chemotherapy °-cystic fibrosis °-heart disease, such as angina or heart failure °-hemoglobin level of 12 g/dL or greater °-high blood pressure °-low levels of folate, iron, or vitamin B12 °-seizures °-an unusual or allergic reaction to erythropoietin, albumin, benzyl alcohol, hamster proteins, other medicines, foods, dyes, or preservatives °-pregnant or trying to get pregnant °-breast-feeding °How should I use this medicine? °This medicine is for injection into a vein or under the skin. It is usually given by a health care professional in a hospital or clinic setting. °If you get this medicine at home, you will be taught how to prepare and give this medicine. Use exactly as directed. Take your medicine at regular intervals. Do not take your medicine more often than directed. °It is important that you put your used needles and syringes in a special sharps container. Do not put them in a trash can. If you do not have a sharps container, call your pharmacist or healthcare provider to get one. °A special MedGuide will be given to you by the pharmacist with each prescription and refill. Be sure to read this information carefully each time. °Talk to your pediatrician regarding the use of this medicine in children. While this drug may be prescribed for selected conditions, precautions do apply. °Overdosage: If you  think you have taken too much of this medicine contact a poison control center or emergency room at once. °NOTE: This medicine is only for you. Do not share this medicine with others. °What if I miss a dose? °If you miss a dose, take it as soon as you can. If it is almost time for your next dose, take only that dose. Do not take double or extra doses. °What may interact with this medicine? °Do not take this medicine with any of the following medications: °-darbepoetin alfa °This list may not describe all possible interactions. Give your health care provider a list of all the medicines, herbs, non-prescription drugs, or dietary supplements you use. Also tell them if you smoke, drink alcohol, or use illegal drugs. Some items may interact with your medicine. °What should I watch for while using this medicine? °Your condition will be monitored carefully while you are receiving this medicine. °You may need blood work done while you are taking this medicine. °What side effects may I notice from receiving this medicine? °Side effects that you should report to your doctor or health care professional as soon as possible: °-allergic reactions like skin rash, itching or hives, swelling of the face, lips, or tongue °-breathing problems °-changes in vision °-chest pain °-confusion, trouble speaking or understanding °-feeling faint or lightheaded, falls °-high blood pressure °-muscle aches or pains °-pain, swelling, warmth in the leg °-rapid weight gain °-severe headaches °-sudden numbness or weakness of the face, arm or leg °-trouble walking, dizziness, loss of balance or coordination °-seizures (convulsions) °-swelling of the ankles, feet, hands °-unusually weak or tired °  Side effects that usually do not require medical attention (report to your doctor or health care professional if they continue or are bothersome): °-diarrhea °-fever, chills (flu-like symptoms) °-headaches °-nausea, vomiting °-redness, stinging, or swelling at  site where injected °This list may not describe all possible side effects. Call your doctor for medical advice about side effects. You may report side effects to FDA at 1-800-FDA-1088. °Where should I keep my medicine? °Keep out of the reach of children. °Store in a refrigerator between 2 and 8 degrees C (36 and 46 degrees F). Do not freeze or shake. Throw away any unused portion if using a single-dose vial. Multi-dose vials can be kept in the refrigerator for up to 21 days after the initial dose. Throw away unused medicine. °NOTE: This sheet is a summary. It may not cover all possible information. If you have questions about this medicine, talk to your doctor, pharmacist, or health care provider. °© 2018 Elsevier/Gold Standard (2015-12-27 19:42:31) ° °

## 2017-03-15 ENCOUNTER — Ambulatory Visit: Payer: Medicare Other | Admitting: Physical Therapy

## 2017-03-20 ENCOUNTER — Encounter: Payer: Medicare Other | Admitting: Physical Therapy

## 2017-03-22 ENCOUNTER — Encounter: Payer: Medicare Other | Admitting: Physical Therapy

## 2017-03-23 ENCOUNTER — Other Ambulatory Visit: Payer: Self-pay | Admitting: *Deleted

## 2017-03-23 MED ORDER — FEBUXOSTAT 40 MG PO TABS: 80.0000 mg | ORAL_TABLET | Freq: Every day | ORAL | 2 refills | Status: DC

## 2017-03-27 ENCOUNTER — Encounter: Payer: Medicare Other | Admitting: Physical Therapy

## 2017-03-28 ENCOUNTER — Encounter (HOSPITAL_COMMUNITY)
Admission: RE | Admit: 2017-03-28 | Discharge: 2017-03-28 | Disposition: A | Discharge status: Home / Self Care | Payer: Medicare Other | Source: Ambulatory Visit | Attending: Nephrology | Admitting: Nephrology

## 2017-03-28 VITALS — BP 144/66 | HR 97 | Temp 97.4°F | Resp 18

## 2017-03-28 DIAGNOSIS — N184 Chronic kidney disease, stage 4 (severe): Secondary | ICD-10-CM | POA: Diagnosis not present

## 2017-03-28 LAB — POCT HEMOGLOBIN-HEMACUE: Hemoglobin: 9.7 g/dL — ABNORMAL LOW (ref 13.0–17.0)

## 2017-03-28 MED ORDER — EPOETIN ALFA 10000 UNIT/ML IJ SOLN: 10000.0000 [IU] | INTRAMUSCULAR | Status: DC

## 2017-03-28 MED ORDER — EPOETIN ALFA 10000 UNIT/ML IJ SOLN
INTRAMUSCULAR | Status: AC
  Administered 2017-03-28: 14:00:00 10000 [IU]
  Filled 2017-03-28: qty 1

## 2017-03-29 ENCOUNTER — Encounter: Payer: Medicare Other | Admitting: Physical Therapy

## 2017-04-03 ENCOUNTER — Encounter: Payer: Medicare Other | Admitting: Physical Therapy

## 2017-04-05 ENCOUNTER — Encounter: Payer: Medicare Other | Admitting: Physical Therapy

## 2017-04-10 ENCOUNTER — Encounter (HOSPITAL_COMMUNITY): Payer: Medicare Other

## 2017-04-11 ENCOUNTER — Encounter (HOSPITAL_COMMUNITY)
Admission: RE | Admit: 2017-04-11 | Discharge: 2017-04-11 | Disposition: A | Discharge status: Home / Self Care | Payer: Medicare Other | Source: Ambulatory Visit | Attending: Nephrology | Admitting: Nephrology

## 2017-04-11 VITALS — BP 122/69 | HR 94 | Temp 97.6°F | Resp 18

## 2017-04-11 DIAGNOSIS — N184 Chronic kidney disease, stage 4 (severe): Secondary | ICD-10-CM | POA: Diagnosis not present

## 2017-04-11 LAB — POCT HEMOGLOBIN-HEMACUE: Hemoglobin: 10.3 g/dL — ABNORMAL LOW (ref 13.0–17.0)

## 2017-04-11 LAB — IRON AND TIBC
IRON: 55 ug/dL (ref 45–182)
SATURATION RATIOS: 18 % (ref 17.9–39.5)
TIBC: 298 ug/dL (ref 250–450)
UIBC: 243 ug/dL

## 2017-04-11 LAB — FERRITIN: Ferritin: 152 ng/mL (ref 24–336)

## 2017-04-11 MED ORDER — EPOETIN ALFA 10000 UNIT/ML IJ SOLN
INTRAMUSCULAR | Status: AC
  Filled 2017-04-11: qty 1

## 2017-04-11 MED ORDER — EPOETIN ALFA 10000 UNIT/ML IJ SOLN
10000.0000 [IU] | INTRAMUSCULAR | Status: DC
  Administered 2017-04-11: 14:00:00 10000 [IU] via SUBCUTANEOUS

## 2017-04-11 NOTE — Congregational Nurse Program (Signed)
Congregational Nurse Program Note  Date of Encounter: 04/11/2017  Past Medical History: Past Medical History:  Diagnosis Date  . Barrett's esophagus   . CKD (chronic kidney disease) stage 4, GFR 15-29 ml/min (HCC)    Followed by [facility].  Thought to be due to long-standing HTN  . Edema   . Essential hypertension   . GERD (gastroesophageal reflux disease)   . Gout 05/04/2009   Qualifier: Diagnosis of  By: Dyann Kief MD, Matthias Hughs Details:  Home visit.  Filled pill boxes for 2 weeks.  [name] RN, Congregational Nurse [phone] CNP Questionnaire - 04/11/17 2148      Questionnaire   Patient Status  Refugee    Race  Asian    Location Patient Served At  Not Applicable    Insurance  Medicaid;Medicare    Uninsured  Not Applicable    Food  No food insecurities    Housing/Utilities  Yes, have permanent housing    Transportation  No transportation needs    Interpersonal Safety  Yes, feel physically and emotionally safe where you currently live    Medication  No medication insecurities    Medical Provider  Yes    Referrals  Not Applicable    ED Visit Averted  Not Applicable    Life-Saving Intervention Made  Not Applicable

## 2017-04-23 ENCOUNTER — Other Ambulatory Visit: Payer: Self-pay | Admitting: Internal Medicine

## 2017-04-25 ENCOUNTER — Encounter (HOSPITAL_COMMUNITY)
Admission: RE | Admit: 2017-04-25 | Discharge: 2017-04-25 | Disposition: A | Discharge status: Home / Self Care | Payer: Medicare Other | Source: Ambulatory Visit | Attending: Nephrology | Admitting: Nephrology

## 2017-04-25 VITALS — BP 147/67 | HR 87 | Resp 18

## 2017-04-25 DIAGNOSIS — N184 Chronic kidney disease, stage 4 (severe): Secondary | ICD-10-CM | POA: Diagnosis not present

## 2017-04-25 MED ORDER — EPOETIN ALFA 10000 UNIT/ML IJ SOLN
INTRAMUSCULAR | Status: AC
  Administered 2017-04-25: 14:00:00 10000 [IU] via SUBCUTANEOUS
  Filled 2017-04-25: qty 1

## 2017-04-25 MED ORDER — EPOETIN ALFA 10000 UNIT/ML IJ SOLN
10000.0000 [IU] | INTRAMUSCULAR | Status: DC
  Administered 2017-04-25: 10000 [IU] via SUBCUTANEOUS

## 2017-04-25 NOTE — Congregational Nurse Program (Signed)
Congregational Nurse Program Note  Date of Encounter: 04/25/2017  Past Medical History: Past Medical History:  Diagnosis Date  . Barrett's esophagus   . CKD (chronic kidney disease) stage 4, GFR 15-29 ml/min (HCC)    Followed by [facility].  Thought to be due to long-standing HTN  . Edema   . Essential hypertension   . GERD (gastroesophageal reflux disease)   . Gout 05/04/2009   Qualifier: Diagnosis of  By: Dyann Kief MD, Matthias Hughs Details: Home visit to deliver medications that were picked up from [facility] and to fill pill boxes for 2 weeks.  [name] RN,  Congregational Nurse [phone] CNP Questionnaire - 04/25/17 1709      Questionnaire   Patient Status  Refugee    Race  Asian    Location Patient Served At  Not Applicable    Insurance  Medicaid;Medicare    Uninsured  Not Applicable    Food  No food insecurities    Housing/Utilities  Yes, have permanent housing    Transportation  No transportation needs    Interpersonal Safety  Yes, feel physically and emotionally safe where you currently live    Medication  No medication insecurities    Medical Provider  Yes    Referrals  Not Applicable    ED Visit Averted  Not Applicable    Life-Saving Intervention Made  Not Applicable

## 2017-04-26 LAB — POCT HEMOGLOBIN-HEMACUE: HEMOGLOBIN: 11.5 g/dL — AB (ref 13.0–17.0)

## 2017-04-30 ENCOUNTER — Encounter: Payer: Medicare Other | Admitting: Internal Medicine

## 2017-05-09 ENCOUNTER — Encounter (HOSPITAL_COMMUNITY)
Admission: RE | Admit: 2017-05-09 | Discharge: 2017-05-09 | Disposition: A | Discharge status: Home / Self Care | Payer: Medicare Other | Source: Ambulatory Visit | Attending: Nephrology | Admitting: Nephrology

## 2017-05-09 VITALS — BP 121/73 | HR 94 | Temp 98.6°F | Resp 16

## 2017-05-09 DIAGNOSIS — N184 Chronic kidney disease, stage 4 (severe): Secondary | ICD-10-CM

## 2017-05-09 LAB — IRON AND TIBC
IRON: 81 ug/dL (ref 45–182)
Saturation Ratios: 28 % (ref 17.9–39.5)
TIBC: 294 ug/dL (ref 250–450)
UIBC: 213 ug/dL

## 2017-05-09 LAB — POCT HEMOGLOBIN-HEMACUE: Hemoglobin: 10.6 g/dL — ABNORMAL LOW (ref 13.0–17.0)

## 2017-05-09 LAB — FERRITIN: FERRITIN: 126 ng/mL (ref 24–336)

## 2017-05-09 MED ORDER — EPOETIN ALFA 10000 UNIT/ML IJ SOLN
10000.0000 [IU] | INTRAMUSCULAR | Status: DC
Start: 2017-05-09 — End: 2017-05-10
  Administered 2017-05-09: 10000 [IU] via SUBCUTANEOUS

## 2017-05-09 MED ORDER — EPOETIN ALFA 10000 UNIT/ML IJ SOLN
INTRAMUSCULAR | Status: AC
  Filled 2017-05-09: qty 1

## 2017-05-10 DIAGNOSIS — N183 Chronic kidney disease, stage 3 (moderate): Secondary | ICD-10-CM | POA: Diagnosis not present

## 2017-05-10 DIAGNOSIS — E785 Hyperlipidemia, unspecified: Secondary | ICD-10-CM | POA: Diagnosis not present

## 2017-05-10 DIAGNOSIS — D631 Anemia in chronic kidney disease: Secondary | ICD-10-CM | POA: Diagnosis not present

## 2017-05-10 DIAGNOSIS — Z6837 Body mass index (BMI) 37.0-37.9, adult: Secondary | ICD-10-CM | POA: Diagnosis not present

## 2017-05-10 DIAGNOSIS — N2581 Secondary hyperparathyroidism of renal origin: Secondary | ICD-10-CM | POA: Diagnosis not present

## 2017-05-10 NOTE — Congregational Nurse Program (Signed)
Congregational Nurse Program Note  Date of Encounter: 05/10/2017  Past Medical History: Past Medical History:  Diagnosis Date  . Barrett's esophagus   . CKD (chronic kidney disease) stage 4, GFR 15-29 ml/min (HCC)    Followed by [facility].  Thought to be due to long-standing HTN  . Edema   . Essential hypertension   . GERD (gastroesophageal reflux disease)   . Gout 05/04/2009   Qualifier: Diagnosis of  By: Dyann Kief MD, Matthias Hughs Details:Home Visit.  Pill boxes filled for 2 weeks. CNP Questionnaire - 05/10/17 1957      Questionnaire   Patient Status  Refugee    Race  Asian    Location Patient Served At  Not Applicable    Insurance  Medicaid;Medicare    Uninsured  Not Applicable    Food  No food insecurities    Housing/Utilities  Yes, have permanent housing    Transportation  No transportation needs    Interpersonal Safety  Yes, feel physically and emotionally safe where you currently live    Medication  No medication insecurities    Medical Provider  Yes    Referrals  Not Applicable    ED Visit Averted  Not Applicable    Life-Saving Intervention Made  Not Applicable

## 2017-05-21 ENCOUNTER — Other Ambulatory Visit (HOSPITAL_COMMUNITY): Payer: Self-pay | Admitting: *Deleted

## 2017-05-23 ENCOUNTER — Encounter (HOSPITAL_COMMUNITY)
Admission: RE | Admit: 2017-05-23 | Discharge: 2017-05-23 | Disposition: A | Discharge status: Home / Self Care | Payer: Medicare Other | Source: Ambulatory Visit | Attending: Nephrology | Admitting: Nephrology

## 2017-05-23 VITALS — BP 140/79 | HR 90 | Temp 97.7°F | Resp 18

## 2017-05-23 DIAGNOSIS — N184 Chronic kidney disease, stage 4 (severe): Secondary | ICD-10-CM | POA: Diagnosis not present

## 2017-05-23 LAB — POCT HEMOGLOBIN-HEMACUE: Hemoglobin: 10.3 g/dL — ABNORMAL LOW (ref 13.0–17.0)

## 2017-05-23 MED ORDER — EPOETIN ALFA 10000 UNIT/ML IJ SOLN
INTRAMUSCULAR | Status: AC
  Administered 2017-05-23: 14:00:00 10000 [IU] via SUBCUTANEOUS
  Filled 2017-05-23: qty 1

## 2017-05-23 MED ORDER — EPOETIN ALFA 10000 UNIT/ML IJ SOLN
10000.0000 [IU] | INTRAMUSCULAR | Status: DC
  Administered 2017-05-23: 10000 [IU] via SUBCUTANEOUS

## 2017-05-23 NOTE — Congregational Nurse Program (Signed)
Congregational Nurse Program Note  Date of Encounter: 05/23/2017  Past Medical History: Past Medical History:  Diagnosis Date  . Barrett's esophagus   . CKD (chronic kidney disease) stage 4, GFR 15-29 ml/min (HCC)    Followed by [facility].  Thought to be due to long-standing HTN  . Edema   . Essential hypertension   . GERD (gastroesophageal reflux disease)   . Gout 05/04/2009   Qualifier: Diagnosis of  By: Dyann Kief MD, Matthias Hughs Details:  Home Visit.  Filled pill boxes for 2 weeks.  Will call in medication refill requests for Amlodipine, Sodium Bicarb, Uloric and Furosemide.  [name] RN, Congregational Nurse [phone] CNP Questionnaire - 05/23/17 1715      Questionnaire   Patient Status  Refugee    Race  Asian    Location Patient Served At  Not Applicable    Insurance  Medicaid;Medicare    Uninsured  Not Applicable    Food  No food insecurities    Housing/Utilities  Yes, have permanent housing    Transportation  No transportation needs    Interpersonal Safety  Yes, feel physically and emotionally safe where you currently live    Medication  No medication insecurities    Medical Provider  Yes    Referrals  Not Applicable    ED Visit Averted  Not Applicable    Life-Saving Intervention Made  Not Applicable

## 2017-05-28 ENCOUNTER — Other Ambulatory Visit: Payer: Self-pay | Admitting: Internal Medicine

## 2017-06-06 ENCOUNTER — Encounter (HOSPITAL_COMMUNITY)
Admission: RE | Admit: 2017-06-06 | Discharge: 2017-06-06 | Disposition: A | Discharge status: Home / Self Care | Payer: Medicare Other | Source: Ambulatory Visit | Attending: Nephrology | Admitting: Nephrology

## 2017-06-06 VITALS — BP 130/67 | HR 93 | Temp 98.0°F | Resp 18

## 2017-06-06 DIAGNOSIS — N184 Chronic kidney disease, stage 4 (severe): Secondary | ICD-10-CM

## 2017-06-06 LAB — POCT HEMOGLOBIN-HEMACUE: Hemoglobin: 10.5 g/dL — ABNORMAL LOW (ref 13.0–17.0)

## 2017-06-06 LAB — IRON AND TIBC
Iron: 79 ug/dL (ref 45–182)
SATURATION RATIOS: 27 % (ref 17.9–39.5)
TIBC: 290 ug/dL (ref 250–450)
UIBC: 211 ug/dL

## 2017-06-06 LAB — FERRITIN: FERRITIN: 159 ng/mL (ref 24–336)

## 2017-06-06 MED ORDER — EPOETIN ALFA 10000 UNIT/ML IJ SOLN
INTRAMUSCULAR | Status: AC
  Administered 2017-06-06: 10000 [IU] via SUBCUTANEOUS
  Filled 2017-06-06: qty 1

## 2017-06-06 MED ORDER — EPOETIN ALFA 10000 UNIT/ML IJ SOLN
10000.0000 [IU] | INTRAMUSCULAR | Status: DC
  Administered 2017-06-06: 10000 [IU] via SUBCUTANEOUS

## 2017-06-06 NOTE — Congregational Nurse Program (Signed)
Congregational Nurse Program Note  Date of Encounter: 06/06/2017  Past Medical History: Past Medical History:  Diagnosis Date  . Barrett's esophagus   . CKD (chronic kidney disease) stage 4, GFR 15-29 ml/min (HCC)    Followed by [facility].  Thought to be due to long-standing HTN  . Edema   . Essential hypertension   . GERD (gastroesophageal reflux disease)   . Gout 05/04/2009   Qualifier: Diagnosis of  By: Dyann Kief MD, Matthias Hughs Details:  Home visit.  Pill boxes filled for 2 weeks.  [name] RN, Congregational Nurse [phone] CNP Questionnaire - 06/06/17 2040      Questionnaire   Patient Status  Refugee    Race  Asian    Location Patient Served At  Not Applicable    Insurance  Medicaid;Medicare    Uninsured  Not Applicable    Food  No food insecurities    Housing/Utilities  Yes, have permanent housing    Transportation  No transportation needs    Interpersonal Safety  Yes, feel physically and emotionally safe where you currently live    Medication  No medication insecurities    Medical Provider  Yes    Referrals  Not Applicable    ED Visit Averted  Not Applicable    Life-Saving Intervention Made  Not Applicable

## 2017-06-19 ENCOUNTER — Encounter (HOSPITAL_COMMUNITY)
Admission: RE | Admit: 2017-06-19 | Discharge: 2017-06-19 | Disposition: A | Discharge status: Home / Self Care | Payer: Medicare Other | Source: Ambulatory Visit | Attending: Nephrology | Admitting: Nephrology

## 2017-06-19 VITALS — BP 145/72 | HR 94 | Temp 97.5°F | Resp 18

## 2017-06-19 DIAGNOSIS — N184 Chronic kidney disease, stage 4 (severe): Secondary | ICD-10-CM

## 2017-06-19 LAB — POCT HEMOGLOBIN-HEMACUE: Hemoglobin: 10.5 g/dL — ABNORMAL LOW (ref 13.0–17.0)

## 2017-06-19 MED ORDER — EPOETIN ALFA 10000 UNIT/ML IJ SOLN
10000.0000 [IU] | INTRAMUSCULAR | Status: DC
  Administered 2017-06-19: 10000 [IU] via SUBCUTANEOUS

## 2017-06-19 MED ORDER — EPOETIN ALFA 10000 UNIT/ML IJ SOLN
INTRAMUSCULAR | Status: AC
  Administered 2017-06-19: 14:00:00 10000 [IU] via SUBCUTANEOUS
  Filled 2017-06-19: qty 1

## 2017-07-03 ENCOUNTER — Encounter (HOSPITAL_COMMUNITY)
Admission: RE | Admit: 2017-07-03 | Discharge: 2017-07-03 | Disposition: A | Discharge status: Home / Self Care | Payer: Medicare Other | Source: Ambulatory Visit | Attending: Nephrology | Admitting: Nephrology

## 2017-07-03 VITALS — BP 142/77 | HR 96 | Temp 98.3°F | Resp 18

## 2017-07-03 DIAGNOSIS — N184 Chronic kidney disease, stage 4 (severe): Secondary | ICD-10-CM | POA: Diagnosis not present

## 2017-07-03 LAB — FERRITIN: Ferritin: 200 ng/mL (ref 24–336)

## 2017-07-03 LAB — POCT HEMOGLOBIN-HEMACUE: Hemoglobin: 12.3 g/dL — ABNORMAL LOW (ref 13.0–17.0)

## 2017-07-03 LAB — IRON AND TIBC
Iron: 45 ug/dL (ref 45–182)
Saturation Ratios: 15 % — ABNORMAL LOW (ref 17.9–39.5)
TIBC: 309 ug/dL (ref 250–450)
UIBC: 264 ug/dL

## 2017-07-03 MED ORDER — EPOETIN ALFA 10000 UNIT/ML IJ SOLN: 10000.0000 [IU] | INTRAMUSCULAR | Status: DC

## 2017-07-03 MED ORDER — EPOETIN ALFA 10000 UNIT/ML IJ SOLN
INTRAMUSCULAR | Status: AC
  Filled 2017-07-03: qty 1

## 2017-07-04 ENCOUNTER — Telehealth: Payer: Self-pay

## 2017-07-04 NOTE — Telephone Encounter (Signed)
Called patient with [facility] interpreting.  Patient states he kept appointment at [facility] yesterday and has a return appointment on February 26 but unsure of the time.  CN will determine the time and request Medicaid transportation.  He states was able to fill his pill boxes himself.  [name] RN, Congregational Nurse [phone]

## 2017-07-16 ENCOUNTER — Encounter (HOSPITAL_COMMUNITY)
Admission: RE | Admit: 2017-07-16 | Discharge: 2017-07-16 | Disposition: A | Discharge status: Home / Self Care | Payer: Medicare Other | Source: Ambulatory Visit | Attending: Nephrology | Admitting: Nephrology

## 2017-07-16 ENCOUNTER — Other Ambulatory Visit: Payer: Self-pay | Admitting: Internal Medicine

## 2017-07-16 ENCOUNTER — Encounter: Payer: Medicare Other | Admitting: Internal Medicine

## 2017-07-16 ENCOUNTER — Encounter: Payer: Self-pay | Admitting: Internal Medicine

## 2017-07-16 VITALS — BP 130/67 | HR 85 | Temp 97.7°F | Resp 18

## 2017-07-16 DIAGNOSIS — N184 Chronic kidney disease, stage 4 (severe): Secondary | ICD-10-CM

## 2017-07-16 LAB — POCT HEMOGLOBIN-HEMACUE: Hemoglobin: 11.1 g/dL — ABNORMAL LOW (ref 13.0–17.0)

## 2017-07-16 MED ORDER — EPOETIN ALFA 10000 UNIT/ML IJ SOLN
10000.0000 [IU] | INTRAMUSCULAR | Status: DC
  Administered 2017-07-16: 15:00:00 10000 [IU] via SUBCUTANEOUS

## 2017-07-16 MED ORDER — EPOETIN ALFA 10000 UNIT/ML IJ SOLN
INTRAMUSCULAR | Status: AC
  Filled 2017-07-16: qty 1

## 2017-07-17 ENCOUNTER — Inpatient Hospital Stay (HOSPITAL_COMMUNITY)
Admission: RE | Admit: 2017-07-17 | Discharge: 2017-07-17 | Disposition: A | Discharge status: Home / Self Care | Payer: Medicare Other | Source: Ambulatory Visit | Attending: Nephrology | Admitting: Nephrology

## 2017-07-18 NOTE — Congregational Nurse Program (Signed)
Congregational Nurse Program Note  Date of Encounter: 07/18/2017  Past Medical History: Past Medical History:  Diagnosis Date  . Barrett's esophagus   . CKD (chronic kidney disease) stage 4, GFR 15-29 ml/min (HCC)    Followed by [facility].  Thought to be due to long-standing HTN  . Edema   . Essential hypertension   . GERD (gastroesophageal reflux disease)   . Gout 05/04/2009   Qualifier: Diagnosis of  By: Dyann Kief MD, Matthias Hughs Details:  Home visit with interpreter [facility].  Patient is now filling his own pill boxes.  States he received a medication delivery yesterday from [employer].  We assisted him with Food Stamp renewal forms and wrote his next MD appointments on his calendar  (08/08/2017 @1 :30 pm with Dr. Justin Mend, [facility], 09/17/2017 with Dr. [facility] and 07/31/2017 [place] for injection).  [name] RN, Congregational Nurse [phone] CNP Questionnaire - 07/18/17 2001      Questionnaire   Patient Status  Refugee    Race  Asian    Location Patient Served At  Not Applicable    Insurance  Medicaid;Medicare    Uninsured  Not Applicable    Food  No food insecurities    Housing/Utilities  Yes, have permanent housing    Transportation  No transportation needs    Interpersonal Safety  Yes, feel physically and emotionally safe where you currently live    Medication  No medication insecurities    Medical Provider  Yes    Referrals  Not Applicable    ED Visit Averted  Not Applicable    Life-Saving Intervention Made  Not Applicable

## 2017-07-31 ENCOUNTER — Encounter (HOSPITAL_COMMUNITY)
Admission: RE | Admit: 2017-07-31 | Discharge: 2017-07-31 | Disposition: A | Discharge status: Home / Self Care | Payer: Medicare Other | Source: Ambulatory Visit | Attending: Nephrology | Admitting: Nephrology

## 2017-07-31 VITALS — BP 144/82 | HR 84 | Temp 98.3°F | Resp 18

## 2017-07-31 DIAGNOSIS — D631 Anemia in chronic kidney disease: Secondary | ICD-10-CM | POA: Insufficient documentation

## 2017-07-31 DIAGNOSIS — N183 Chronic kidney disease, stage 3 (moderate): Secondary | ICD-10-CM | POA: Diagnosis present

## 2017-07-31 DIAGNOSIS — N184 Chronic kidney disease, stage 4 (severe): Secondary | ICD-10-CM | POA: Diagnosis not present

## 2017-07-31 LAB — IRON AND TIBC
IRON: 78 ug/dL (ref 45–182)
Saturation Ratios: 25 % (ref 17.9–39.5)
TIBC: 309 ug/dL (ref 250–450)
UIBC: 231 ug/dL

## 2017-07-31 LAB — POCT HEMOGLOBIN-HEMACUE: Hemoglobin: 11.1 g/dL — ABNORMAL LOW (ref 13.0–17.0)

## 2017-07-31 LAB — FERRITIN: Ferritin: 262 ng/mL (ref 24–336)

## 2017-07-31 MED ORDER — EPOETIN ALFA 10000 UNIT/ML IJ SOLN
10000.0000 [IU] | INTRAMUSCULAR | Status: DC
  Administered 2017-07-31: 10000 [IU] via SUBCUTANEOUS

## 2017-07-31 MED ORDER — EPOETIN ALFA 10000 UNIT/ML IJ SOLN
INTRAMUSCULAR | Status: AC
  Filled 2017-07-31: qty 1

## 2017-08-08 DIAGNOSIS — D631 Anemia in chronic kidney disease: Secondary | ICD-10-CM | POA: Diagnosis not present

## 2017-08-08 DIAGNOSIS — Z6837 Body mass index (BMI) 37.0-37.9, adult: Secondary | ICD-10-CM | POA: Diagnosis not present

## 2017-08-08 DIAGNOSIS — N2581 Secondary hyperparathyroidism of renal origin: Secondary | ICD-10-CM | POA: Diagnosis not present

## 2017-08-08 DIAGNOSIS — N183 Chronic kidney disease, stage 3 (moderate): Secondary | ICD-10-CM | POA: Diagnosis not present

## 2017-08-08 DIAGNOSIS — E785 Hyperlipidemia, unspecified: Secondary | ICD-10-CM | POA: Diagnosis not present

## 2017-08-08 NOTE — Congregational Nurse Program (Signed)
Congregational Nurse Program Note  Date of Encounter: 08/08/2017  Past Medical History: Past Medical History:  Diagnosis Date  . Barrett's esophagus   . CKD (chronic kidney disease) stage 4, GFR 15-29 ml/min (HCC)    Followed by [facility].  Thought to be due to long-standing HTN  . Edema   . Essential hypertension   . GERD (gastroesophageal reflux disease)   . Gout 05/04/2009   Qualifier: Diagnosis of  By: Dyann Kief MD, Matthias Hughs Details:  CN and interpreter [name] accompanied patient to follow up visit with Dr. Edrick Oh at [facility].  Lab work ordered and they will call with results.  Patient remains stable.  Next appointment due in 4 months.  [name] RN,  Congregational Nurse [phone] CNP Questionnaire - 08/08/17 2110      Questionnaire   Patient Status  Refugee    Race  Asian    Location Patient Served At  Not Applicable    Insurance  Medicaid;Medicare    Uninsured  Not Applicable    Food  No food insecurities    Housing/Utilities  Yes, have permanent housing    Transportation  No transportation needs    Interpersonal Safety  Yes, feel physically and emotionally safe where you currently live    Medication  No medication insecurities    Medical Provider  Yes    Referrals  Not Applicable    ED Visit Averted  Not Applicable    Life-Saving Intervention Made  Not Applicable

## 2017-08-10 ENCOUNTER — Telehealth: Payer: Self-pay

## 2017-08-10 NOTE — Telephone Encounter (Signed)
Phone call from [facility] at Dr. Jason Nest office.  He stated that Detron's lab work is stable and there will be no medication changes at this time. CN and interpreter Diu Bernadene Bell will relay information to patient.  [name] RN, Congregational Nurse [phone]

## 2017-08-13 NOTE — Congregational Nurse Program (Signed)
Congregational Nurse Program Note  Date of Encounter: 08/13/2017  Past Medical History: Past Medical History:  Diagnosis Date  . Barrett's esophagus   . CKD (chronic kidney disease) stage 4, GFR 15-29 ml/min (HCC)    Followed by [facility].  Thought to be due to long-standing HTN  . Edema   . Essential hypertension   . GERD (gastroesophageal reflux disease)   . Gout 05/04/2009   Qualifier: Diagnosis of  By: Dyann Kief MD, Matthias Hughs Details:  Home visit to deliver [place] recommended by Dr. Justin Mend for itching on left arm and leg.  [name] RN, Congregational Nurse [phone] CNP Questionnaire - 08/13/17 2024      Questionnaire   Patient Status  Refugee    Race  Asian    Location Patient Served At  Not Applicable    Insurance  Medicaid;Medicare    Uninsured  Not Applicable    Food  No food insecurities    Housing/Utilities  Yes, have permanent housing    Transportation  No transportation needs    Interpersonal Safety  Yes, feel physically and emotionally safe where you currently live    Medication  No medication insecurities    Medical Provider  Yes    Referrals  Not Applicable    ED Visit Averted  Not Applicable    Life-Saving Intervention Made  Not Applicable

## 2017-08-14 ENCOUNTER — Encounter (HOSPITAL_COMMUNITY): Payer: Medicare Other

## 2017-08-14 ENCOUNTER — Encounter (HOSPITAL_COMMUNITY)
Admission: RE | Admit: 2017-08-14 | Discharge: 2017-08-14 | Disposition: A | Discharge status: Home / Self Care | Payer: Medicare Other | Source: Ambulatory Visit | Attending: Nephrology | Admitting: Nephrology

## 2017-08-14 VITALS — BP 127/81 | HR 97 | Temp 98.2°F | Resp 16

## 2017-08-14 DIAGNOSIS — N184 Chronic kidney disease, stage 4 (severe): Secondary | ICD-10-CM | POA: Diagnosis not present

## 2017-08-14 DIAGNOSIS — N183 Chronic kidney disease, stage 3 (moderate): Secondary | ICD-10-CM | POA: Diagnosis not present

## 2017-08-14 MED ORDER — EPOETIN ALFA 10000 UNIT/ML IJ SOLN
INTRAMUSCULAR | Status: AC
  Filled 2017-08-14: qty 1

## 2017-08-14 MED ORDER — EPOETIN ALFA 10000 UNIT/ML IJ SOLN
10000.0000 [IU] | INTRAMUSCULAR | Status: DC
  Administered 2017-08-14: 10000 [IU] via SUBCUTANEOUS

## 2017-08-15 LAB — POCT HEMOGLOBIN-HEMACUE: HEMOGLOBIN: 10.5 g/dL — AB (ref 13.0–17.0)

## 2017-08-15 NOTE — Congregational Nurse Program (Signed)
HCongregational Nurse Program Note  Date of Encounter: 08/15/2017  Past Medical History: Past Medical History:  Diagnosis Date  . Barrett's esophagus   . CKD (chronic kidney disease) stage 4, GFR 15-29 ml/min (HCC)    Followed by [facility].  Thought to be due to long-standing HTN  . Edema   . Essential hypertension   . GERD (gastroesophageal reflux disease)   . Gout 05/04/2009   Qualifier: Diagnosis of  By: Dyann Kief MD, Matthias Hughs Details:  Home visit with Interpreter [name] to explain new medicine that was ordered by Dr. Justin Mend.  Due to abnormal lab results on his 08/08/2017 visit to [facility] he is now taking Calcitriol 1/day.  Added medicine to his pill boxes.  [name] RN, Congregational Nurse [phone] CNP Questionnaire - 08/15/17 2041      Questionnaire   Patient Status  Refugee    Race  Asian    Location Patient Served At  Not Applicable    Insurance  Medicaid;Medicare    Uninsured  Not Applicable    Food  No food insecurities    Housing/Utilities  Yes, have permanent housing    Transportation  No transportation needs    Interpersonal Safety  Yes, feel physically and emotionally safe where you currently live    Medication  No medication insecurities    Medical Provider  Yes    Referrals  Not Applicable    ED Visit Averted  Not Applicable    Life-Saving Intervention Made  Not Applicable

## 2017-08-28 ENCOUNTER — Encounter (HOSPITAL_COMMUNITY): Payer: Medicare Other

## 2017-08-29 ENCOUNTER — Encounter (HOSPITAL_COMMUNITY)
Admission: RE | Admit: 2017-08-29 | Discharge: 2017-08-29 | Disposition: A | Discharge status: Home / Self Care | Payer: Medicare Other | Source: Ambulatory Visit | Attending: Nephrology | Admitting: Nephrology

## 2017-08-29 VITALS — BP 142/82 | HR 80 | Temp 98.2°F | Resp 16

## 2017-08-29 DIAGNOSIS — D631 Anemia in chronic kidney disease: Secondary | ICD-10-CM | POA: Insufficient documentation

## 2017-08-29 DIAGNOSIS — N183 Chronic kidney disease, stage 3 (moderate): Secondary | ICD-10-CM | POA: Insufficient documentation

## 2017-08-29 DIAGNOSIS — N184 Chronic kidney disease, stage 4 (severe): Secondary | ICD-10-CM

## 2017-08-29 LAB — FERRITIN: Ferritin: 152 ng/mL (ref 24–336)

## 2017-08-29 LAB — IRON AND TIBC
IRON: 104 ug/dL (ref 45–182)
Saturation Ratios: 37 % (ref 17.9–39.5)
TIBC: 283 ug/dL (ref 250–450)
UIBC: 179 ug/dL

## 2017-08-29 MED ORDER — EPOETIN ALFA 10000 UNIT/ML IJ SOLN
10000.0000 [IU] | INTRAMUSCULAR | Status: DC
  Administered 2017-08-29: 14:00:00 10000 [IU] via SUBCUTANEOUS

## 2017-08-29 MED ORDER — EPOETIN ALFA 10000 UNIT/ML IJ SOLN
INTRAMUSCULAR | Status: AC
  Filled 2017-08-29: qty 1

## 2017-08-31 LAB — POCT HEMOGLOBIN-HEMACUE: Hemoglobin: 10.2 g/dL — ABNORMAL LOW (ref 13.0–17.0)

## 2017-09-11 ENCOUNTER — Encounter (HOSPITAL_COMMUNITY): Payer: Medicare Other

## 2017-09-12 ENCOUNTER — Inpatient Hospital Stay (HOSPITAL_COMMUNITY): Admission: RE | Admit: 2017-09-12 | Payer: Medicare Other | Source: Ambulatory Visit

## 2017-09-17 ENCOUNTER — Ambulatory Visit (INDEPENDENT_AMBULATORY_CARE_PROVIDER_SITE_OTHER): Payer: Medicare Other | Admitting: Internal Medicine

## 2017-09-17 ENCOUNTER — Encounter: Payer: Self-pay | Admitting: Internal Medicine

## 2017-09-17 ENCOUNTER — Other Ambulatory Visit: Payer: Self-pay

## 2017-09-17 VITALS — BP 144/75 | HR 81 | Temp 97.8°F | Ht 63.0 in | Wt 189.8 lb

## 2017-09-17 DIAGNOSIS — Z87891 Personal history of nicotine dependence: Secondary | ICD-10-CM | POA: Diagnosis not present

## 2017-09-17 DIAGNOSIS — Z79899 Other long term (current) drug therapy: Secondary | ICD-10-CM

## 2017-09-17 DIAGNOSIS — M1A39X0 Chronic gout due to renal impairment, multiple sites, without tophus (tophi): Secondary | ICD-10-CM

## 2017-09-17 DIAGNOSIS — Z79891 Long term (current) use of opiate analgesic: Secondary | ICD-10-CM | POA: Diagnosis not present

## 2017-09-17 DIAGNOSIS — M170 Bilateral primary osteoarthritis of knee: Secondary | ICD-10-CM

## 2017-09-17 DIAGNOSIS — R7309 Other abnormal glucose: Secondary | ICD-10-CM | POA: Diagnosis not present

## 2017-09-17 DIAGNOSIS — I10 Essential (primary) hypertension: Secondary | ICD-10-CM | POA: Diagnosis not present

## 2017-09-17 MED ORDER — TRAMADOL HCL 50 MG PO TABS: 50.0000 mg | ORAL_TABLET | Freq: Two times a day (BID) | ORAL | 0 refills | Status: AC | PRN

## 2017-09-17 MED ORDER — ALLOPURINOL 100 MG PO TABS: 50.0000 mg | ORAL_TABLET | Freq: Every day | ORAL | 3 refills | Status: AC

## 2017-09-17 NOTE — Patient Instructions (Signed)
Luke Dunn you are doing well.  I will give you some more tramadol for your arthritis pain, a two week supply, if you are still having pain please return and we can give you a knee injection as we discussed.  We are changing your gout medication today.  Keep taking your blood pressure medication as prescribed

## 2017-09-17 NOTE — Congregational Nurse Program (Signed)
Congregational Nurse Program Note  Date of Encounter: 09/17/2017  Past Medical History: Past Medical History:  Diagnosis Date  . Barrett's esophagus   . CKD (chronic kidney disease) stage 4, GFR 15-29 ml/min (HCC)    Followed by [facility].  Thought to be due to long-standing HTN  . Edema   . Essential hypertension   . GERD (gastroesophageal reflux disease)   . Gout 05/04/2009   Qualifier: Diagnosis of  By: Dyann Kief MD, Matthias Hughs Details:  Accompanied patient to MD appointment at [facility].  His Uloric was changed to Allopurinol 50 mg per day.  CN will home visit patient with interpreter [name] on 09/19/2017 to help him switch medicines in his pill box.  [name] RN, Congregational Nurse [phone] CNP Questionnaire - 09/17/17 1812      Questionnaire   Patient Status  Refugee    Race  Asian    Location Patient Served At  Not Applicable    Insurance  Medicaid;Medicare    Uninsured  Not Applicable    Food  No food insecurities    Housing/Utilities  Yes, have permanent housing    Transportation  No transportation needs    Interpersonal Safety  Yes, feel physically and emotionally safe where you currently live    Medication  No medication insecurities    Medical Provider  Yes    Referrals  Not Applicable    ED Visit Averted  Not Applicable    Life-Saving Intervention Made  Not Applicable      Clinical Intake - 09/17/17 1418      Pre-visit preparation   Pre-visit preparation completed  Yes      Pain   Pain   No/denies pain    Pain Score  0-No pain      Nutrition Screen   BMI - recorded  33.62    Nutritional Status  BMI > 30  Obese    Nutritional Risks  None    Diabetes  No      Functional Status   Activities of Daily Living  Independent    Ambulation  Independent    Medication Administration  Independent    Home Management  Independent      Risk/Barriers   Barriers to Care Management & Learning  None      Abuse/Neglect    Do you feel unsafe in your current relationship?  No    Do you feel physically threatened by others?  No    Anyone hurting you at home, work, or school?  No    Unable to ask?  No    Information provided on Community resources  No      Patient Literacy   How often do you need to have someone help you when you read instructions, pamphlets, or other written materials from your doctor or pharmacy?  5 - Always    What is the last grade level you completed in school?  no school      Programmer, multimedia   Interpreter Needed?  Yes    [place] Name  [place]    Patient Declined Interpreter   No    Patient signed [facility] waiver  No      Comments   Information entered by :  [place], [place] 09/17/2017 2:22PM

## 2017-09-17 NOTE — Progress Notes (Signed)
CC: left knee pain, Gout, follow up on HTN, gout,   HPI:  [name] is a 67 y.o. 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  He took a tramadol that he had received from our clinic in the past with good pain relief.    Please see A&P for status of the patient's chronic medical conditions  Past Medical History:  Diagnosis Date  . Barrett's esophagus   . CKD (chronic kidney disease) stage 4, GFR 15-29 ml/min (HCC)    Followed by [facility].  Thought to be due to long-standing HTN  . Edema   . Essential hypertension   . GERD (gastroesophageal reflux disease)   . Gout 05/04/2009   Qualifier: Diagnosis of  By: Dyann Kief MD, Clifton James     Review of Systems:  ROS: Pulmonary: pt denies increased work of breathing, shortness of breath,  Cardiac: pt denies palpitations, chest pain,  Abdominal: pt denies abdominal pain, nausea, vomiting, or diarrhea  Physical Exam:  Vitals:   09/17/17 1416  BP: (!) 144/75  Pulse: 81  Temp: 97.8 F (36.6 C)  TempSrc: Oral  SpO2: 100%  Weight: 189 lb 12.8 oz (86.1 kg)  Height: 5\' 3"  (1.6 m)   Physical Exam  Constitutional: He appears well-developed and well-nourished.  Eyes: Right eye exhibits no discharge. Left eye exhibits no discharge. No scleral icterus.  Cardiovascular: Normal rate, regular rhythm, normal heart sounds and intact distal pulses. Exam reveals no gallop and no friction rub.  No murmur heard. No signs consistent with fluid overload, no peripheral edema, JVD  Pulmonary/Chest: Effort normal and breath sounds normal. No respiratory distress. He has no wheezes. He has no rales.  Abdominal: Soft. Bowel sounds are normal. He exhibits no distension and no mass. There is no tenderness. There is no guarding.  Musculoskeletal:  Knee not hot or erythematous, crepitus appreciated on exam, pain with extension of  the knee  Neurological: He is alert.    Social History   Socioeconomic History  . Marital status: Single    Spouse name: Not on file  . Number of children: Not on file  . Years of education: Not on file  . Highest education level: Not on file  Occupational History  . Not on file  Social Needs  . Financial resource strain: Not on file  . Food insecurity:    Worry: Not on file    Inability: Not on file  . Transportation needs:    Medical: Not on file    Non-medical: Not on file  Tobacco Use  . Smoking status: Former Smoker    Packs/day: 2.00    Years: 37.00    Pack years: 74.00    Last attempt to quit: 02/01/2007    Years since quitting: 10.6  . Smokeless tobacco: Former Systems developer    Quit date: 02/01/2007  Substance and Sexual Activity  . Alcohol use: No    Alcohol/week: 0.0 oz  . Drug use: No  . Sexual activity: Not on file  Lifestyle  . Physical activity:    Days per week: Not on file    Minutes per session: Not on file  . Stress: Not on file  Relationships  . Social connections:    Talks on phone: Not on file    Gets together: Not on file    Attends religious service:  Not on file    Active member of club or organization: Not on file    Attends meetings of clubs or organizations: Not on file    Relationship status: Not on file  . Intimate partner violence:    Fear of current or ex partner: Not on file    Emotionally abused: Not on file    Physically abused: Not on file    Forced sexual activity: Not on file  Other Topics Concern  . Not on file  Social History Narrative   [name] speaking only, low educational level, works in Architect.  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       Financial assistance approved for 70% discount at [facility] and has [facility] card.     No family history on file.  Assessment & Plan:   See Encounters Tab for problem based charting.  Patient discussed with Dr. Eppie Gibson

## 2017-09-18 ENCOUNTER — Telehealth: Payer: Self-pay | Admitting: *Deleted

## 2017-09-18 LAB — CMP14 + ANION GAP
ALT: 6 IU/L (ref 0–44)
ANION GAP: 17 mmol/L (ref 10.0–18.0)
AST: 14 IU/L (ref 0–40)
Albumin/Globulin Ratio: 1 — ABNORMAL LOW (ref 1.2–2.2)
Albumin: 3.9 g/dL (ref 3.6–4.8)
Alkaline Phosphatase: 92 IU/L (ref 39–117)
BUN/Creatinine Ratio: 13 (ref 10–24)
BUN: 57 mg/dL — AB (ref 8–27)
Bilirubin Total: 0.2 mg/dL (ref 0.0–1.2)
CALCIUM: 8.5 mg/dL — AB (ref 8.6–10.2)
CO2: 18 mmol/L — AB (ref 20–29)
CREATININE: 4.26 mg/dL — AB (ref 0.76–1.27)
Chloride: 107 mmol/L — ABNORMAL HIGH (ref 96–106)
GFR calc Af Amer: 15 mL/min/{1.73_m2} — ABNORMAL LOW (ref >59)
GFR, EST NON AFRICAN AMERICAN: 13 mL/min/{1.73_m2} — AB (ref >59)
GLUCOSE: 76 mg/dL (ref 65–99)
Globulin, Total: 3.9 g/dL (ref 1.5–4.5)
Potassium: 4.9 mmol/L (ref 3.5–5.2)
Sodium: 142 mmol/L (ref 134–144)
Total Protein: 7.8 g/dL (ref 6.0–8.5)

## 2017-09-18 LAB — HEMOGLOBIN A1C
Est. average glucose Bld gHb Est-mCnc: 111 mg/dL
Hgb A1c MFr Bld: 5.5 % (ref 4.8–5.6)

## 2017-09-18 NOTE — Telephone Encounter (Addendum)
Information was faxed to CoverMyMeds for PA consideration for Tramadol.  Awaiting decisioin.  Sander Nephew, RN 09/18/2017 12:12 PM  Tramadol was approved for patient effective 05/20/2017 thru 05/21/2018.  [facility] was called and informed of.  Sander Nephew, RN 5/1/

## 2017-09-19 ENCOUNTER — Encounter (HOSPITAL_COMMUNITY): Payer: Self-pay

## 2017-09-19 ENCOUNTER — Ambulatory Visit (HOSPITAL_COMMUNITY)
Admission: RE | Admit: 2017-09-19 | Discharge: 2017-09-19 | Disposition: A | Discharge status: Home / Self Care | Payer: Medicare Other | Source: Ambulatory Visit | Attending: Nephrology | Admitting: Nephrology

## 2017-09-19 NOTE — Assessment & Plan Note (Addendum)
Lab Results  Component Value Date   HGBA1C 5.5 09/17/2017   Pt with multiple glucose readings above the normal range on labs.  History of this in the past as well.  No symptoms consistent with diabetes.  -will check screening A1C  Addendum: A1C returned normal

## 2017-09-19 NOTE — Congregational Nurse Program (Signed)
Congregational Nurse Program Note  Date of Encounter: 09/19/2017  Past Medical History: Past Medical History:  Diagnosis Date  . Barrett's esophagus   . CKD (chronic kidney disease) stage 4, GFR 15-29 ml/min (HCC)    Followed by [facility].  Thought to be due to long-standing HTN  . Edema   . Essential hypertension   . GERD (gastroesophageal reflux disease)   . Gout 05/04/2009   Qualifier: Diagnosis of  By: Dyann Kief MD, Matthias Hughs Details:  Home visit with interpreter [facility].  CN delivered medication refills for Calcitrol and Sodium Bicarb and new prescription Allopurinol.  Removed Uloric from pill boxes and replaced it with Allopurinol.  We educated patient on new medicine and dosage.  We also reminded him of 1:30 appointment at [facility] to get his Procrit injection.  [name] RN, Congregational Nurse [phone] CNP Questionnaire - 09/19/17 1837      Questionnaire   Patient Status  Refugee    Race  Asian    Location Patient Served At  Not Applicable    Insurance  Medicaid;Medicare    Uninsured  Not Applicable    Food  No food insecurities    Housing/Utilities  Yes, have permanent housing    Transportation  No transportation needs    Interpersonal Safety  Yes, feel physically and emotionally safe where you currently live    Medication  No medication insecurities    Medical Provider  Yes    Referrals  Not Applicable    ED Visit Averted  Not Applicable    Life-Saving Intervention Made  Not Applicable      Clinical Intake - 09/17/17 1418      Pre-visit preparation   Pre-visit preparation completed  Yes      Pain   Pain   No/denies pain    Pain Score  0-No pain      Nutrition Screen   BMI - recorded  33.62    Nutritional Status  BMI > 30  Obese    Nutritional Risks  None    Diabetes  No      Functional Status   Activities of Daily Living  Independent    Ambulation  Independent    Medication Administration  Independent    Home Management  Independent      Risk/Barriers   Barriers to Care Management & Learning  None      Abuse/Neglect   Do you feel unsafe in your current relationship?  No    Do you feel physically threatened by others?  No    Anyone hurting you at home, work, or school?  No    Unable to ask?  No    Information provided on Community resources  No      Patient Literacy   How often do you need to have someone help you when you read instructions, pamphlets, or other written materials from your doctor or pharmacy?  5 - Always    What is the last grade level you completed in school?  no school      Programmer, multimedia   Interpreter Needed?  Yes    [place] Name  [place]    Patient Declined Interpreter   No    Patient signed [facility] waiver  No      Comments   Information entered by :  [place], [place] 09/17/2017 2:22PM

## 2017-09-19 NOTE — Assessment & Plan Note (Signed)
BP Readings from Last 3 Encounters:  09/17/17 (!) 144/75  08/29/17 (!) 142/82  08/14/17 127/81   Pts nurse reports blood pressure usually is well controlled.  On reviewing his encounters for EPO injections every two weeks his bp average is about low 123'N systolic.    -will continue amlodipine 10mg  and lasix 40mg  BID

## 2017-09-19 NOTE — Assessment & Plan Note (Signed)
Pt currently on uloric for gout, denies history of tophi.  Received black box warning message about uloric and increased cardiac deaths and risk for MI.  Discussed with attending, nurse and patient about switching therapy to allopurinol.  He has been on this in the past, it is unclear why the switch to uloric was made based on chart review as this happened years ago.    -will begin with allopurinol 50mg  daily as pt with renal disease -will check a uric acid level at next visit -instructed home health nurse, patient and interpreter to watch for signs of allopurinol toxicity specifically rash, pruritis, other skin changes such as erythema -will get baseline LFT's for pt and monitor for toxicity this way as well

## 2017-09-19 NOTE — Assessment & Plan Note (Signed)
Pt has not complained of knee pain in the last several visits.  It sounds as if the yard work he was doing for [employer] exacerbated.  The knee does not appear warm or erythematous and the patient has no infectious symptoms.  I do not appreciate a large effusion and it does not appear swollen with comparison to the other knee.    -will give short course of tramadol 50mg  BID PRN -instructed pt to come back to clinic for an injection in two weeks if he is not back to baseline

## 2017-09-22 NOTE — Progress Notes (Signed)
Case discussed with Dr. Shan Levans at the time of the visit.  We reviewed the resident's history and exam and pertinent patient test results.  I agree with the assessment, diagnosis and plan of care documented in the resident's note.

## 2017-09-26 ENCOUNTER — Encounter (HOSPITAL_COMMUNITY): Payer: Medicare Other

## 2017-10-03 ENCOUNTER — Encounter (HOSPITAL_COMMUNITY): Payer: Medicare Other

## 2017-10-03 ENCOUNTER — Telehealth: Payer: Self-pay

## 2017-10-03 NOTE — Telephone Encounter (Signed)
Phone call to Dr. Jason Nest office [facility]  Message left with Ebony the on-call nurse for Dr. Justin Mend stating that patient has chosen to stop his Procrit injections.  [name] RN, Congregational Nurse [phone]

## 2017-10-03 NOTE — Congregational Nurse Program (Signed)
Congregational Nurse Program Note  Date of Encounter: 10/03/2017  Past Medical History: Past Medical History:  Diagnosis Date  . Barrett's esophagus   . CKD (chronic kidney disease) stage 4, GFR 15-29 ml/min (HCC)    Followed by [facility].  Thought to be due to long-standing HTN  . Edema   . Essential hypertension   . GERD (gastroesophageal reflux disease)   . Gout 05/04/2009   Qualifier: Diagnosis of  By: Dyann Kief MD, Matthias Hughs Details:  Home visit with interpreter [facility].  Patient states he does not want to continue Procrit shots.  We explained purpose of injections and warned him that this could cause him to need dialysis sooner. We asked him to think about this and told him we will contact Dr. Justin Mend regarding this decision.  We also helped him fill his pill boxes.  He had omitted the calcitriol when he did it himself.  Will follow-up with patient in 1 week. [name] RN, Congregational Nurse [phone] CNP Questionnaire - 10/03/17 1925      Questionnaire   Patient Status  Refugee    Race  Asian    Location Patient Served At  Not Applicable    Insurance  Medicaid;Medicare    Uninsured  Not Applicable    Food  No food insecurities    Housing/Utilities  Yes, have permanent housing    Transportation  No transportation needs    Interpersonal Safety  Yes, feel physically and emotionally safe where you currently live    Medication  No medication insecurities    Medical Provider  Yes    Referrals  Not Applicable    ED Visit Averted  Not Applicable    Life-Saving Intervention Made  Not Applicable      Clinical Intake - 09/17/17 1418      Pre-visit preparation   Pre-visit preparation completed  Yes      Pain   Pain   No/denies pain    Pain Score  0-No pain      Nutrition Screen   BMI - recorded  33.62    Nutritional Status  BMI > 30  Obese    Nutritional Risks  None    Diabetes  No      Functional Status   Activities of Daily Living   Independent    Ambulation  Independent    Medication Administration  Independent    Home Management  Independent      Risk/Barriers   Barriers to Care Management & Learning  None      Abuse/Neglect   Do you feel unsafe in your current relationship?  No    Do you feel physically threatened by others?  No    Anyone hurting you at home, work, or school?  No    Unable to ask?  No    Information provided on Community resources  No      Patient Literacy   How often do you need to have someone help you when you read instructions, pamphlets, or other written materials from your doctor or pharmacy?  5 - Always    What is the last grade level you completed in school?  no school      Programmer, multimedia   Interpreter Needed?  Yes    [place]    Interpreter Name  [facility]    Patient Declined Interpreter   No    Patient signed [facility] waiver  No  Comments   Information entered by :  [employer] [facility], [place] 09/17/2017 2:22PM

## 2017-10-17 ENCOUNTER — Encounter (HOSPITAL_COMMUNITY): Payer: Medicare Other

## 2017-10-17 NOTE — Congregational Nurse Program (Signed)
Congregational Nurse Program Note  Date of Encounter: 10/17/2017  Past Medical History: Past Medical History:  Diagnosis Date  . Barrett's esophagus   . CKD (chronic kidney disease) stage 4, GFR 15-29 ml/min (HCC)    Followed by [facility].  Thought to be due to long-standing HTN  . Edema   . Essential hypertension   . GERD (gastroesophageal reflux disease)   . Gout 05/04/2009   Qualifier: Diagnosis of  By: Dyann Kief MD, Matthias Hughs Details:  Home visit with interpreter [facility].  Patient states he is feeling good .  No pain in feet or legs.  BP 124/72.  Called in refill request for Calcitriol to [facility].  They will deliver it tomorrow morning.  Luke Michaelis RN, Congregational Nurse [phone] CNP Questionnaire - 10/17/17 1738      Questionnaire   Patient Status  Refugee    Race  Asian    Location Patient Served At  Not Applicable    Insurance  Medicaid;Medicare    Uninsured  Not Applicable    Food  No food insecurities    Housing/Utilities  Yes, have permanent housing    Transportation  No transportation needs    Interpersonal Safety  Yes, feel physically and emotionally safe where you currently live    Medication  No medication insecurities    Medical Provider  Yes    Referrals  Not Applicable    ED Visit Averted  Not Applicable    Life-Saving Intervention Made  Not Applicable

## 2017-11-07 NOTE — Congregational Nurse Program (Signed)
Congregational Nurse Program Note  Date of Encounter: 11/07/2017  Past Medical History: Past Medical History:  Diagnosis Date  . Barrett's esophagus   . CKD (chronic kidney disease) stage 4, GFR 15-29 ml/min (HCC)    Followed by [facility].  Thought to be due to long-standing HTN  . Edema   . Essential hypertension   . GERD (gastroesophageal reflux disease)   . Gout 05/04/2009   Qualifier: Diagnosis of  By: Dyann Kief MD, Matthias Hughs Details:  Home visit with interpreter [facility].  Patient states he is feeling well.  Pill boxes filled for 2 weeks.  BP 150/78.  States he has eaten a lot of fish sauce in the past 2 days.  Will be more careful about sodium intake.  CN will home visit in 2 weeks and recheck BP.  [name] RN, Congregational Nurse [phone] CNP Questionnaire - 11/07/17 1923      Questionnaire   Patient Status  Refugee    Race  Asian    Location Patient Served At  Not Applicable    Insurance  Medicaid;Medicare    Uninsured  Not Applicable    Food  No food insecurities    Housing/Utilities  Yes, have permanent housing    Transportation  No transportation needs    Interpersonal Safety  Yes, feel physically and emotionally safe where you currently live    Medication  No medication insecurities    Medical Provider  Yes    Referrals  Not Applicable    ED Visit Averted  Not Applicable    Life-Saving Intervention Made  Not Applicable

## 2017-11-14 NOTE — Congregational Nurse Program (Signed)
Congregational Nurse Program Note  Date of Encounter: 11/14/2017  Past Medical History: Past Medical History:  Diagnosis Date  . Barrett's esophagus   . CKD (chronic kidney disease) stage 4, GFR 15-29 ml/min (HCC)    Followed by [facility].  Thought to be due to long-standing HTN  . Edema   . Essential hypertension   . GERD (gastroesophageal reflux disease)   . Gout 05/04/2009   Qualifier: Diagnosis of  By: Dyann Kief MD, Matthias Hughs Details:  Home visit with interpreter [facility].  Patient states he is doing well and taking medicine as ordered.  He had received appointment in mail for [facility].  We advised him it is on 12/12/2017 at 2:00 pm.  [name] RN, Congregational Nurse [phone] CNP Questionnaire - 11/14/17 1609      Questionnaire   Patient Status  Refugee    Race  Asian    Location Patient Served At  Not Applicable    Insurance  Medicaid;Medicare    Uninsured  Not Applicable    Food  No food insecurities    Housing/Utilities  Yes, have permanent housing    Transportation  No transportation needs    Interpersonal Safety  Yes, feel physically and emotionally safe where you currently live    Medication  No medication insecurities    Medical Provider  Yes    Referrals  Not Applicable    ED Visit Averted  Not Applicable    Life-Saving Intervention Made  Not Applicable

## 2017-11-28 ENCOUNTER — Other Ambulatory Visit: Payer: Self-pay | Admitting: Internal Medicine

## 2017-11-28 NOTE — Telephone Encounter (Signed)
Will refill one month may need to adjust dose at next visit

## 2017-11-28 NOTE — Telephone Encounter (Signed)
Next appt scheduled  7/22 with PCP.

## 2017-11-28 NOTE — Congregational Nurse Program (Signed)
Congregational Nurse Program Note  Date of Encounter: 11/28/2017  Past Medical History: Past Medical History:  Diagnosis Date  . Barrett's esophagus   . CKD (chronic kidney disease) stage 4, GFR 15-29 ml/min (HCC)    Followed by [facility].  Thought to be due to long-standing HTN  . Edema   . Essential hypertension   . GERD (gastroesophageal reflux disease)   . Gout 05/04/2009   Qualifier: Diagnosis of  By: Dyann Kief MD, Matthias Hughs Details:  Home visit with interpreter [facility].  Patient states he is doing well.  No complaints.  BP 132/80.  Filled pill boxes for 2 weeks and called in refill requests to [facility]. They will deliver Calcitrol, Sodium Bicarb. Furosemide and Amlodipine tomorrow between 10:00 am and 12 noon. Reminded patient of upcoming appointments on 12/10/2017 at [facility] and 12/12/2017 at [facility] with Dr. Justin Mend.  [name] RN, Congregational Nurse [phone] CNP Questionnaire - 11/28/17 1906      Questionnaire   Patient Status  Refugee    Race  Asian    Location Patient Served At  Not Applicable    Insurance  Medicaid;Medicare    Uninsured  Not Applicable    Food  No food insecurities    Housing/Utilities  Yes, have permanent housing    Transportation  No transportation needs    Interpersonal Safety  Yes, feel physically and emotionally safe where you currently live    Medication  No medication insecurities    Medical Provider  Yes    Referrals  Not Applicable    ED Visit Averted  Not Applicable    Life-Saving Intervention Made  Not Applicable

## 2017-12-05 NOTE — Congregational Nurse Program (Signed)
Congregational Nurse Program Note  Date of Encounter: 12/05/2017  Past Medical History: Past Medical History:  Diagnosis Date  . Barrett's esophagus   . CKD (chronic kidney disease) stage 4, GFR 15-29 ml/min (HCC)    Followed by [facility].  Thought to be due to long-standing HTN  . Edema   . Essential hypertension   . GERD (gastroesophageal reflux disease)   . Gout 05/04/2009   Qualifier: Diagnosis of  By: Dyann Kief MD, Matthias Hughs Details:  Home visit to remind patient of 12/10/2017 appointment with Dr. Shan Levans at [facility].  He had received a letter from [facility] asking him to designate PCP and to choose a [place].  He will choose a plan once he determines which one is in network with [facility].  [name] RN, Congregational Nurse [phone] CNP Questionnaire - 12/05/17 2112      Questionnaire   Patient Status  Refugee    Race  Asian    Location Patient Served At  Not Applicable    Insurance  Medicaid;Medicare    Uninsured  Not Applicable    Food  No food insecurities    Housing/Utilities  Yes, have permanent housing    Transportation  No transportation needs    Interpersonal Safety  Yes, feel physically and emotionally safe where you currently live    Medication  No medication insecurities    Medical Provider  Yes    Referrals  Not Applicable    ED Visit Averted  Not Applicable    Life-Saving Intervention Made  Not Applicable

## 2017-12-10 ENCOUNTER — Ambulatory Visit (INDEPENDENT_AMBULATORY_CARE_PROVIDER_SITE_OTHER): Payer: Medicare Other | Admitting: Internal Medicine

## 2017-12-10 ENCOUNTER — Other Ambulatory Visit: Payer: Self-pay

## 2017-12-10 ENCOUNTER — Encounter: Payer: Self-pay | Admitting: Internal Medicine

## 2017-12-10 VITALS — BP 139/68 | HR 96 | Temp 98.8°F | Ht 63.0 in | Wt 197.3 lb

## 2017-12-10 DIAGNOSIS — Z9114 Patient's other noncompliance with medication regimen: Secondary | ICD-10-CM

## 2017-12-10 DIAGNOSIS — N185 Chronic kidney disease, stage 5: Secondary | ICD-10-CM

## 2017-12-10 DIAGNOSIS — I120 Hypertensive chronic kidney disease with stage 5 chronic kidney disease or end stage renal disease: Secondary | ICD-10-CM | POA: Diagnosis not present

## 2017-12-10 DIAGNOSIS — Z87891 Personal history of nicotine dependence: Secondary | ICD-10-CM | POA: Diagnosis not present

## 2017-12-10 DIAGNOSIS — I10 Essential (primary) hypertension: Secondary | ICD-10-CM

## 2017-12-10 DIAGNOSIS — Z79899 Other long term (current) drug therapy: Secondary | ICD-10-CM

## 2017-12-10 NOTE — Congregational Nurse Program (Signed)
Congregational Nurse Program Note  Date of Encounter: 12/10/2017  Past Medical History: Past Medical History:  Diagnosis Date  . Barrett's esophagus   . CKD (chronic kidney disease) stage 4, GFR 15-29 ml/min (HCC)    Followed by [facility].  Thought to be due to long-standing HTN  . Edema   . Essential hypertension   . GERD (gastroesophageal reflux disease)   . Gout 05/04/2009   Qualifier: Diagnosis of  By: Dyann Kief MD, Matthias Hughs Details: Home visit.  Filled pill boxes for 2 weeks.  Will request refill for Furosemide from [facility].  Patient was seen by PCP Dr. Shan Levans at [facility] today.  BP good.  Patient c/o dizziness x 1 week in the morning when getting out of bed.  Dr. Shan Levans encouraged him to resume Procrit shots. He will see kidney doctor on 12/12/2017. Return to PCP in 6 months.  [name] RN, Congregational Nurse [phone]  CNP Questionnaire - 12/10/17 2128      Questionnaire   Patient Status  Refugee    Race  Asian    Location Patient Served At  Not Applicable    Insurance  Medicaid;Medicare    Uninsured  Not Applicable    Food  No food insecurities    Housing/Utilities  Yes, have permanent housing    Transportation  No transportation needs    Interpersonal Safety  Yes, feel physically and emotionally safe where you currently live    Medication  No medication insecurities    Medical Provider  Yes    Referrals  Not Applicable    ED Visit Averted  Not Applicable    Life-Saving Intervention Made  Not Applicable      Clinical Intake - 12/10/17 1401      Pre-visit preparation   Pre-visit preparation completed  Yes      Pain   Pain   No/denies pain      Nutrition Screen   BMI - recorded  34.95    Nutritional Status  BMI > 30  Obese    Nutritional Risks  None    Diabetes  No      Functional Status   Activities of Daily Living  Independent    Ambulation  Independent    Medication Administration  Independent    Home  Management  Independent      Risk/Barriers   Barriers to Care Management & Learning  Language Speaks [place].   Speaks [place].     Abuse/Neglect   Do you feel unsafe in your current relationship?  No    Do you feel physically threatened by others?  No    Anyone hurting you at home, work, or school?  No    Unable to ask?  No    Information provided on Community resources  No      Patient Literacy   How often do you need to have someone help you when you read instructions, pamphlets, or other written materials from your doctor or pharmacy?  5 - Always    What is the last grade level you completed in school?  No school.      Investment banker, operational Needed?  Yes    [place]    Interpreter Name  [place]    Patient Declined Interpreter   No      Comments   Information entered by :  Lanell Persons 12/10/17@ 1402 PM

## 2017-12-10 NOTE — Progress Notes (Signed)
CC: follow up on CKD 5, HTN  HPI:  [name] is a 67 y.o. male with PMH below.  He is here for follow up on his CKD 5 and HTN.     Please see A&P for status of the patient's chronic medical conditions  Past Medical History:  Diagnosis Date  . Barrett's esophagus   . CKD (chronic kidney disease) stage 4, GFR 15-29 ml/min (HCC)    Followed by [facility].  Thought to be due to long-standing HTN  . Edema   . Essential hypertension   . GERD (gastroesophageal reflux disease)   . Gout 05/04/2009   Qualifier: Diagnosis of  By: Dyann Kief MD, Clifton James     Review of Systems:  ROS: Pulmonary: pt denies increased work of breathing, shortness of breath,  Cardiac: pt denies palpitations, chest pain,  Abdominal: pt denies abdominal pain, nausea, vomiting, or diarrhea  Physical Exam:  Vitals:   12/10/17 1352  BP: 139/68  Pulse: 96  Temp: 98.8 F (37.1 C)  TempSrc: Oral  SpO2: 100%  Weight: 197 lb 4.8 oz (89.5 kg)  Height: 5\' 3"  (1.6 m)   Physical Exam  Eyes: Right eye exhibits no discharge. Left eye exhibits no discharge. No scleral icterus.  Cardiovascular: Normal rate, regular rhythm, normal heart sounds and intact distal pulses. Exam reveals no gallop and no friction rub.  No murmur heard. Pulmonary/Chest: Effort normal. No respiratory distress. He has decreased breath sounds (mild) in the left lower field. He has no wheezes. He has no rales.  Abdominal: Soft. Bowel sounds are normal. He exhibits no distension and no mass. There is no tenderness. There is no guarding.  Neurological: He is alert.  Skin: He is not diaphoretic.    Social History   Socioeconomic History  . Marital status: Single    Spouse name: Not on file  . Number of children: Not on file  . Years of education: Not on file  . Highest education level: Not on file  Occupational History  . Not on file  Social Needs  . Financial resource strain: Not on file  . Food insecurity:    Worry: Not on file   Inability: Not on file  . Transportation needs:    Medical: Not on file    Non-medical: Not on file  Tobacco Use  . Smoking status: Former Smoker    Packs/day: 2.00    Years: 37.00    Pack years: 74.00    Last attempt to quit: 02/01/2007    Years since quitting: 10.8  . Smokeless tobacco: Former Systems developer    Quit date: 02/01/2007  Substance and Sexual Activity  . Alcohol use: No    Alcohol/week: 0.0 oz  . Drug use: No  . Sexual activity: Not on file  Lifestyle  . Physical activity:    Days per week: Not on file    Minutes per session: Not on file  . Stress: Not on file  Relationships  . Social connections:    Talks on phone: Not on file    Gets together: Not on file    Attends religious service: Not on file    Active member of club or organization: Not on file    Attends meetings of clubs or organizations: Not on file    Relationship status: Not on file  . Intimate partner violence:    Fear of current or ex partner: Not on file    Emotionally abused: Not on file    Physically  abused: Not on file    Forced sexual activity: Not on file  Other Topics Concern  . Not on file  Social History Narrative   [name] speaking only, low educational level, works in Architect.  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       Financial assistance approved for 70% discount at [facility] and has [facility] card.     No family history on file.  Assessment & Plan:   See Encounters Tab for problem based charting.  Patient discussed with Dr. Daryll Drown

## 2017-12-10 NOTE — Patient Instructions (Signed)
Luke Dunn, your blood pressure looks good today.  Continue to think about starting back on the procrit injections and think about dialysis in the future.  We can talk about this more at your next visit.

## 2017-12-10 NOTE — Assessment & Plan Note (Addendum)
Pt followed by [facility].  Report in epic showing he is now refusing his epo injections. We discussed this in great detail today.  He was not very receptive to starting back.  This concerns me as he will need dialysis in the near future.  We spoke about dialysis and he initially had poor understanding of what that entails.  He worked with the interpreter some more and I believe he has a better idea he did not seem very receptive to this either but agreed to think about it.    -may need adjustment of NaHCO3 will have repeat bmp tomorrow at [facility], myself or nephrology will titrate when results are back if serum bicarb still low  -continue to assess pts understanding of dialysis and what that entails, may need palliative care discussion to assess goals of care and we will go over code status at our next visit as he continues to contemplate dialysis

## 2017-12-11 ENCOUNTER — Encounter: Payer: Self-pay | Admitting: Internal Medicine

## 2017-12-11 NOTE — Assessment & Plan Note (Signed)
BP Readings from Last 3 Encounters:  12/10/17 139/68  11/28/17 132/80  11/07/17 (!) 150/78   BP is within goal today.  Pt taking amlodipine 10mg  and lasix 40mg  BID.    -continue regimen above

## 2017-12-12 ENCOUNTER — Telehealth: Payer: Self-pay

## 2017-12-12 NOTE — Progress Notes (Signed)
Internal Medicine Clinic Attending  Case discussed with Dr. Winfrey  at the time of the visit.  We reviewed the resident's history and exam and pertinent patient test results.  I agree with the assessment, diagnosis, and plan of care documented in the resident's note.  

## 2017-12-12 NOTE — Telephone Encounter (Signed)
Phone call to [facility] to change today's appointment due to unavailability of interpreter. Rescheduled for 12/25/2017 at 2:00 pm.  Ciro Backer, Congregational Nurse [phone]

## 2017-12-19 NOTE — Congregational Nurse Program (Signed)
Congregational Nurse Program Note  Date of Encounter: 12/19/2017  Past Medical History:  Home visit with interpreter [name].  Informed patient of appointment on Tuesday 12/25/2017 at 2:00 pm with Dr. Justin Mend at [facility].  We also helped him complete his Medicaid Health Plan paperwork.  States he is taking medication as ordered.  [name] RN, Congregational Nurse [phone] Past Medical History:  Diagnosis Date  . Barrett's esophagus   . CKD (chronic kidney disease) stage 4, GFR 15-29 ml/min (HCC)    Followed by [facility].  Thought to be due to long-standing HTN  . Edema   . Essential hypertension   . GERD (gastroesophageal reflux disease)   . Gout 05/04/2009   Qualifier: Diagnosis of  By: Dyann Kief MD, Matthias Hughs Details: CNP Questionnaire - 12/19/17 1950      Questionnaire   Patient Status  Refugee    Race  Asian    Location Patient Served At  Not Applicable    Insurance  Medicaid;Medicare    Uninsured  Not Applicable    Food  No food insecurities    Housing/Utilities  Yes, have permanent housing    Transportation  No transportation needs    Interpersonal Safety  Yes, feel physically and emotionally safe where you currently live    Medication  No medication insecurities    Medical Provider  Yes    Referrals  Not Applicable    ED Visit Averted  Not Applicable    Life-Saving Intervention Made  Not Applicable      Clinical Intake - 12/10/17 1401      Pre-visit preparation   Pre-visit preparation completed  Yes      Pain   Pain   No/denies pain      Nutrition Screen   BMI - recorded  34.95    Nutritional Status  BMI > 30  Obese    Nutritional Risks  None    Diabetes  No      Functional Status   Activities of Daily Living  Independent    Ambulation  Independent    Medication Administration  Independent    Home Management  Independent      Risk/Barriers   Barriers to Care Management & Learning  Language Speaks [place].   Speaks  [place].     Abuse/Neglect   Do you feel unsafe in your current relationship?  No    Do you feel physically threatened by others?  No    Anyone hurting you at home, work, or school?  No    Unable to ask?  No    Information provided on Community resources  No      Patient Literacy   How often do you need to have someone help you when you read instructions, pamphlets, or other written materials from your doctor or pharmacy?  5 - Always    What is the last grade level you completed in school?  No school.      Investment banker, operational Needed?  Yes    [place]    Interpreter Name  [place]    Patient Declined Interpreter   No      Comments   Information entered by :  Lanell Persons 12/10/17@ 1402 PM

## 2017-12-25 ENCOUNTER — Other Ambulatory Visit: Payer: Self-pay | Admitting: Internal Medicine

## 2017-12-25 DIAGNOSIS — K2270 Barrett's esophagus without dysplasia: Secondary | ICD-10-CM | POA: Diagnosis not present

## 2017-12-25 DIAGNOSIS — I129 Hypertensive chronic kidney disease with stage 1 through stage 4 chronic kidney disease, or unspecified chronic kidney disease: Secondary | ICD-10-CM | POA: Diagnosis not present

## 2017-12-25 DIAGNOSIS — E785 Hyperlipidemia, unspecified: Secondary | ICD-10-CM | POA: Diagnosis not present

## 2017-12-25 DIAGNOSIS — N2581 Secondary hyperparathyroidism of renal origin: Secondary | ICD-10-CM | POA: Diagnosis not present

## 2017-12-25 DIAGNOSIS — I10 Essential (primary) hypertension: Secondary | ICD-10-CM

## 2017-12-25 DIAGNOSIS — N184 Chronic kidney disease, stage 4 (severe): Secondary | ICD-10-CM | POA: Diagnosis not present

## 2017-12-25 DIAGNOSIS — D631 Anemia in chronic kidney disease: Secondary | ICD-10-CM | POA: Diagnosis not present

## 2017-12-25 DIAGNOSIS — R600 Localized edema: Secondary | ICD-10-CM

## 2017-12-25 DIAGNOSIS — M109 Gout, unspecified: Secondary | ICD-10-CM | POA: Diagnosis not present

## 2017-12-25 NOTE — Telephone Encounter (Signed)
Next appt scheduled 06/24/2018 with PCP.

## 2017-12-25 NOTE — Congregational Nurse Program (Signed)
Congregational Nurse Program Note  Date of Encounter: 12/25/2017  Past Medical History: Past Medical History:  Diagnosis Date  . Barrett's esophagus   . CKD (chronic kidney disease) stage 4, GFR 15-29 ml/min (HCC)    Followed by [facility].  Thought to be due to long-standing HTN  . Edema   . Essential hypertension   . GERD (gastroesophageal reflux disease)   . Gout 05/04/2009   Qualifier: Diagnosis of  By: Dyann Kief MD, Matthias Hughs Details:  CN and interpreter [name] accompanied patient to appointment with Dr. Justin Mend at [facility].  Labwork done and results will determine treatment plan.  [name] RN, Congregational Nurse [phone] CNP Questionnaire - 12/25/17 2006      Questionnaire   Patient Status  Refugee    Race  Asian    Location Patient Served At  Not Applicable    Insurance  Medicaid;Medicare    Uninsured  Not Applicable    Food  No food insecurities    Housing/Utilities  Yes, have permanent housing    Transportation  No transportation needs    Interpersonal Safety  Yes, feel physically and emotionally safe where you currently live    Medication  No medication insecurities    Medical Provider  Yes    Referrals  Not Applicable    ED Visit Averted  Not Applicable    Life-Saving Intervention Made  Not Applicable      Clinical Intake - 12/10/17 1401      Pre-visit preparation   Pre-visit preparation completed  Yes      Pain   Pain   No/denies pain      Nutrition Screen   BMI - recorded  34.95    Nutritional Status  BMI > 30  Obese    Nutritional Risks  None    Diabetes  No      Functional Status   Activities of Daily Living  Independent    Ambulation  Independent    Medication Administration  Independent    Home Management  Independent      Risk/Barriers   Barriers to Care Management & Learning  Language Speaks [place].   Speaks [place].     Abuse/Neglect   Do you feel unsafe in your current relationship?  No    Do you feel physically threatened by others?  No    Anyone hurting you at home, work, or school?  No    Unable to ask?  No    Information provided on Community resources  No      Patient Literacy   How often do you need to have someone help you when you read instructions, pamphlets, or other written materials from your doctor or pharmacy?  5 - Always    What is the last grade level you completed in school?  No school.      Investment banker, operational Needed?  Yes    [place]    Interpreter Name  [place]    Patient Declined Interpreter   No      Comments   Information entered by :  Lanell Persons 12/10/17@ 1402 PM

## 2017-12-26 ENCOUNTER — Telehealth: Payer: Self-pay

## 2017-12-26 NOTE — Telephone Encounter (Signed)
refilled 

## 2017-12-26 NOTE — Telephone Encounter (Signed)
Phone call to patient to advise him that [facility] will deliver his Allopurinol and Furosemide tomorrow morning.  [name] RN, Congregational Nurse [phone]

## 2017-12-31 NOTE — Congregational Nurse Program (Signed)
Home visit to explain medication change per order of Dr. Justin Mend. He needs to increase the Calcitriol from 0.25mg  to 0.50 mg per day.  Pill box was adjusted to increase dose.  Also told him that he needs to resume his Procrit shots due to a decrease in his hemoglobin.  I will let him know when that gets rescheduled. He expressed understanding. Interpreter [name] contacted and she will restate this information to him via phone.  [name] RN, Congregational nurse [phone]

## 2017-12-31 NOTE — Congregational Nurse Program (Signed)
Congregational Nurse Program Note  Date of Encounter: 12/31/2017  Past Medical History: Past Medical History:  Diagnosis Date  . Barrett's esophagus   . CKD (chronic kidney disease) stage 4, GFR 15-29 ml/min (HCC)    Followed by [facility].  Thought to be due to long-standing HTN  . Edema   . Essential hypertension   . GERD (gastroesophageal reflux disease)   . Gout 05/04/2009   Qualifier: Diagnosis of  By: Dyann Kief MD, Matthias Hughs Details: CNP Questionnaire - 12/31/17 1500      Questionnaire   Patient Status  Refugee    Race  Asian    Location Patient Served At  Not Applicable    Insurance  Medicaid;Medicare    Uninsured  Not Applicable    Food  No food insecurities    Housing/Utilities  Yes, have permanent housing    Transportation  No transportation needs    Interpersonal Safety  Yes, feel physically and emotionally safe where you currently live    Medication  No medication insecurities    Medical Provider  Yes    Referrals  Not Applicable    ED Visit Averted  Not Applicable    Life-Saving Intervention Made  Not Applicable

## 2018-01-16 ENCOUNTER — Encounter (HOSPITAL_COMMUNITY)
Admission: RE | Admit: 2018-01-16 | Discharge: 2018-01-16 | Disposition: A | Discharge status: Home / Self Care | Payer: Medicare Other | Source: Ambulatory Visit | Attending: Nephrology | Admitting: Nephrology

## 2018-01-16 VITALS — BP 143/78 | HR 89 | Temp 98.3°F | Resp 18

## 2018-01-16 DIAGNOSIS — N185 Chronic kidney disease, stage 5: Secondary | ICD-10-CM

## 2018-01-16 DIAGNOSIS — N183 Chronic kidney disease, stage 3 (moderate): Secondary | ICD-10-CM | POA: Diagnosis not present

## 2018-01-16 DIAGNOSIS — D631 Anemia in chronic kidney disease: Secondary | ICD-10-CM | POA: Diagnosis not present

## 2018-01-16 LAB — IRON AND TIBC
IRON: 83 ug/dL (ref 45–182)
SATURATION RATIOS: 31 % (ref 17.9–39.5)
TIBC: 269 ug/dL (ref 250–450)
UIBC: 186 ug/dL

## 2018-01-16 LAB — FERRITIN: Ferritin: 122 ng/mL (ref 24–336)

## 2018-01-16 LAB — POCT HEMOGLOBIN-HEMACUE: Hemoglobin: 8.1 g/dL — ABNORMAL LOW (ref 13.0–17.0)

## 2018-01-16 MED ORDER — EPOETIN ALFA 10000 UNIT/ML IJ SOLN
INTRAMUSCULAR | Status: AC
  Administered 2018-01-16: 15:00:00 10000 [IU] via SUBCUTANEOUS
  Filled 2018-01-16: qty 1

## 2018-01-16 MED ORDER — EPOETIN ALFA 10000 UNIT/ML IJ SOLN
10000.0000 [IU] | INTRAMUSCULAR | Status: DC
Start: 2018-01-16 — End: 2018-01-17
  Administered 2018-01-16: 10000 [IU] via SUBCUTANEOUS

## 2018-01-16 NOTE — Congregational Nurse Program (Signed)
Home visit.  Filled pill boxes and called in refill request for Sodium Bicarb and Calcitriol.  [facility] will deliver tomorrow afternoon.  Patient was seen today at [facility] and received Procrit injection.  [name] RN, Congregational Nurse [phone]

## 2018-01-29 DIAGNOSIS — N2581 Secondary hyperparathyroidism of renal origin: Secondary | ICD-10-CM | POA: Diagnosis not present

## 2018-01-30 ENCOUNTER — Ambulatory Visit (HOSPITAL_COMMUNITY)
Admission: RE | Admit: 2018-01-30 | Discharge: 2018-01-30 | Disposition: A | Discharge status: Home / Self Care | Payer: Medicare Other | Source: Ambulatory Visit | Attending: Nephrology | Admitting: Nephrology

## 2018-01-30 VITALS — BP 152/76 | HR 96 | Temp 98.3°F | Resp 20

## 2018-01-30 DIAGNOSIS — N185 Chronic kidney disease, stage 5: Secondary | ICD-10-CM | POA: Diagnosis not present

## 2018-01-30 DIAGNOSIS — D631 Anemia in chronic kidney disease: Secondary | ICD-10-CM | POA: Insufficient documentation

## 2018-01-30 LAB — POCT HEMOGLOBIN-HEMACUE: HEMOGLOBIN: 9.2 g/dL — AB (ref 13.0–17.0)

## 2018-01-30 MED ORDER — EPOETIN ALFA 10000 UNIT/ML IJ SOLN
INTRAMUSCULAR | Status: AC
  Administered 2018-01-30: 10000 [IU] via SUBCUTANEOUS
  Filled 2018-01-30: qty 1

## 2018-01-30 MED ORDER — EPOETIN ALFA 10000 UNIT/ML IJ SOLN
10000.0000 [IU] | INTRAMUSCULAR | Status: DC
  Administered 2018-01-30: 10000 [IU] via SUBCUTANEOUS

## 2018-02-06 NOTE — Congregational Nurse Program (Signed)
Home visit with interpreter [name] assisting.  We told patient that Dr. Jason Nest office called regarding lab results from last week at [facility]. His parathyroid level is still low and he needs to increase Calcitriol from 0.5 to 1.0 meq per day.  CN adjusted pill boxes to reflect new dosage.  Also informed him that he needs to have repeat lab work in 2 weeks.  [name] RN, Congregational Nurse [phone]

## 2018-02-13 ENCOUNTER — Ambulatory Visit (HOSPITAL_COMMUNITY)
Admission: RE | Admit: 2018-02-13 | Discharge: 2018-02-13 | Disposition: A | Discharge status: Home / Self Care | Payer: Medicare Other | Source: Ambulatory Visit | Attending: Nephrology | Admitting: Nephrology

## 2018-02-13 VITALS — BP 168/80 | HR 108 | Temp 98.7°F | Resp 20

## 2018-02-13 DIAGNOSIS — N183 Chronic kidney disease, stage 3 (moderate): Secondary | ICD-10-CM | POA: Diagnosis not present

## 2018-02-13 DIAGNOSIS — D631 Anemia in chronic kidney disease: Secondary | ICD-10-CM | POA: Diagnosis not present

## 2018-02-13 DIAGNOSIS — N185 Chronic kidney disease, stage 5: Secondary | ICD-10-CM

## 2018-02-13 LAB — IRON AND TIBC
Iron: 56 ug/dL (ref 45–182)
SATURATION RATIOS: 18 % (ref 17.9–39.5)
TIBC: 308 ug/dL (ref 250–450)
UIBC: 252 ug/dL

## 2018-02-13 LAB — FERRITIN: Ferritin: 171 ng/mL (ref 24–336)

## 2018-02-13 LAB — POCT HEMOGLOBIN-HEMACUE: Hemoglobin: 9.7 g/dL — ABNORMAL LOW (ref 13.0–17.0)

## 2018-02-13 MED ORDER — EPOETIN ALFA 10000 UNIT/ML IJ SOLN
10000.0000 [IU] | INTRAMUSCULAR | Status: DC
  Administered 2018-02-13: 10000 [IU] via SUBCUTANEOUS

## 2018-02-13 MED ORDER — EPOETIN ALFA 10000 UNIT/ML IJ SOLN
INTRAMUSCULAR | Status: AC
  Administered 2018-02-13: 10000 [IU] via SUBCUTANEOUS
  Filled 2018-02-13: qty 1

## 2018-02-27 ENCOUNTER — Encounter (HOSPITAL_COMMUNITY)
Admission: RE | Admit: 2018-02-27 | Discharge: 2018-02-27 | Disposition: A | Discharge status: Home / Self Care | Payer: Medicare Other | Source: Ambulatory Visit | Attending: Nephrology | Admitting: Nephrology

## 2018-02-27 VITALS — BP 154/78 | HR 94 | Temp 98.1°F | Resp 20

## 2018-02-27 DIAGNOSIS — N183 Chronic kidney disease, stage 3 (moderate): Secondary | ICD-10-CM | POA: Diagnosis not present

## 2018-02-27 DIAGNOSIS — N185 Chronic kidney disease, stage 5: Secondary | ICD-10-CM

## 2018-02-27 DIAGNOSIS — D631 Anemia in chronic kidney disease: Secondary | ICD-10-CM | POA: Diagnosis not present

## 2018-02-27 LAB — RENAL FUNCTION PANEL
Albumin: 3.2 g/dL — ABNORMAL LOW (ref 3.5–5.0)
Anion gap: 8 (ref 5–15)
BUN: 53 mg/dL — AB (ref 8–23)
CHLORIDE: 112 mmol/L — AB (ref 98–111)
CO2: 22 mmol/L (ref 22–32)
CREATININE: 5.58 mg/dL — AB (ref 0.61–1.24)
Calcium: 7.8 mg/dL — ABNORMAL LOW (ref 8.9–10.3)
GFR calc non Af Amer: 9 mL/min — ABNORMAL LOW (ref >60)
GFR, EST AFRICAN AMERICAN: 11 mL/min — AB (ref >60)
GLUCOSE: 129 mg/dL — AB (ref 70–99)
Phosphorus: 3.7 mg/dL (ref 2.5–4.6)
Potassium: 4 mmol/L (ref 3.5–5.1)
Sodium: 142 mmol/L (ref 135–145)

## 2018-02-27 LAB — POCT HEMOGLOBIN-HEMACUE: Hemoglobin: 9.3 g/dL — ABNORMAL LOW (ref 13.0–17.0)

## 2018-02-27 MED ORDER — EPOETIN ALFA 10000 UNIT/ML IJ SOLN
INTRAMUSCULAR | Status: AC
  Administered 2018-02-27: 10000 [IU] via SUBCUTANEOUS
  Filled 2018-02-27: qty 1

## 2018-02-27 MED ORDER — EPOETIN ALFA 10000 UNIT/ML IJ SOLN
10000.0000 [IU] | INTRAMUSCULAR | Status: DC
  Administered 2018-02-27: 10000 [IU] via SUBCUTANEOUS

## 2018-03-01 LAB — PARATHYROID HORMONE, INTACT (NO CA): PTH: 273 pg/mL — ABNORMAL HIGH (ref 15–65)

## 2018-03-06 NOTE — Congregational Nurse Program (Signed)
Home visit with interpreter [name].  Patient only has 2 days of Calcitriol left.  Phone call to pharmacy and Dr. Jason Nest office.  [facility] will deliver medication tomorrow between 10 am and 12 noon. Patient's feet, ankles and legs edematous, as well as left arm below fistula.  States he has no pain.  Has eaten salty fish for 2 days.  Reminded him to limit salt intake and advised to elevate feet.  Phone call to nurse [facility] at Dr. Jason Nest office to report previous information.  CN will revisit in 2 days to recheck BP and check edema.  BP today is 168/88.  States he is taking medication as ordered.  [name] RN, Congregational Nurse [phone]

## 2018-03-09 NOTE — Congregational Nurse Program (Signed)
Home visit to recheck BP and reassess edema in feet and ankles.  BP 144/78.  Feet and ankles still swollen but mildly improved.  Patient denies SOB, fatigue or pain.  States he is limiting salt.  Reminded him of appointment with [place] [facility] on 03/13/2018 at 2:45 pm.  CN will report BP reading to Dr. Jason Nest office. [name] RN, Congregational Nurse [phone]

## 2018-03-13 ENCOUNTER — Ambulatory Visit (HOSPITAL_COMMUNITY)
Admission: RE | Admit: 2018-03-13 | Discharge: 2018-03-13 | Disposition: A | Discharge status: Home / Self Care | Payer: Medicare Other | Source: Ambulatory Visit | Attending: Nephrology | Admitting: Nephrology

## 2018-03-13 VITALS — BP 163/72 | HR 98 | Temp 98.4°F | Resp 20

## 2018-03-13 DIAGNOSIS — N185 Chronic kidney disease, stage 5: Secondary | ICD-10-CM | POA: Insufficient documentation

## 2018-03-13 LAB — IRON AND TIBC
Iron: 103 ug/dL (ref 45–182)
Saturation Ratios: 34 % (ref 17.9–39.5)
TIBC: 300 ug/dL (ref 250–450)
UIBC: 197 ug/dL

## 2018-03-13 LAB — POCT HEMOGLOBIN-HEMACUE: HEMOGLOBIN: 10 g/dL — AB (ref 13.0–17.0)

## 2018-03-13 LAB — FERRITIN: Ferritin: 227 ng/mL (ref 24–336)

## 2018-03-13 MED ORDER — EPOETIN ALFA 10000 UNIT/ML IJ SOLN
INTRAMUSCULAR | Status: AC
  Administered 2018-03-13: 10000 [IU] via SUBCUTANEOUS
  Filled 2018-03-13: qty 1

## 2018-03-13 MED ORDER — EPOETIN ALFA 10000 UNIT/ML IJ SOLN
10000.0000 [IU] | INTRAMUSCULAR | Status: DC
  Administered 2018-03-13: 10000 [IU] via SUBCUTANEOUS

## 2018-03-13 NOTE — Congregational Nurse Program (Signed)
CN accompanied patient to appointment at [facility].  His Hgb was 10. Received Procrit injection. BP was 163/72 upon arrival after walking down 2 long hallways.  Recheck 5 minutes later was 149/78.  Patient's feet, ankles and legs continue to have 1+ pitting edema. Denies pain.  Continues to limit salt intake and take medication as prescribed.  CN will contact Dr. Jason Nest office tomorrow to report these problems.  Patient returns to [facility]  Day on 03/27/2018 at 2:45 pm. Scheduled to see Dr. Justin Mend at [facility] on 04/03/2018 at 2:15 pm.  [name] RN, [place] Nurse [phone]

## 2018-03-18 NOTE — Congregational Nurse Program (Signed)
Home visit.  Informed patient that Dr. Jason Nest office was contacted about the swelling in his feet and legs and elevated BP readings and that he wants him to double his BP medication.  Showed patient Furosemide 40 mg bottle and explained that he is to start taking 2 tablets 2 times a day instead of 1 tablet 2 times a day.  Gave him a second pill to complete today's AM dose and filled pill boxes for 2 weeks with new dosage.  Reminded him of follow-up appointment with Dr. Justin Mend on April 03, 2018 and told him interpreter [facility] and I will check on him in 2 days.  [name] RN, Congregational nurse [phone]

## 2018-03-20 NOTE — Congregational Nurse Program (Signed)
Home visit with interpreter [name].  BP 146/88.  Feet and ankles still swollen but he believes they are better.  He has increased furosemide but apparently forgot to take am dose yesterday. Stressed importance of taking medicine exactly as ordered. Reminded him of appointment with [place] on 03/27/2018.  [name] RN, Congregational Nurse [phone]

## 2018-03-27 ENCOUNTER — Encounter (HOSPITAL_COMMUNITY)
Admission: RE | Admit: 2018-03-27 | Discharge: 2018-03-27 | Disposition: A | Discharge status: Home / Self Care | Payer: Medicare Other | Source: Ambulatory Visit | Attending: Nephrology | Admitting: Nephrology

## 2018-03-27 VITALS — BP 143/79 | HR 95 | Temp 98.2°F | Resp 20

## 2018-03-27 DIAGNOSIS — D631 Anemia in chronic kidney disease: Secondary | ICD-10-CM | POA: Insufficient documentation

## 2018-03-27 DIAGNOSIS — N183 Chronic kidney disease, stage 3 (moderate): Secondary | ICD-10-CM | POA: Insufficient documentation

## 2018-03-27 DIAGNOSIS — N185 Chronic kidney disease, stage 5: Secondary | ICD-10-CM

## 2018-03-27 LAB — POCT HEMOGLOBIN-HEMACUE: Hemoglobin: 10.2 g/dL — ABNORMAL LOW (ref 13.0–17.0)

## 2018-03-27 MED ORDER — EPOETIN ALFA 10000 UNIT/ML IJ SOLN
10000.0000 [IU] | INTRAMUSCULAR | Status: DC
  Administered 2018-03-27: 10000 [IU] via SUBCUTANEOUS

## 2018-03-27 MED ORDER — EPOETIN ALFA 10000 UNIT/ML IJ SOLN
INTRAMUSCULAR | Status: AC
  Administered 2018-03-27: 10000 [IU] via SUBCUTANEOUS
  Filled 2018-03-27: qty 1

## 2018-04-03 ENCOUNTER — Other Ambulatory Visit: Payer: Self-pay | Admitting: Internal Medicine

## 2018-04-03 DIAGNOSIS — N184 Chronic kidney disease, stage 4 (severe): Secondary | ICD-10-CM | POA: Diagnosis not present

## 2018-04-03 DIAGNOSIS — N189 Chronic kidney disease, unspecified: Secondary | ICD-10-CM | POA: Diagnosis not present

## 2018-04-03 DIAGNOSIS — M109 Gout, unspecified: Secondary | ICD-10-CM | POA: Diagnosis not present

## 2018-04-03 DIAGNOSIS — D631 Anemia in chronic kidney disease: Secondary | ICD-10-CM | POA: Diagnosis not present

## 2018-04-03 DIAGNOSIS — N2581 Secondary hyperparathyroidism of renal origin: Secondary | ICD-10-CM | POA: Diagnosis not present

## 2018-04-03 DIAGNOSIS — I129 Hypertensive chronic kidney disease with stage 1 through stage 4 chronic kidney disease, or unspecified chronic kidney disease: Secondary | ICD-10-CM | POA: Diagnosis not present

## 2018-04-03 DIAGNOSIS — K2270 Barrett's esophagus without dysplasia: Secondary | ICD-10-CM | POA: Diagnosis not present

## 2018-04-03 DIAGNOSIS — E785 Hyperlipidemia, unspecified: Secondary | ICD-10-CM | POA: Diagnosis not present

## 2018-04-03 NOTE — Congregational Nurse Program (Signed)
Home visit with interpreter [name].  Filled pill boxes and called in refill request to [facility] for Sodium Bicarb.Edema in feet and ankles no longer present. States he is taking medication as ordered.  Patient was seen today by Dr. Edrick Oh, Nephrologist.  Lab work done and he will follow-up in 3 months.  [name] RN, Congregational Nurse [phone]

## 2018-04-03 NOTE — Telephone Encounter (Signed)
refilled 

## 2018-04-10 ENCOUNTER — Ambulatory Visit (HOSPITAL_COMMUNITY)
Admission: RE | Admit: 2018-04-10 | Discharge: 2018-04-10 | Disposition: A | Discharge status: Home / Self Care | Payer: Medicare Other | Source: Ambulatory Visit | Attending: Nephrology | Admitting: Nephrology

## 2018-04-10 VITALS — BP 141/81 | HR 102 | Temp 98.4°F | Resp 20

## 2018-04-10 DIAGNOSIS — N185 Chronic kidney disease, stage 5: Secondary | ICD-10-CM | POA: Diagnosis not present

## 2018-04-10 LAB — POCT HEMOGLOBIN-HEMACUE: Hemoglobin: 10.2 g/dL — ABNORMAL LOW (ref 13.0–17.0)

## 2018-04-10 LAB — FERRITIN: FERRITIN: 191 ng/mL (ref 24–336)

## 2018-04-10 LAB — IRON AND TIBC
IRON: 66 ug/dL (ref 45–182)
Saturation Ratios: 22 % (ref 17.9–39.5)
TIBC: 304 ug/dL (ref 250–450)
UIBC: 238 ug/dL

## 2018-04-10 MED ORDER — EPOETIN ALFA 10000 UNIT/ML IJ SOLN
10000.0000 [IU] | INTRAMUSCULAR | Status: DC
  Administered 2018-04-10: 10000 [IU] via SUBCUTANEOUS

## 2018-04-10 MED ORDER — EPOETIN ALFA 10000 UNIT/ML IJ SOLN
INTRAMUSCULAR | Status: AC
  Administered 2018-04-10: 10000 [IU] via SUBCUTANEOUS
  Filled 2018-04-10: qty 1

## 2018-04-24 ENCOUNTER — Ambulatory Visit (HOSPITAL_COMMUNITY)
Admission: RE | Admit: 2018-04-24 | Discharge: 2018-04-24 | Disposition: A | Discharge status: Home / Self Care | Payer: Medicare Other | Source: Ambulatory Visit | Attending: Nephrology | Admitting: Nephrology

## 2018-04-24 VITALS — BP 157/95 | HR 97 | Temp 98.0°F | Resp 20

## 2018-04-24 DIAGNOSIS — N183 Chronic kidney disease, stage 3 (moderate): Secondary | ICD-10-CM | POA: Diagnosis not present

## 2018-04-24 DIAGNOSIS — N185 Chronic kidney disease, stage 5: Secondary | ICD-10-CM

## 2018-04-24 DIAGNOSIS — Z5181 Encounter for therapeutic drug level monitoring: Secondary | ICD-10-CM | POA: Diagnosis not present

## 2018-04-24 DIAGNOSIS — D631 Anemia in chronic kidney disease: Secondary | ICD-10-CM | POA: Insufficient documentation

## 2018-04-24 DIAGNOSIS — Z79899 Other long term (current) drug therapy: Secondary | ICD-10-CM | POA: Insufficient documentation

## 2018-04-24 LAB — POCT HEMOGLOBIN-HEMACUE: HEMOGLOBIN: 10.7 g/dL — AB (ref 13.0–17.0)

## 2018-04-24 MED ORDER — EPOETIN ALFA 10000 UNIT/ML IJ SOLN
10000.0000 [IU] | INTRAMUSCULAR | Status: DC
  Administered 2018-04-24: 10000 [IU] via SUBCUTANEOUS

## 2018-04-24 MED ORDER — EPOETIN ALFA 10000 UNIT/ML IJ SOLN
INTRAMUSCULAR | Status: AC
  Filled 2018-04-24: qty 1

## 2018-05-08 ENCOUNTER — Ambulatory Visit (HOSPITAL_COMMUNITY)
Admission: RE | Admit: 2018-05-08 | Discharge: 2018-05-08 | Disposition: A | Discharge status: Home / Self Care | Payer: Medicare Other | Source: Ambulatory Visit | Attending: Nephrology | Admitting: Nephrology

## 2018-05-08 VITALS — BP 153/92 | HR 106 | Temp 98.0°F | Resp 20

## 2018-05-08 DIAGNOSIS — K2270 Barrett's esophagus without dysplasia: Secondary | ICD-10-CM | POA: Diagnosis not present

## 2018-05-08 DIAGNOSIS — D631 Anemia in chronic kidney disease: Secondary | ICD-10-CM | POA: Diagnosis not present

## 2018-05-08 DIAGNOSIS — N185 Chronic kidney disease, stage 5: Secondary | ICD-10-CM | POA: Insufficient documentation

## 2018-05-08 DIAGNOSIS — I120 Hypertensive chronic kidney disease with stage 5 chronic kidney disease or end stage renal disease: Secondary | ICD-10-CM | POA: Diagnosis not present

## 2018-05-08 DIAGNOSIS — M109 Gout, unspecified: Secondary | ICD-10-CM | POA: Diagnosis not present

## 2018-05-08 DIAGNOSIS — N2581 Secondary hyperparathyroidism of renal origin: Secondary | ICD-10-CM | POA: Diagnosis not present

## 2018-05-08 DIAGNOSIS — R739 Hyperglycemia, unspecified: Secondary | ICD-10-CM | POA: Diagnosis not present

## 2018-05-08 DIAGNOSIS — E785 Hyperlipidemia, unspecified: Secondary | ICD-10-CM | POA: Diagnosis not present

## 2018-05-08 LAB — POCT HEMOGLOBIN-HEMACUE: Hemoglobin: 10.5 g/dL — ABNORMAL LOW (ref 13.0–17.0)

## 2018-05-08 LAB — FERRITIN: Ferritin: 199 ng/mL (ref 24–336)

## 2018-05-08 LAB — IRON AND TIBC
Iron: 58 ug/dL (ref 45–182)
Saturation Ratios: 19 % (ref 17.9–39.5)
TIBC: 309 ug/dL (ref 250–450)
UIBC: 251 ug/dL

## 2018-05-08 MED ORDER — EPOETIN ALFA 10000 UNIT/ML IJ SOLN
INTRAMUSCULAR | Status: AC
  Filled 2018-05-08: qty 1

## 2018-05-08 MED ORDER — EPOETIN ALFA 10000 UNIT/ML IJ SOLN
10000.0000 [IU] | INTRAMUSCULAR | Status: DC
  Administered 2018-05-08: 10000 [IU] via SUBCUTANEOUS

## 2018-05-08 NOTE — Congregational Nurse Program (Signed)
Home visit with interpreter [name].  Filled pill boxes and called in prescription refill for calcitriol to [facility].  They will deliver tomorrow morning.  We accompanied patient to visit with Dr. Edrick Oh, Nephrologist.  Patient complains of diarrhea for 2 days, weakness, feeling very tired and loss of appetite.  Also states he has not been sleeping well for about 2 months. Dr. Justin Mend thinks all these symptoms could be caused by his renal failure and decided it is time for patient to start dialysis.  His office will make referral to dialysis center and contact CN to coordinate arrangements. [name] RN, Congregational Nurse [phone]

## 2018-05-23 ENCOUNTER — Encounter (HOSPITAL_COMMUNITY): Payer: Medicare Other

## 2018-05-24 DIAGNOSIS — N186 End stage renal disease: Secondary | ICD-10-CM | POA: Diagnosis not present

## 2018-05-24 DIAGNOSIS — Z992 Dependence on renal dialysis: Secondary | ICD-10-CM | POA: Diagnosis not present

## 2018-05-24 DIAGNOSIS — N2581 Secondary hyperparathyroidism of renal origin: Secondary | ICD-10-CM | POA: Diagnosis not present

## 2018-05-24 DIAGNOSIS — D509 Iron deficiency anemia, unspecified: Secondary | ICD-10-CM | POA: Diagnosis not present

## 2018-05-24 DIAGNOSIS — D631 Anemia in chronic kidney disease: Secondary | ICD-10-CM | POA: Diagnosis not present

## 2018-05-24 DIAGNOSIS — I129 Hypertensive chronic kidney disease with stage 1 through stage 4 chronic kidney disease, or unspecified chronic kidney disease: Secondary | ICD-10-CM | POA: Diagnosis not present

## 2018-05-27 DIAGNOSIS — D631 Anemia in chronic kidney disease: Secondary | ICD-10-CM | POA: Diagnosis not present

## 2018-05-27 DIAGNOSIS — N2581 Secondary hyperparathyroidism of renal origin: Secondary | ICD-10-CM | POA: Diagnosis not present

## 2018-05-27 DIAGNOSIS — D509 Iron deficiency anemia, unspecified: Secondary | ICD-10-CM | POA: Diagnosis not present

## 2018-05-27 DIAGNOSIS — N186 End stage renal disease: Secondary | ICD-10-CM | POA: Diagnosis not present

## 2018-05-29 DIAGNOSIS — N186 End stage renal disease: Secondary | ICD-10-CM | POA: Diagnosis not present

## 2018-05-29 DIAGNOSIS — D509 Iron deficiency anemia, unspecified: Secondary | ICD-10-CM | POA: Diagnosis not present

## 2018-05-29 DIAGNOSIS — N2581 Secondary hyperparathyroidism of renal origin: Secondary | ICD-10-CM | POA: Diagnosis not present

## 2018-05-29 DIAGNOSIS — D631 Anemia in chronic kidney disease: Secondary | ICD-10-CM | POA: Diagnosis not present

## 2018-05-29 NOTE — Congregational Nurse Program (Signed)
Home visit with interpreter [name].  States dialysis is going well except for bruising on arm around site.  He does not have ice or a way to apply warm compresses as directed. We purchased instant ice packs and wash clothes which he could heat with hot water and instructed him to alternate the two per direction of the dialysis center.  He feels better since starting dialysis.  Taking medications as directed.  [name] RN, Congregational Nurse [phone]

## 2018-06-03 DIAGNOSIS — D631 Anemia in chronic kidney disease: Secondary | ICD-10-CM | POA: Diagnosis not present

## 2018-06-03 DIAGNOSIS — D509 Iron deficiency anemia, unspecified: Secondary | ICD-10-CM | POA: Diagnosis not present

## 2018-06-03 DIAGNOSIS — N2581 Secondary hyperparathyroidism of renal origin: Secondary | ICD-10-CM | POA: Diagnosis not present

## 2018-06-03 DIAGNOSIS — N186 End stage renal disease: Secondary | ICD-10-CM | POA: Diagnosis not present

## 2018-06-04 NOTE — Congregational Nurse Program (Signed)
Interpreter [name] and CN accompanied patient to initial appointment at the [facility] [place].  We helped him complete paperwork in order to start dialysis tomorrow at 6:30 am. which will continue every Monday, Wednesday and Friday.  CN arranged transportation thru Medicaid, which will be provided thru [employer] [phone]).  [name] RN, Congregational Nurse [phone]

## 2018-06-05 DIAGNOSIS — N186 End stage renal disease: Secondary | ICD-10-CM | POA: Diagnosis not present

## 2018-06-05 DIAGNOSIS — D509 Iron deficiency anemia, unspecified: Secondary | ICD-10-CM | POA: Diagnosis not present

## 2018-06-05 DIAGNOSIS — N2581 Secondary hyperparathyroidism of renal origin: Secondary | ICD-10-CM | POA: Diagnosis not present

## 2018-06-05 DIAGNOSIS — D631 Anemia in chronic kidney disease: Secondary | ICD-10-CM | POA: Diagnosis not present

## 2018-06-07 DIAGNOSIS — N186 End stage renal disease: Secondary | ICD-10-CM | POA: Diagnosis not present

## 2018-06-07 DIAGNOSIS — D631 Anemia in chronic kidney disease: Secondary | ICD-10-CM | POA: Diagnosis not present

## 2018-06-07 DIAGNOSIS — D509 Iron deficiency anemia, unspecified: Secondary | ICD-10-CM | POA: Diagnosis not present

## 2018-06-07 DIAGNOSIS — N2581 Secondary hyperparathyroidism of renal origin: Secondary | ICD-10-CM | POA: Diagnosis not present

## 2018-06-10 DIAGNOSIS — N186 End stage renal disease: Secondary | ICD-10-CM | POA: Diagnosis not present

## 2018-06-10 DIAGNOSIS — D509 Iron deficiency anemia, unspecified: Secondary | ICD-10-CM | POA: Diagnosis not present

## 2018-06-10 DIAGNOSIS — N2581 Secondary hyperparathyroidism of renal origin: Secondary | ICD-10-CM | POA: Diagnosis not present

## 2018-06-10 DIAGNOSIS — D631 Anemia in chronic kidney disease: Secondary | ICD-10-CM | POA: Diagnosis not present

## 2018-06-12 DIAGNOSIS — D509 Iron deficiency anemia, unspecified: Secondary | ICD-10-CM | POA: Diagnosis not present

## 2018-06-12 DIAGNOSIS — N186 End stage renal disease: Secondary | ICD-10-CM | POA: Diagnosis not present

## 2018-06-12 DIAGNOSIS — N2581 Secondary hyperparathyroidism of renal origin: Secondary | ICD-10-CM | POA: Diagnosis not present

## 2018-06-12 DIAGNOSIS — D631 Anemia in chronic kidney disease: Secondary | ICD-10-CM | POA: Diagnosis not present

## 2018-06-12 NOTE — Congregational Nurse Program (Signed)
Home visit with interpreter Luke Dunn to follow-up on how he is feeling about dialysis.  He complains that his arm hurts during procedure.  He received Lidocaine ointment in the mail a week or 2 ago but did not know how to use it or what it was for.  After we explained purpose and instructions he stated he would start using it Friday morning before his next treatment.  He seems discouraged/?depressed that he is feeling weaker and has no strength/energy. States he is ready to give up and let God take him.  We encouraged and prayed for him to keep trying and with time he should feel better. Also says he is still not sleeping at night.  Encouraged him to let the nurse at the dialysis center know this.  Also states his right knee hurts when walking.  Will follow-up in 1 week.  [name] RN, Congregational nurse [phone]

## 2018-06-14 DIAGNOSIS — N2581 Secondary hyperparathyroidism of renal origin: Secondary | ICD-10-CM | POA: Diagnosis not present

## 2018-06-14 DIAGNOSIS — N186 End stage renal disease: Secondary | ICD-10-CM | POA: Diagnosis not present

## 2018-06-14 DIAGNOSIS — D631 Anemia in chronic kidney disease: Secondary | ICD-10-CM | POA: Diagnosis not present

## 2018-06-14 DIAGNOSIS — D509 Iron deficiency anemia, unspecified: Secondary | ICD-10-CM | POA: Diagnosis not present

## 2018-06-17 DIAGNOSIS — D631 Anemia in chronic kidney disease: Secondary | ICD-10-CM | POA: Diagnosis not present

## 2018-06-17 DIAGNOSIS — N186 End stage renal disease: Secondary | ICD-10-CM | POA: Diagnosis not present

## 2018-06-17 DIAGNOSIS — D509 Iron deficiency anemia, unspecified: Secondary | ICD-10-CM | POA: Diagnosis not present

## 2018-06-17 DIAGNOSIS — N2581 Secondary hyperparathyroidism of renal origin: Secondary | ICD-10-CM | POA: Diagnosis not present

## 2018-06-19 NOTE — Congregational Nurse Program (Signed)
Home visit with interpreter [name].  Reminded patient of upcoming appointment with PCP at [facility] clinic on Monday June 24, 2018 at 1:45 pm.  We asked him if the Lidocaine gel had helped the pain in his arm during dialysis and he said it does not help.  We told him to let one of the nurses or doctors at the center know this.  He states on Friday 06/15/2018 he passed out in the hallway outside his apartment when he was dropped off after dialysis.  CN called [facility]  [employer] to inform them and they will escort him up the steps to his apartment door in the future.  He states he told a doctor at the dialysis center about this episode.  Patient stated he is tired of being sick and has lost hope of ever seeing his family in [place] again.  He believes he can never go back because of the role he played helping [place] during the [place] War.  He is unable to sleep and has had suicidal thoughts.  States he has considered hanging himself.  He agreed/promised not to harm himself before Monday and will talk to his doctor about his problems.  [name] RN, Congregational Nurse  [phone]

## 2018-06-21 NOTE — Congregational Nurse Program (Signed)
Home visit following phone call from Chief Financial Officer at [facility].  Patient did not show up for dialysis today or Wednesday.  Patient does not answer phone.  Home visit made to determine patient's well being.  He appears to be in no distress.  States he does not want to continue dialysis because it is painful.  He says he understands that he may not live long without it and that he is ok with this.  CN asked him again to consider possibility of returning to [place] to see his family before he dies.  He agreed to see his PCP on Monday and to continue taking his BP, Gout, and stomach medicines until we can clarify orders for other medicines at that appointment.  We will talk to PCP about Hospice referral by kidney doctor (Dr. Justin Mend). [name] RN, Congregational Nurse [phone]

## 2018-06-22 NOTE — Progress Notes (Signed)
CC: follow up CKD 5 HPI:  [name] is a 68 y.o. male with PMH below.  Today we will address  CKD 5.    Please see A&P for status of the patient's chronic medical conditions  Past Medical History:  Diagnosis Date  . Barrett's esophagus   . CKD (chronic kidney disease) stage 4, GFR 15-29 ml/min (HCC)    Followed by [facility].  Thought to be due to long-standing HTN  . Edema   . Essential hypertension   . GERD (gastroesophageal reflux disease)   . Gout 05/04/2009   Qualifier: Diagnosis of  By: Dyann Kief MD, Clifton James     Review of Systems:  ROS: Pulmonary: pt denies increased work of breathing, shortness of breath,  Cardiac: pt denies palpitations, chest pain,  Abdominal: pt denies abdominal pain, nausea, vomiting, or diarrhea  Physical Exam:  Vitals:   06/24/18 1328  BP: 129/70  Pulse: 94  Temp: 98.8 F (37.1 C)  TempSrc: Oral  SpO2: 99%  Weight: 190 lb 12.8 oz (86.5 kg)  Height: 5\' 3"  (1.6 m)   Cardiac: normal rate and rhythm, clear s1 and s2, no murmurs, rubs or gallops Pulmonary: CTAB, not in distress Abdominal: non distended abdomen, soft and nontender Extremities: no LE edema Psych: Alert, conversant, in good spirits   Social History   Socioeconomic History  . Marital status: Single    Spouse name: Not on file  . Number of children: Not on file  . Years of education: Not on file  . Highest education level: Not on file  Occupational History  . Not on file  Social Needs  . Financial resource strain: Not on file  . Food insecurity:    Worry: Not on file    Inability: Not on file  . Transportation needs:    Medical: Not on file    Non-medical: Not on file  Tobacco Use  . Smoking status: Former Smoker    Packs/day: 2.00    Years: 37.00    Pack years: 74.00    Last attempt to quit: 02/01/2007    Years since quitting: 11.4  . Smokeless tobacco: Former Systems developer    Quit date: 02/01/2007  Substance and Sexual Activity  . Alcohol use: No   Alcohol/week: 0.0 standard drinks  . Drug use: No  . Sexual activity: Not on file  Lifestyle  . Physical activity:    Days per week: Not on file    Minutes per session: Not on file  . Stress: Not on file  Relationships  . Social connections:    Talks on phone: Not on file    Gets together: Not on file    Attends religious service: Not on file    Active member of club or organization: Not on file    Attends meetings of clubs or organizations: Not on file    Relationship status: Not on file  . Intimate partner violence:    Fear of current or ex partner: Not on file    Emotionally abused: Not on file    Physically abused: Not on file    Forced sexual activity: Not on file  Other Topics Concern  . Not on file  Social History Narrative   [name] speaking only, low educational level, works in Architect.  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       Financial assistance approved for 70% discount at [facility] and has [facility] card.     History reviewed. No pertinent family history.  Assessment &  Plan:   See Encounters Tab for problem based charting.  Patient discussed with Dr. Angelia Mould

## 2018-06-24 ENCOUNTER — Ambulatory Visit (INDEPENDENT_AMBULATORY_CARE_PROVIDER_SITE_OTHER): Payer: Medicare Other | Admitting: Internal Medicine

## 2018-06-24 ENCOUNTER — Encounter: Payer: Self-pay | Admitting: Internal Medicine

## 2018-06-24 ENCOUNTER — Telehealth: Payer: Self-pay

## 2018-06-24 ENCOUNTER — Other Ambulatory Visit: Payer: Self-pay

## 2018-06-24 VITALS — BP 129/70 | HR 94 | Temp 98.8°F | Ht 63.0 in | Wt 190.8 lb

## 2018-06-24 DIAGNOSIS — Z87891 Personal history of nicotine dependence: Secondary | ICD-10-CM

## 2018-06-24 DIAGNOSIS — N186 End stage renal disease: Secondary | ICD-10-CM

## 2018-06-24 NOTE — Assessment & Plan Note (Signed)
Discussed more about patients wishes in regard to dialysis and his trajectory.  Our last visit he wasn't interested in dialysis.  Since then, he has attempted dialysis working with Dr. Justin Mend at [facility].  It did not go well he felt very poorly while doing dialysis and on one instance he passed out afterwards.  He spoke to Dr. Justin Mend and emphasized that his wishes were to discontinue dialysis.  Hospice care referral was placed by Dr. Justin Mend.  We talked about the patient's wishes in depth today.  It is clear he understands that ending his dialysis sessions is a terminal event and he states that he is ready to die at this point.  He feels he has lived a good life.    -Made clear to the patient that I support his decision and it is reasonable one -Agree with hospice care outpatient -We will support patient through this transition and if any needs arise will always be available to him at our clinic

## 2018-06-24 NOTE — Patient Instructions (Signed)
Mr. Reeves I want to reemphasize that I support your decision to discontinue dialysis.  We will be here for you with anything that she need as you are transitioned to hospice care.

## 2018-06-24 NOTE — Congregational Nurse Program (Signed)
CN accompanied patient to follow-up visit with PCP Dr. Shan Levans at [facility].  We informed doctor of patient's decision to stop dialysis. We told him Dr. Justin Mend has been notified and that a referral to Hospice has been made to provide end-of-life care.  Dr. Shan Levans stated that he supported patient's decision and that he will be available for any future needs.  Patient will continue medicines for BP, Gout and Barrett's Esophagus. Patient is comfortable that his is making the correct decision and says he is ready to die.  [name] RN, Congregational Nurse [phone]

## 2018-06-24 NOTE — Telephone Encounter (Signed)
Phone call from Hospice to schedule initial appointment for a home visit with one of their nurses for an assessment.  Scheduled for Wednesday 06/26/2018 at 2:30 pm.  CN and interpreter [name] will meet Hospice nurse there.  After initial visit Hospice will contact Language Resources and request interpreter [place].  [name] RN, Congregational Nurse [phone]

## 2018-06-26 DIAGNOSIS — M109 Gout, unspecified: Secondary | ICD-10-CM | POA: Diagnosis not present

## 2018-06-26 DIAGNOSIS — N189 Chronic kidney disease, unspecified: Secondary | ICD-10-CM | POA: Diagnosis not present

## 2018-06-26 DIAGNOSIS — K219 Gastro-esophageal reflux disease without esophagitis: Secondary | ICD-10-CM | POA: Diagnosis not present

## 2018-06-26 DIAGNOSIS — I10 Essential (primary) hypertension: Secondary | ICD-10-CM | POA: Diagnosis not present

## 2018-06-26 NOTE — Progress Notes (Signed)
Internal Medicine Clinic Attending  Case discussed with Dr. Winfrey  at the time of the visit.  We reviewed the resident's history and exam and pertinent patient test results.  I agree with the assessment, diagnosis, and plan of care documented in the resident's note.  

## 2018-06-26 NOTE — Congregational Nurse Program (Signed)
Home visit with interpreter [name] to [place] with initial assessment to determine eligibility for Hospice services.  Patient was deemed eligible for services and will receive a visit by his nurse Chrislyn and Social Worker Katie at the end of the week.  CN completed food stamp renewal paperwork.  Patient was in good spirits today and no longer appears depressed.  He is happy with his decision to stop treatment and requested a DNR order which the social worker will bring.  He also wants to request Meals on Wheels when she comes.  CN will be available to Hospice whenever needed and will continue visiting patient for emotional support and any issues which do not fall under the realm of Hospice care.  [name] RN, Congregational nurse [phone]

## 2018-06-28 DIAGNOSIS — M109 Gout, unspecified: Secondary | ICD-10-CM | POA: Diagnosis not present

## 2018-06-28 DIAGNOSIS — N189 Chronic kidney disease, unspecified: Secondary | ICD-10-CM | POA: Diagnosis not present

## 2018-06-28 DIAGNOSIS — I10 Essential (primary) hypertension: Secondary | ICD-10-CM | POA: Diagnosis not present

## 2018-06-28 DIAGNOSIS — K219 Gastro-esophageal reflux disease without esophagitis: Secondary | ICD-10-CM | POA: Diagnosis not present

## 2018-06-29 DIAGNOSIS — I10 Essential (primary) hypertension: Secondary | ICD-10-CM | POA: Diagnosis not present

## 2018-06-29 DIAGNOSIS — M109 Gout, unspecified: Secondary | ICD-10-CM | POA: Diagnosis not present

## 2018-06-29 DIAGNOSIS — K219 Gastro-esophageal reflux disease without esophagitis: Secondary | ICD-10-CM | POA: Diagnosis not present

## 2018-06-29 DIAGNOSIS — N189 Chronic kidney disease, unspecified: Secondary | ICD-10-CM | POA: Diagnosis not present

## 2018-06-30 DIAGNOSIS — K219 Gastro-esophageal reflux disease without esophagitis: Secondary | ICD-10-CM | POA: Diagnosis not present

## 2018-06-30 DIAGNOSIS — M109 Gout, unspecified: Secondary | ICD-10-CM | POA: Diagnosis not present

## 2018-06-30 DIAGNOSIS — N189 Chronic kidney disease, unspecified: Secondary | ICD-10-CM | POA: Diagnosis not present

## 2018-06-30 DIAGNOSIS — I10 Essential (primary) hypertension: Secondary | ICD-10-CM | POA: Diagnosis not present

## 2018-07-02 DIAGNOSIS — K219 Gastro-esophageal reflux disease without esophagitis: Secondary | ICD-10-CM | POA: Diagnosis not present

## 2018-07-02 DIAGNOSIS — N189 Chronic kidney disease, unspecified: Secondary | ICD-10-CM | POA: Diagnosis not present

## 2018-07-02 DIAGNOSIS — M109 Gout, unspecified: Secondary | ICD-10-CM | POA: Diagnosis not present

## 2018-07-02 DIAGNOSIS — I10 Essential (primary) hypertension: Secondary | ICD-10-CM | POA: Diagnosis not present

## 2018-07-09 DIAGNOSIS — M109 Gout, unspecified: Secondary | ICD-10-CM | POA: Diagnosis not present

## 2018-07-09 DIAGNOSIS — I10 Essential (primary) hypertension: Secondary | ICD-10-CM | POA: Diagnosis not present

## 2018-07-09 DIAGNOSIS — K219 Gastro-esophageal reflux disease without esophagitis: Secondary | ICD-10-CM | POA: Diagnosis not present

## 2018-07-09 DIAGNOSIS — N189 Chronic kidney disease, unspecified: Secondary | ICD-10-CM | POA: Diagnosis not present

## 2018-07-10 DIAGNOSIS — K219 Gastro-esophageal reflux disease without esophagitis: Secondary | ICD-10-CM | POA: Diagnosis not present

## 2018-07-10 DIAGNOSIS — N189 Chronic kidney disease, unspecified: Secondary | ICD-10-CM | POA: Diagnosis not present

## 2018-07-10 DIAGNOSIS — I10 Essential (primary) hypertension: Secondary | ICD-10-CM | POA: Diagnosis not present

## 2018-07-10 DIAGNOSIS — M109 Gout, unspecified: Secondary | ICD-10-CM | POA: Diagnosis not present

## 2018-07-16 DIAGNOSIS — N189 Chronic kidney disease, unspecified: Secondary | ICD-10-CM | POA: Diagnosis not present

## 2018-07-16 DIAGNOSIS — M109 Gout, unspecified: Secondary | ICD-10-CM | POA: Diagnosis not present

## 2018-07-16 DIAGNOSIS — K219 Gastro-esophageal reflux disease without esophagitis: Secondary | ICD-10-CM | POA: Diagnosis not present

## 2018-07-16 DIAGNOSIS — I10 Essential (primary) hypertension: Secondary | ICD-10-CM | POA: Diagnosis not present

## 2018-07-21 DIAGNOSIS — K219 Gastro-esophageal reflux disease without esophagitis: Secondary | ICD-10-CM | POA: Diagnosis not present

## 2018-07-21 DIAGNOSIS — I10 Essential (primary) hypertension: Secondary | ICD-10-CM | POA: Diagnosis not present

## 2018-07-21 DIAGNOSIS — N189 Chronic kidney disease, unspecified: Secondary | ICD-10-CM | POA: Diagnosis not present

## 2018-07-21 DIAGNOSIS — M109 Gout, unspecified: Secondary | ICD-10-CM | POA: Diagnosis not present

## 2018-07-23 DIAGNOSIS — I10 Essential (primary) hypertension: Secondary | ICD-10-CM | POA: Diagnosis not present

## 2018-07-23 DIAGNOSIS — N189 Chronic kidney disease, unspecified: Secondary | ICD-10-CM | POA: Diagnosis not present

## 2018-07-23 DIAGNOSIS — M109 Gout, unspecified: Secondary | ICD-10-CM | POA: Diagnosis not present

## 2018-07-23 DIAGNOSIS — K219 Gastro-esophageal reflux disease without esophagitis: Secondary | ICD-10-CM | POA: Diagnosis not present

## 2018-07-30 DIAGNOSIS — I10 Essential (primary) hypertension: Secondary | ICD-10-CM | POA: Diagnosis not present

## 2018-07-30 DIAGNOSIS — M109 Gout, unspecified: Secondary | ICD-10-CM | POA: Diagnosis not present

## 2018-07-30 DIAGNOSIS — K219 Gastro-esophageal reflux disease without esophagitis: Secondary | ICD-10-CM | POA: Diagnosis not present

## 2018-07-30 DIAGNOSIS — N189 Chronic kidney disease, unspecified: Secondary | ICD-10-CM | POA: Diagnosis not present

## 2018-08-06 DIAGNOSIS — M109 Gout, unspecified: Secondary | ICD-10-CM | POA: Diagnosis not present

## 2018-08-06 DIAGNOSIS — I10 Essential (primary) hypertension: Secondary | ICD-10-CM | POA: Diagnosis not present

## 2018-08-06 DIAGNOSIS — K219 Gastro-esophageal reflux disease without esophagitis: Secondary | ICD-10-CM | POA: Diagnosis not present

## 2018-08-06 DIAGNOSIS — N189 Chronic kidney disease, unspecified: Secondary | ICD-10-CM | POA: Diagnosis not present

## 2018-08-06 NOTE — Congregational Nurse Program (Signed)
Home visit with Hospice Nurse Chrislyn Edison Pace and interpreter [facility].  Patient states nothing has changed since last visit with Hospice Nurse 1 week ago.  He complains of being tired and knee pain.  His vital signs are stable. Patient stated he never wants to resume dialysis.  Discussed symptoms of Covid-19 and what patient should do if he develops cough or fever.  [name] RN, Congregational Nurse [phone]

## 2018-08-13 DIAGNOSIS — M109 Gout, unspecified: Secondary | ICD-10-CM | POA: Diagnosis not present

## 2018-08-13 DIAGNOSIS — I10 Essential (primary) hypertension: Secondary | ICD-10-CM | POA: Diagnosis not present

## 2018-08-13 DIAGNOSIS — N189 Chronic kidney disease, unspecified: Secondary | ICD-10-CM | POA: Diagnosis not present

## 2018-08-13 DIAGNOSIS — K219 Gastro-esophageal reflux disease without esophagitis: Secondary | ICD-10-CM | POA: Diagnosis not present

## 2018-08-20 DIAGNOSIS — N189 Chronic kidney disease, unspecified: Secondary | ICD-10-CM | POA: Diagnosis not present

## 2018-08-20 DIAGNOSIS — I10 Essential (primary) hypertension: Secondary | ICD-10-CM | POA: Diagnosis not present

## 2018-08-20 DIAGNOSIS — K219 Gastro-esophageal reflux disease without esophagitis: Secondary | ICD-10-CM | POA: Diagnosis not present

## 2018-08-20 DIAGNOSIS — M109 Gout, unspecified: Secondary | ICD-10-CM | POA: Diagnosis not present

## 2018-08-21 DIAGNOSIS — M109 Gout, unspecified: Secondary | ICD-10-CM | POA: Diagnosis not present

## 2018-08-21 DIAGNOSIS — K219 Gastro-esophageal reflux disease without esophagitis: Secondary | ICD-10-CM | POA: Diagnosis not present

## 2018-08-21 DIAGNOSIS — N189 Chronic kidney disease, unspecified: Secondary | ICD-10-CM | POA: Diagnosis not present

## 2018-08-21 DIAGNOSIS — I10 Essential (primary) hypertension: Secondary | ICD-10-CM | POA: Diagnosis not present

## 2018-08-23 ENCOUNTER — Other Ambulatory Visit: Payer: Self-pay | Admitting: Student in an Organized Health Care Education/Training Program

## 2018-08-23 DIAGNOSIS — M109 Gout, unspecified: Secondary | ICD-10-CM | POA: Diagnosis not present

## 2018-08-23 DIAGNOSIS — K219 Gastro-esophageal reflux disease without esophagitis: Secondary | ICD-10-CM | POA: Diagnosis not present

## 2018-08-23 DIAGNOSIS — I10 Essential (primary) hypertension: Secondary | ICD-10-CM | POA: Diagnosis not present

## 2018-08-23 DIAGNOSIS — N189 Chronic kidney disease, unspecified: Secondary | ICD-10-CM | POA: Diagnosis not present

## 2018-08-23 NOTE — Telephone Encounter (Signed)
refilled 

## 2018-08-27 DIAGNOSIS — N189 Chronic kidney disease, unspecified: Secondary | ICD-10-CM | POA: Diagnosis not present

## 2018-08-27 DIAGNOSIS — K219 Gastro-esophageal reflux disease without esophagitis: Secondary | ICD-10-CM | POA: Diagnosis not present

## 2018-08-27 DIAGNOSIS — M109 Gout, unspecified: Secondary | ICD-10-CM | POA: Diagnosis not present

## 2018-08-27 DIAGNOSIS — I10 Essential (primary) hypertension: Secondary | ICD-10-CM | POA: Diagnosis not present

## 2018-08-27 NOTE — Congregational Nurse Program (Signed)
Home visit with Hospice Nurse Chrislyn Edison Pace and interpreter [facility] [place] assisting remotely on I-Pad.  Patient states he feels fine now but at midnight last night had episode of SOB.  He took morphine which was prescribed and said it helped.  Vital signs good today but Nurse Chrislyn stated she could hear crackles in his lungs. He agreed to accept a hospital bed so that he can elevate his head at night.  It will be delivered tomorrow.  His other complaint is pain in his left arm.  He has very engorged blood vessel above sit of fistula.  States pain level is 4.  Hospice doctor was contacted with picture of arm and he stated this is due to fluid overload from kidney failure.  At this point only treatment is pain relief.  CN will visit net week with Hospice Nurse. [name] RN, Congregational Nurse [phone]

## 2018-08-30 DIAGNOSIS — K219 Gastro-esophageal reflux disease without esophagitis: Secondary | ICD-10-CM | POA: Diagnosis not present

## 2018-08-30 DIAGNOSIS — M109 Gout, unspecified: Secondary | ICD-10-CM | POA: Diagnosis not present

## 2018-08-30 DIAGNOSIS — N189 Chronic kidney disease, unspecified: Secondary | ICD-10-CM | POA: Diagnosis not present

## 2018-08-30 DIAGNOSIS — I10 Essential (primary) hypertension: Secondary | ICD-10-CM | POA: Diagnosis not present

## 2018-09-03 DIAGNOSIS — I10 Essential (primary) hypertension: Secondary | ICD-10-CM | POA: Diagnosis not present

## 2018-09-03 DIAGNOSIS — N189 Chronic kidney disease, unspecified: Secondary | ICD-10-CM | POA: Diagnosis not present

## 2018-09-03 DIAGNOSIS — M109 Gout, unspecified: Secondary | ICD-10-CM | POA: Diagnosis not present

## 2018-09-03 DIAGNOSIS — K219 Gastro-esophageal reflux disease without esophagitis: Secondary | ICD-10-CM | POA: Diagnosis not present

## 2018-09-06 DIAGNOSIS — I10 Essential (primary) hypertension: Secondary | ICD-10-CM | POA: Diagnosis not present

## 2018-09-06 DIAGNOSIS — K219 Gastro-esophageal reflux disease without esophagitis: Secondary | ICD-10-CM | POA: Diagnosis not present

## 2018-09-06 DIAGNOSIS — M109 Gout, unspecified: Secondary | ICD-10-CM | POA: Diagnosis not present

## 2018-09-06 DIAGNOSIS — N189 Chronic kidney disease, unspecified: Secondary | ICD-10-CM | POA: Diagnosis not present

## 2018-09-10 DIAGNOSIS — I10 Essential (primary) hypertension: Secondary | ICD-10-CM | POA: Diagnosis not present

## 2018-09-10 DIAGNOSIS — K219 Gastro-esophageal reflux disease without esophagitis: Secondary | ICD-10-CM | POA: Diagnosis not present

## 2018-09-10 DIAGNOSIS — M109 Gout, unspecified: Secondary | ICD-10-CM | POA: Diagnosis not present

## 2018-09-10 DIAGNOSIS — N189 Chronic kidney disease, unspecified: Secondary | ICD-10-CM | POA: Diagnosis not present

## 2018-09-13 DIAGNOSIS — N189 Chronic kidney disease, unspecified: Secondary | ICD-10-CM | POA: Diagnosis not present

## 2018-09-13 DIAGNOSIS — I10 Essential (primary) hypertension: Secondary | ICD-10-CM | POA: Diagnosis not present

## 2018-09-13 DIAGNOSIS — M109 Gout, unspecified: Secondary | ICD-10-CM | POA: Diagnosis not present

## 2018-09-13 DIAGNOSIS — K219 Gastro-esophageal reflux disease without esophagitis: Secondary | ICD-10-CM | POA: Diagnosis not present

## 2018-09-17 DIAGNOSIS — I10 Essential (primary) hypertension: Secondary | ICD-10-CM | POA: Diagnosis not present

## 2018-09-17 DIAGNOSIS — M109 Gout, unspecified: Secondary | ICD-10-CM | POA: Diagnosis not present

## 2018-09-17 DIAGNOSIS — N189 Chronic kidney disease, unspecified: Secondary | ICD-10-CM | POA: Diagnosis not present

## 2018-09-17 DIAGNOSIS — K219 Gastro-esophageal reflux disease without esophagitis: Secondary | ICD-10-CM | POA: Diagnosis not present

## 2018-09-20 DIAGNOSIS — N189 Chronic kidney disease, unspecified: Secondary | ICD-10-CM | POA: Diagnosis not present

## 2018-09-20 DIAGNOSIS — K219 Gastro-esophageal reflux disease without esophagitis: Secondary | ICD-10-CM | POA: Diagnosis not present

## 2018-09-20 DIAGNOSIS — I10 Essential (primary) hypertension: Secondary | ICD-10-CM | POA: Diagnosis not present

## 2018-09-20 DIAGNOSIS — M109 Gout, unspecified: Secondary | ICD-10-CM | POA: Diagnosis not present

## 2018-09-24 DIAGNOSIS — M109 Gout, unspecified: Secondary | ICD-10-CM | POA: Diagnosis not present

## 2018-09-24 DIAGNOSIS — N189 Chronic kidney disease, unspecified: Secondary | ICD-10-CM | POA: Diagnosis not present

## 2018-09-24 DIAGNOSIS — K219 Gastro-esophageal reflux disease without esophagitis: Secondary | ICD-10-CM | POA: Diagnosis not present

## 2018-09-24 DIAGNOSIS — I10 Essential (primary) hypertension: Secondary | ICD-10-CM | POA: Diagnosis not present

## 2018-09-25 DIAGNOSIS — N189 Chronic kidney disease, unspecified: Secondary | ICD-10-CM | POA: Diagnosis not present

## 2018-09-25 DIAGNOSIS — I10 Essential (primary) hypertension: Secondary | ICD-10-CM | POA: Diagnosis not present

## 2018-09-25 DIAGNOSIS — M109 Gout, unspecified: Secondary | ICD-10-CM | POA: Diagnosis not present

## 2018-09-25 DIAGNOSIS — K219 Gastro-esophageal reflux disease without esophagitis: Secondary | ICD-10-CM | POA: Diagnosis not present

## 2018-09-27 DIAGNOSIS — M109 Gout, unspecified: Secondary | ICD-10-CM | POA: Diagnosis not present

## 2018-09-27 DIAGNOSIS — K219 Gastro-esophageal reflux disease without esophagitis: Secondary | ICD-10-CM | POA: Diagnosis not present

## 2018-09-27 DIAGNOSIS — I10 Essential (primary) hypertension: Secondary | ICD-10-CM | POA: Diagnosis not present

## 2018-09-27 DIAGNOSIS — N189 Chronic kidney disease, unspecified: Secondary | ICD-10-CM | POA: Diagnosis not present

## 2018-10-01 DIAGNOSIS — M109 Gout, unspecified: Secondary | ICD-10-CM | POA: Diagnosis not present

## 2018-10-01 DIAGNOSIS — I10 Essential (primary) hypertension: Secondary | ICD-10-CM | POA: Diagnosis not present

## 2018-10-01 DIAGNOSIS — N189 Chronic kidney disease, unspecified: Secondary | ICD-10-CM | POA: Diagnosis not present

## 2018-10-01 DIAGNOSIS — K219 Gastro-esophageal reflux disease without esophagitis: Secondary | ICD-10-CM | POA: Diagnosis not present

## 2018-10-04 DIAGNOSIS — M109 Gout, unspecified: Secondary | ICD-10-CM | POA: Diagnosis not present

## 2018-10-04 DIAGNOSIS — K219 Gastro-esophageal reflux disease without esophagitis: Secondary | ICD-10-CM | POA: Diagnosis not present

## 2018-10-04 DIAGNOSIS — I10 Essential (primary) hypertension: Secondary | ICD-10-CM | POA: Diagnosis not present

## 2018-10-04 DIAGNOSIS — N189 Chronic kidney disease, unspecified: Secondary | ICD-10-CM | POA: Diagnosis not present

## 2018-10-08 DIAGNOSIS — N189 Chronic kidney disease, unspecified: Secondary | ICD-10-CM | POA: Diagnosis not present

## 2018-10-08 DIAGNOSIS — K219 Gastro-esophageal reflux disease without esophagitis: Secondary | ICD-10-CM | POA: Diagnosis not present

## 2018-10-08 DIAGNOSIS — I10 Essential (primary) hypertension: Secondary | ICD-10-CM | POA: Diagnosis not present

## 2018-10-08 DIAGNOSIS — M109 Gout, unspecified: Secondary | ICD-10-CM | POA: Diagnosis not present

## 2018-10-08 NOTE — Congregational Nurse Program (Signed)
Home visit with [place] RN,and interpreter Kathyrn Drown and Social worker Katie via [place].  Patient in very good spirits today, smiling and laughing.  He has been using a [place] oil with  Menthol, aspirin, and eucalyptus on his left arm and says the pain and swelling are much better.  Also reports no chest pain.  Has been taking a new medicine prescribed by [facility] doctor and it may be helping with breathing. He is not eating or drinking on a regular basis.Weight same as last week. Patient happy that he received stimulus money from the government.  He talked with social worker about his sick wife and daughter in [place]  States they both have heart problems but do not have access to proper care because they are [place]. [facility] will continue visiting patient on Tuesdays and Fridays.  [name] RN, Congregational Nurse [phone]

## 2018-10-11 DIAGNOSIS — N189 Chronic kidney disease, unspecified: Secondary | ICD-10-CM | POA: Diagnosis not present

## 2018-10-11 DIAGNOSIS — I10 Essential (primary) hypertension: Secondary | ICD-10-CM | POA: Diagnosis not present

## 2018-10-11 DIAGNOSIS — K219 Gastro-esophageal reflux disease without esophagitis: Secondary | ICD-10-CM | POA: Diagnosis not present

## 2018-10-11 DIAGNOSIS — M109 Gout, unspecified: Secondary | ICD-10-CM | POA: Diagnosis not present

## 2018-10-15 DIAGNOSIS — I10 Essential (primary) hypertension: Secondary | ICD-10-CM | POA: Diagnosis not present

## 2018-10-15 DIAGNOSIS — M109 Gout, unspecified: Secondary | ICD-10-CM | POA: Diagnosis not present

## 2018-10-15 DIAGNOSIS — N189 Chronic kidney disease, unspecified: Secondary | ICD-10-CM | POA: Diagnosis not present

## 2018-10-15 DIAGNOSIS — K219 Gastro-esophageal reflux disease without esophagitis: Secondary | ICD-10-CM | POA: Diagnosis not present

## 2018-10-17 DIAGNOSIS — M109 Gout, unspecified: Secondary | ICD-10-CM | POA: Diagnosis not present

## 2018-10-17 DIAGNOSIS — N189 Chronic kidney disease, unspecified: Secondary | ICD-10-CM | POA: Diagnosis not present

## 2018-10-17 DIAGNOSIS — K219 Gastro-esophageal reflux disease without esophagitis: Secondary | ICD-10-CM | POA: Diagnosis not present

## 2018-10-17 DIAGNOSIS — I10 Essential (primary) hypertension: Secondary | ICD-10-CM | POA: Diagnosis not present

## 2018-10-18 NOTE — Congregational Nurse Program (Signed)
Home visit with interpreter [place], Nurse Elmer and Social Worker Katie from hospice on phone.  Patient says he feels very tired and has no energy and is sleeping a lot.  No C/o pain or SOB. Has only taken 1 dose of morphine since Tuesday 10/15/2018.  Also not taking other medications for nausea even though he has been having frequent Nausea and vomiting.  Agreed to use nausea medicine as needed every 4 hours.  Roommate Sherley Bounds was shown how he should take medicine.  Social worker spoke with him about wishes for burial.  He declined option to go to Hospice in-patient care, stating he wants to stay home. Roommate knows how to contact interpreter in case of emergency.  [place] will then contact Hospice.  [name] RN, Congregational Nurse [phone]

## 2018-10-21 DIAGNOSIS — M109 Gout, unspecified: Secondary | ICD-10-CM | POA: Diagnosis not present

## 2018-10-21 DIAGNOSIS — I120 Hypertensive chronic kidney disease with stage 5 chronic kidney disease or end stage renal disease: Secondary | ICD-10-CM | POA: Diagnosis not present

## 2018-10-21 DIAGNOSIS — K219 Gastro-esophageal reflux disease without esophagitis: Secondary | ICD-10-CM | POA: Diagnosis not present

## 2018-10-21 DIAGNOSIS — N186 End stage renal disease: Secondary | ICD-10-CM | POA: Diagnosis not present

## 2018-10-21 DIAGNOSIS — R110 Nausea: Secondary | ICD-10-CM | POA: Diagnosis not present

## 2018-10-21 DIAGNOSIS — R600 Localized edema: Secondary | ICD-10-CM | POA: Diagnosis not present

## 2018-10-22 DIAGNOSIS — K219 Gastro-esophageal reflux disease without esophagitis: Secondary | ICD-10-CM | POA: Diagnosis not present

## 2018-10-22 DIAGNOSIS — R600 Localized edema: Secondary | ICD-10-CM | POA: Diagnosis not present

## 2018-10-22 DIAGNOSIS — I120 Hypertensive chronic kidney disease with stage 5 chronic kidney disease or end stage renal disease: Secondary | ICD-10-CM | POA: Diagnosis not present

## 2018-10-22 DIAGNOSIS — N186 End stage renal disease: Secondary | ICD-10-CM | POA: Diagnosis not present

## 2018-10-22 DIAGNOSIS — M109 Gout, unspecified: Secondary | ICD-10-CM | POA: Diagnosis not present

## 2018-10-22 DIAGNOSIS — R110 Nausea: Secondary | ICD-10-CM | POA: Diagnosis not present

## 2018-10-25 DIAGNOSIS — K219 Gastro-esophageal reflux disease without esophagitis: Secondary | ICD-10-CM | POA: Diagnosis not present

## 2018-10-25 DIAGNOSIS — M109 Gout, unspecified: Secondary | ICD-10-CM | POA: Diagnosis not present

## 2018-10-25 DIAGNOSIS — R110 Nausea: Secondary | ICD-10-CM | POA: Diagnosis not present

## 2018-10-25 DIAGNOSIS — R600 Localized edema: Secondary | ICD-10-CM | POA: Diagnosis not present

## 2018-10-25 DIAGNOSIS — I120 Hypertensive chronic kidney disease with stage 5 chronic kidney disease or end stage renal disease: Secondary | ICD-10-CM | POA: Diagnosis not present

## 2018-10-25 DIAGNOSIS — N186 End stage renal disease: Secondary | ICD-10-CM | POA: Diagnosis not present

## 2018-10-29 DIAGNOSIS — I120 Hypertensive chronic kidney disease with stage 5 chronic kidney disease or end stage renal disease: Secondary | ICD-10-CM | POA: Diagnosis not present

## 2018-10-29 DIAGNOSIS — M109 Gout, unspecified: Secondary | ICD-10-CM | POA: Diagnosis not present

## 2018-10-29 DIAGNOSIS — K219 Gastro-esophageal reflux disease without esophagitis: Secondary | ICD-10-CM | POA: Diagnosis not present

## 2018-10-29 DIAGNOSIS — R110 Nausea: Secondary | ICD-10-CM | POA: Diagnosis not present

## 2018-10-29 DIAGNOSIS — R600 Localized edema: Secondary | ICD-10-CM | POA: Diagnosis not present

## 2018-10-29 DIAGNOSIS — N186 End stage renal disease: Secondary | ICD-10-CM | POA: Diagnosis not present

## 2018-11-01 DIAGNOSIS — R110 Nausea: Secondary | ICD-10-CM | POA: Diagnosis not present

## 2018-11-01 DIAGNOSIS — K219 Gastro-esophageal reflux disease without esophagitis: Secondary | ICD-10-CM | POA: Diagnosis not present

## 2018-11-01 DIAGNOSIS — N186 End stage renal disease: Secondary | ICD-10-CM | POA: Diagnosis not present

## 2018-11-01 DIAGNOSIS — I120 Hypertensive chronic kidney disease with stage 5 chronic kidney disease or end stage renal disease: Secondary | ICD-10-CM | POA: Diagnosis not present

## 2018-11-01 DIAGNOSIS — M109 Gout, unspecified: Secondary | ICD-10-CM | POA: Diagnosis not present

## 2018-11-01 DIAGNOSIS — R600 Localized edema: Secondary | ICD-10-CM | POA: Diagnosis not present

## 2018-11-04 DIAGNOSIS — K219 Gastro-esophageal reflux disease without esophagitis: Secondary | ICD-10-CM | POA: Diagnosis not present

## 2018-11-04 DIAGNOSIS — I120 Hypertensive chronic kidney disease with stage 5 chronic kidney disease or end stage renal disease: Secondary | ICD-10-CM | POA: Diagnosis not present

## 2018-11-04 DIAGNOSIS — N186 End stage renal disease: Secondary | ICD-10-CM | POA: Diagnosis not present

## 2018-11-04 DIAGNOSIS — R110 Nausea: Secondary | ICD-10-CM | POA: Diagnosis not present

## 2018-11-04 DIAGNOSIS — R600 Localized edema: Secondary | ICD-10-CM | POA: Diagnosis not present

## 2018-11-04 DIAGNOSIS — M109 Gout, unspecified: Secondary | ICD-10-CM | POA: Diagnosis not present

## 2018-11-06 DIAGNOSIS — I120 Hypertensive chronic kidney disease with stage 5 chronic kidney disease or end stage renal disease: Secondary | ICD-10-CM | POA: Diagnosis not present

## 2018-11-06 DIAGNOSIS — N186 End stage renal disease: Secondary | ICD-10-CM | POA: Diagnosis not present

## 2018-11-06 DIAGNOSIS — R110 Nausea: Secondary | ICD-10-CM | POA: Diagnosis not present

## 2018-11-06 DIAGNOSIS — M109 Gout, unspecified: Secondary | ICD-10-CM | POA: Diagnosis not present

## 2018-11-06 DIAGNOSIS — R600 Localized edema: Secondary | ICD-10-CM | POA: Diagnosis not present

## 2018-11-06 DIAGNOSIS — K219 Gastro-esophageal reflux disease without esophagitis: Secondary | ICD-10-CM | POA: Diagnosis not present

## 2018-11-08 DIAGNOSIS — R110 Nausea: Secondary | ICD-10-CM | POA: Diagnosis not present

## 2018-11-08 DIAGNOSIS — K219 Gastro-esophageal reflux disease without esophagitis: Secondary | ICD-10-CM | POA: Diagnosis not present

## 2018-11-08 DIAGNOSIS — I120 Hypertensive chronic kidney disease with stage 5 chronic kidney disease or end stage renal disease: Secondary | ICD-10-CM | POA: Diagnosis not present

## 2018-11-08 DIAGNOSIS — R600 Localized edema: Secondary | ICD-10-CM | POA: Diagnosis not present

## 2018-11-08 DIAGNOSIS — M109 Gout, unspecified: Secondary | ICD-10-CM | POA: Diagnosis not present

## 2018-11-08 DIAGNOSIS — N186 End stage renal disease: Secondary | ICD-10-CM | POA: Diagnosis not present

## 2018-11-09 DIAGNOSIS — R110 Nausea: Secondary | ICD-10-CM | POA: Diagnosis not present

## 2018-11-09 DIAGNOSIS — M109 Gout, unspecified: Secondary | ICD-10-CM | POA: Diagnosis not present

## 2018-11-09 DIAGNOSIS — I120 Hypertensive chronic kidney disease with stage 5 chronic kidney disease or end stage renal disease: Secondary | ICD-10-CM | POA: Diagnosis not present

## 2018-11-09 DIAGNOSIS — R600 Localized edema: Secondary | ICD-10-CM | POA: Diagnosis not present

## 2018-11-09 DIAGNOSIS — K219 Gastro-esophageal reflux disease without esophagitis: Secondary | ICD-10-CM | POA: Diagnosis not present

## 2018-11-09 DIAGNOSIS — N186 End stage renal disease: Secondary | ICD-10-CM | POA: Diagnosis not present

## 2018-11-10 DIAGNOSIS — M109 Gout, unspecified: Secondary | ICD-10-CM | POA: Diagnosis not present

## 2018-11-10 DIAGNOSIS — K219 Gastro-esophageal reflux disease without esophagitis: Secondary | ICD-10-CM | POA: Diagnosis not present

## 2018-11-10 DIAGNOSIS — R600 Localized edema: Secondary | ICD-10-CM | POA: Diagnosis not present

## 2018-11-10 DIAGNOSIS — N186 End stage renal disease: Secondary | ICD-10-CM | POA: Diagnosis not present

## 2018-11-10 DIAGNOSIS — I120 Hypertensive chronic kidney disease with stage 5 chronic kidney disease or end stage renal disease: Secondary | ICD-10-CM | POA: Diagnosis not present

## 2018-11-10 DIAGNOSIS — R110 Nausea: Secondary | ICD-10-CM | POA: Diagnosis not present

## 2018-11-11 DIAGNOSIS — N186 End stage renal disease: Secondary | ICD-10-CM | POA: Diagnosis not present

## 2018-11-11 DIAGNOSIS — R600 Localized edema: Secondary | ICD-10-CM | POA: Diagnosis not present

## 2018-11-11 DIAGNOSIS — R110 Nausea: Secondary | ICD-10-CM | POA: Diagnosis not present

## 2018-11-11 DIAGNOSIS — I120 Hypertensive chronic kidney disease with stage 5 chronic kidney disease or end stage renal disease: Secondary | ICD-10-CM | POA: Diagnosis not present

## 2018-11-11 DIAGNOSIS — M109 Gout, unspecified: Secondary | ICD-10-CM | POA: Diagnosis not present

## 2018-11-11 DIAGNOSIS — K219 Gastro-esophageal reflux disease without esophagitis: Secondary | ICD-10-CM | POA: Diagnosis not present

## 2018-11-12 DIAGNOSIS — I120 Hypertensive chronic kidney disease with stage 5 chronic kidney disease or end stage renal disease: Secondary | ICD-10-CM | POA: Diagnosis not present

## 2018-11-12 DIAGNOSIS — R600 Localized edema: Secondary | ICD-10-CM | POA: Diagnosis not present

## 2018-11-12 DIAGNOSIS — R110 Nausea: Secondary | ICD-10-CM | POA: Diagnosis not present

## 2018-11-12 DIAGNOSIS — N186 End stage renal disease: Secondary | ICD-10-CM | POA: Diagnosis not present

## 2018-11-12 DIAGNOSIS — M109 Gout, unspecified: Secondary | ICD-10-CM | POA: Diagnosis not present

## 2018-11-12 DIAGNOSIS — K219 Gastro-esophageal reflux disease without esophagitis: Secondary | ICD-10-CM | POA: Diagnosis not present

## 2018-11-13 DIAGNOSIS — R110 Nausea: Secondary | ICD-10-CM | POA: Diagnosis not present

## 2018-11-13 DIAGNOSIS — I120 Hypertensive chronic kidney disease with stage 5 chronic kidney disease or end stage renal disease: Secondary | ICD-10-CM | POA: Diagnosis not present

## 2018-11-13 DIAGNOSIS — M109 Gout, unspecified: Secondary | ICD-10-CM | POA: Diagnosis not present

## 2018-11-13 DIAGNOSIS — K219 Gastro-esophageal reflux disease without esophagitis: Secondary | ICD-10-CM | POA: Diagnosis not present

## 2018-11-13 DIAGNOSIS — R600 Localized edema: Secondary | ICD-10-CM | POA: Diagnosis not present

## 2018-11-13 DIAGNOSIS — N186 End stage renal disease: Secondary | ICD-10-CM | POA: Diagnosis not present

## 2018-11-18 ENCOUNTER — Telehealth: Payer: Self-pay

## 2018-11-18 NOTE — Telephone Encounter (Signed)
Nurse Arbie Cookey called to state that patient has expired at [facility].  [name] RN, Congregational Nurse [phone]

## 2018-11-20 DEATH — deceased
